# Patient Record
Sex: Male | Born: 1945 | Race: Black or African American | Hispanic: No | Marital: Married | Smoking: Former smoker
Health system: Southern US, Community
[De-identification: ages and names within clinical notes are randomized; demographics above are authoritative.]

## PROBLEM LIST (undated history)

## (undated) DIAGNOSIS — I10 Essential (primary) hypertension: Secondary | ICD-10-CM

## (undated) DIAGNOSIS — M1990 Unspecified osteoarthritis, unspecified site: Secondary | ICD-10-CM

## (undated) DIAGNOSIS — K5731 Diverticulosis of large intestine without perforation or abscess with bleeding: Secondary | ICD-10-CM

## (undated) DIAGNOSIS — E119 Type 2 diabetes mellitus without complications: Secondary | ICD-10-CM

## (undated) DIAGNOSIS — H269 Unspecified cataract: Secondary | ICD-10-CM

## (undated) DIAGNOSIS — K922 Gastrointestinal hemorrhage, unspecified: Secondary | ICD-10-CM

## (undated) DIAGNOSIS — Z972 Presence of dental prosthetic device (complete) (partial): Secondary | ICD-10-CM

## (undated) DIAGNOSIS — I639 Cerebral infarction, unspecified: Secondary | ICD-10-CM

## (undated) DIAGNOSIS — D649 Anemia, unspecified: Secondary | ICD-10-CM

## (undated) DIAGNOSIS — N400 Enlarged prostate without lower urinary tract symptoms: Secondary | ICD-10-CM

## (undated) DIAGNOSIS — R0600 Dyspnea, unspecified: Secondary | ICD-10-CM

## (undated) DIAGNOSIS — E785 Hyperlipidemia, unspecified: Secondary | ICD-10-CM

## (undated) DIAGNOSIS — Z789 Other specified health status: Secondary | ICD-10-CM

## (undated) DIAGNOSIS — Z973 Presence of spectacles and contact lenses: Secondary | ICD-10-CM

## (undated) DIAGNOSIS — K635 Polyp of colon: Secondary | ICD-10-CM

## (undated) HISTORY — PX: JOINT REPLACEMENT: SHX530

## (undated) HISTORY — DX: Hyperlipidemia, unspecified: E78.5

## (undated) HISTORY — DX: Polyp of colon: K63.5

## (undated) HISTORY — DX: Type 2 diabetes mellitus without complications: E11.9

## (undated) HISTORY — DX: Unspecified cataract: H26.9

## (undated) HISTORY — DX: Unspecified osteoarthritis, unspecified site: M19.90

## (undated) HISTORY — PX: CATARACT EXTRACTION: SUR2

## (undated) HISTORY — DX: Cerebral infarction, unspecified: I63.9

## (undated) HISTORY — DX: Essential (primary) hypertension: I10

## (undated) HISTORY — PX: KNEE CARTILAGE SURGERY: SHX688

## (undated) HISTORY — DX: Diverticulosis of large intestine without perforation or abscess with bleeding: K57.31

---

## 2005-04-16 HISTORY — PX: HERNIA REPAIR: SHX51

## 2005-04-16 HISTORY — PX: TOTAL HIP ARTHROPLASTY: SHX124

## 2005-08-08 ENCOUNTER — Inpatient Hospital Stay (HOSPITAL_COMMUNITY): Admission: RE | Admit: 2005-08-08 | Discharge: 2005-08-11 | Payer: Self-pay | Admitting: Orthopedic Surgery

## 2005-10-22 ENCOUNTER — Ambulatory Visit (HOSPITAL_COMMUNITY): Admission: RE | Admit: 2005-10-22 | Discharge: 2005-10-22 | Payer: Self-pay | Admitting: General Surgery

## 2007-11-27 ENCOUNTER — Encounter: Payer: Self-pay | Admitting: Family Medicine

## 2008-10-27 ENCOUNTER — Ambulatory Visit: Payer: Self-pay | Admitting: Family Medicine

## 2008-10-27 DIAGNOSIS — E119 Type 2 diabetes mellitus without complications: Secondary | ICD-10-CM

## 2008-10-27 DIAGNOSIS — I10 Essential (primary) hypertension: Secondary | ICD-10-CM

## 2008-10-27 DIAGNOSIS — M1990 Unspecified osteoarthritis, unspecified site: Secondary | ICD-10-CM | POA: Insufficient documentation

## 2008-10-27 DIAGNOSIS — E1165 Type 2 diabetes mellitus with hyperglycemia: Secondary | ICD-10-CM | POA: Insufficient documentation

## 2008-10-27 HISTORY — DX: Type 2 diabetes mellitus without complications: E11.9

## 2008-10-27 HISTORY — DX: Essential (primary) hypertension: I10

## 2009-01-26 ENCOUNTER — Ambulatory Visit: Payer: Self-pay | Admitting: Family Medicine

## 2009-01-26 LAB — CONVERTED CEMR LAB: Hgb A1c MFr Bld: 5.6 % (ref 4.6–6.5)

## 2009-04-27 ENCOUNTER — Ambulatory Visit: Payer: Self-pay | Admitting: Family Medicine

## 2009-04-27 DIAGNOSIS — E785 Hyperlipidemia, unspecified: Secondary | ICD-10-CM | POA: Insufficient documentation

## 2009-04-27 HISTORY — DX: Hyperlipidemia, unspecified: E78.5

## 2009-07-20 ENCOUNTER — Ambulatory Visit: Payer: Self-pay | Admitting: Family Medicine

## 2009-07-21 LAB — CONVERTED CEMR LAB
Albumin: 4.2 g/dL (ref 3.5–5.2)
Bilirubin, Direct: 0.1 mg/dL (ref 0.0–0.3)
Chloride: 105 meq/L (ref 96–112)
Cholesterol: 201 mg/dL — ABNORMAL HIGH (ref 0–200)
Hgb A1c MFr Bld: 5.4 % (ref 4.6–6.5)
VLDL: 8.8 mg/dL (ref 0.0–40.0)

## 2009-07-27 ENCOUNTER — Ambulatory Visit: Payer: Self-pay | Admitting: Family Medicine

## 2010-03-01 ENCOUNTER — Telehealth: Payer: Self-pay | Admitting: Family Medicine

## 2010-05-16 NOTE — Assessment & Plan Note (Signed)
Summary: 3 MNTH ROV//SLM   Vital Signs:  Patient profile:   65 year old male Weight:      249 pounds Temp:     98.4 degrees F BP sitting:   120 / 82  History of Present Illness: Followup medical problems. Patient has type 2 diabetes and fasting blood sugars been excellent mostly below 90. No hypoglycemia. On metformin 500 mg b.i.d. Last A1c 5.6%. Exercising 6 days per week. Weight stable and still losing inches around the waist. Has lost over 4 inches in waist circumference this year. Blood sugars consistently stable. He would like to consider stopping medication this point if possible.  Hypertension treated with lisinopril 10 mg. No history of significant proteinuria. We reduced medication last visit. Blood pressures have been stable. Denies any dizziness or orthostatic symptoms. No side effects from medication.  Mild  hyperlipidemia.  Last lipids in 2009.  No lipids since his weight loss and lifestyle changes.  Allergies: No Known Drug Allergies  Past History:  Past Medical History: Last updated: 10/27/2008 Hypertension Diabetes  Past Surgical History: Last updated: 10/27/2008 Hip replacement 4/07 Hernia 8/07  Social History: Last updated: 10/27/2008 Retired Married Alcohol use-no Previous smoker Regular exercise-yes  Review of Systems  The patient denies anorexia, fever, chest pain, syncope, dyspnea on exertion, peripheral edema, prolonged cough, headaches, hemoptysis, abdominal pain, melena, hematochezia, severe indigestion/heartburn, and incontinence.    Physical Exam  General:  Well-developed,well-nourished,in no acute distress; alert,appropriate and cooperative throughout examination Head:  Normocephalic and atraumatic without obvious abnormalities. No apparent alopecia or balding. Eyes:  No corneal or conjunctival inflammation noted. EOMI. Perrla. Funduscopic exam benign, without hemorrhages, exudates or papilledema. Vision grossly normal. Ears:  External ear  exam shows no significant lesions or deformities.  Otoscopic examination reveals clear canals, tympanic membranes are intact bilaterally without bulging, retraction, inflammation or discharge. Hearing is grossly normal bilaterally. Mouth:  Oral mucosa and oropharynx without lesions or exudates.  Teeth in good repair. Neck:  No deformities, masses, or tenderness noted. Lungs:  Normal respiratory effort, chest expands symmetrically. Lungs are clear to auscultation, no crackles or wheezes. Heart:  Normal rate and regular rhythm. S1 and S2 normal without gallop, murmur, click, rub or other extra sounds. Extremities:  No clubbing, cyanosis, edema, or deformity noted with normal full range of motion of all joints.     Impression & Recommendations:  Problem # 1:  HYPERTENSION (ICD-401.9) discontinue lisinopril and recheck blood pressure 3 months The following medications were removed from the medication list:    Lisinopril 10 Mg Tabs (Lisinopril) ..... One by mouth once daily  Problem # 2:  DIAB W/O COMP TYPE II/UNS NOT STATED UNCNTRL (ICD-250.00) we'll discontinue metformin and recheck A1c in 3 months The following medications were removed from the medication list:    Lisinopril 10 Mg Tabs (Lisinopril) ..... One by mouth once daily    Metformin Hcl 500 Mg Tabs (Metformin hcl) .Marland Kitchen... 2 by mouth q am and 1 by mouth q pm  Problem # 3:  HYPERLIPIDEMIA (ICD-272.4) recheck lipids at fasting labs in 3 months.  Patient Instructions: 1)  Please schedule a follow-up appointment in 3 months .  2)  BMP prior to visit, ICD-9: 401.9 3)  Hepatic Panel prior to visit ICD-9: 272.4 4)  Lipid panel prior to visit ICD-9 : 272.4 5)  HgBA1c prior to visit  ICD-9: 250.00 6)  It is important that you exercise reguarly at least 20 minutes 5 times a week. If you develop chest  pain, have severe difficulty breathing, or feel very tired, stop exercising immediately and seek medical attention.  7)  Check your blood sugars  regularly. If your readings are usually above:  or below 70 you should contact our office.  8)  See your eye doctor yearly to check for diabetic eye damage.

## 2010-05-16 NOTE — Progress Notes (Signed)
Summary: Call place to pt, explain form faxed?  Phone Note Outgoing Call   Call placed by: Sid Falcon LPN,  March 01, 2010 5:07 PM Call placed to: Patient Summary of Call: Received a faxed form, unsure what to do with it.  Wanting to ask pt if he knows about it.  Looks like a request for medical redords?  We do not have permission from him, release? Initial call taken by: Sid Falcon LPN,  March 01, 2010 5:09 PM  Follow-up for Phone Call        [facility] Sid Falcon LPN  March 02, 2010 10:50 AM   Pt did call back, EMSI does not mean anything to him either.  He did apply for additional life insurance and he will call them to see if they faxed something to Dr Caryl Never.  Discarded the form, we need written authorization to release medical records. Follow-up by: Sid Falcon LPN,  March 02, 2010 4:29 PM     Appended Document: Call place to pt, explain form faxed? Pt did call his insurance Co and we did received the form he needs for medical records.  Pt will come by office and sign a release of medical records form.  Medical records will  fax records.

## 2010-05-16 NOTE — Assessment & Plan Note (Signed)
Summary: 3 month rov/njr   Vital Signs:  Patient profile:   65 year old male Height:      72.25 inches Weight:      226 pounds Temp:     98.6 degrees F oral BP sitting:   120 / 82  (left arm) Cuff size:   large  Vitals Entered By: Sid Falcon LPN (July 27, 2009 8:30 AM) CC: 3 month follow-up   History of Present Illness: Patient for followup. Prior history of hyperlipidemia, hypertension, and type 2 diabetes. Tremendous job with lifestyle modification. Has lost over 60 pounds over the past 2 years and is exercising 2 and one half hours per day and has come off all medications. No recent urine frequency or thirst. Blood pressure stable. Feels very good overall. Positive dietary changes as well.  Recent labs reviewed hemoglobin A1c 5.4%. Good lipid profile with HDL 66. Total cholesterol 201. Normal triglycerides. Glucose 83  Allergies (verified): No Known Drug Allergies  Past History:  Past Medical History: Last updated: 10/27/2008 Hypertension Diabetes  Past Surgical History: Last updated: 10/27/2008 Hip replacement 4/07 Hernia 8/07  Review of Systems  The patient denies anorexia, chest pain, syncope, dyspnea on exertion, peripheral edema, prolonged cough, headaches, hemoptysis, abdominal pain, and melena.    Physical Exam  General:  Well-developed,well-nourished,in no acute distress; alert,appropriate and cooperative throughout examination Neck:  No deformities, masses, or tenderness noted. Lungs:  Normal respiratory effort, chest expands symmetrically. Lungs are clear to auscultation, no crackles or wheezes. Heart:  Normal rate and regular rhythm. S1 and S2 normal without gallop, murmur, click, rub or other extra sounds.   Impression & Recommendations:  Problem # 1:  HYPERTENSION (ICD-401.9) Assessment Improved basically his chronic medical problems have resolved with tremendous lifestyle change.  Problem # 2:  DIAB W/O COMP TYPE II/UNS NOT STATED UNCNTRL  (ICD-250.00) Assessment: Improved no indication for any further meds.

## 2010-09-01 NOTE — Op Note (Signed)
NAMESHED, NIXON             ACCOUNT NO.:  [id number]   MEDICAL RECORD NO.:  [id number]          PATIENT TYPE:  AMB   LOCATION:  DAY                          FACILITY:  [facility]   PHYSICIAN:  Gita Kudo, M.D. DATE OF BIRTH:  [birth year]   DATE OF PROCEDURE:  10/22/2005  DATE OF DISCHARGE:                                 OPERATIVE REPORT   OPERATIVE PROCEDURE:  Repair umbilical hernia with mesh - Ventralex, 8 cm.   SURGEON:  Gita Kudo, M.D.   ANESTHESIA:  General.   PREOPERATIVE DIAGNOSIS:  Umbilical hernia.   POSTOPERATIVE DIAGNOSIS:  Umbilical hernia.   CLINICAL SUMMARY:  65 year old male with symptomatic enlarging umbilical  hernia who comes in for elective repair.  He was seen in the office several  months ago but was on Coumadin for phlebitis and we decided to make sure he  had his full 6 months before we treated him.  His Coumadin and aspirin were  stopped 5 days before surgery.   OPERATIVE FINDINGS:  The patient is a large umbilical hernia with about a 4  cm defect.  The 8 cm diameter Ventralex mesh was used.   OPERATIVE PROCEDURE:  Under satisfactory general endotracheal anesthesia,  having received 1.0 grams Ancef preop, the patient was prepped, positioned  and draped in standard fashion.  A total of 30 mL of 0.5% Marcaine with  epinephrine was infiltrated for postop analgesia.  A curved infraumbilical  incision was made and carried down to the fascia.  The umbilical skin was  elevated off the abdominal wall and the hernia sac freed in all directions.  The hernia sac - fascial junction was then circumscribed with a cautery into  the preperitoneal space.  Finger dissection used to reduce the preperitoneal  contents, some of which were actually up and stuck in the hernia sac.  Then  with good exposure, the 8 cm piece of Ventralex mesh was placed in the  preperitoneal space.  It was anchored with four quadrant sutures of zero  Prolene through the  undersurface of the abdominal wall and the upper surface  of the mesh.  After tied, the mesh was in good position.  Then four  additional sutures were used in between the others to make a total of eight  sutures around the periphery.  This held the mesh in excellent position.  Following this the wound was lavaged with saline and the remainder of the  Marcaine infiltrated.  The fascial defect was then closed in a transverse  direction with interrupted figure-of-eight 0 Prolene suture taking  intermittent bites of the mesh again holding it in  good position.  The wound was then checked for hemostasis, made dry by  cautery, lavaged with saline and closed in layers with 2-0 Vicryl, 3-0  Vicryl, Steri-Strips.  Sterile absorbent dressings were then applied.  The  patient went to the recovery room from the operating room in good condition.           ______________________________  Gita Kudo, M.D.     MRL/MEDQ  D:  10/22/2005  T:  10/22/2005  Job:  [id number]   cc:   Teena Irani. Arlyce Dice, M.D.  Fax: [phone]

## 2010-09-01 NOTE — Discharge Summary (Signed)
Carl Lam, Carl Lam             ACCOUNT NO.:  [id number]   MEDICAL RECORD NO.:  [id number]          PATIENT TYPE:  INP   LOCATION:  1619                         FACILITY:  [facility]   PHYSICIAN:  Georges Lynch. Gioffre, M.D.DATE OF BIRTH:  1945-05-05   DATE OF ADMISSION:  08/08/2005  DATE OF DISCHARGE:  08/11/2005                                 DISCHARGE SUMMARY   ADMITTING DIAGNOSES:  1.  End-stage osteoarthritis right hip.  2.  History of umbilical hernia.   DISCHARGE DIAGNOSES:  1.  Right total hip arthroplasty.  2.  Asymptomatic postoperative blood loss anemia.  3.  History of umbilical hernia.   HISTORY OF PRESENT ILLNESS:  Patient is a 65 year old male with severe pain  and loss of range of motion of his right hip.  He has difficulty with  ambulating, getting up and down stairs.  Presents to our office for  evaluation which shows end-stage osteoarthritis with deformed acetabular and  femoral head components.   ALLERGIES:  No known drug allergies.   CURRENT MEDICATIONS ON ADMISSION:  Vicodin on a p.r.n. basis.   SURGICAL PROCEDURE:  On August 08, 2005 patient was taken to the OR by Dr.  Worthy Rancher assisted by Dr. Lajoyce Corners.  Under general anesthesia underwent a  right total hip arthroplasty without any complications.  Patient tolerated  the procedure well.  Had the following components implanted.  A sized 7 high  offset tapered hip stem with Duofix HA.  A size +5 tapered sleeve adaptor.  A size 49 femoral implant.  A size 56 outside diameter acetabular cup.  Patient tolerated the procedure, was transferred to the recovery room and  then the orthopedic floor for routine total hip protocol.   CONSULTS:  The following routine consult were requested:  Physical therapy,  occupational therapy, case management, pharmacy for Coumadin management.   HOSPITAL COURSE:  On August 08, 2005 patient was admitted to [facility] under the care of Dr. Worthy Rancher.  Patient was taken  to the OR  where a right total hip arthroplasty was performed without any  complications.  Patient was transferred to the recovery room and to the  orthopedic floor in good condition on IV medications, antibiotics, pain  medications, and Coumadin and heparin for DVT prophylaxis and started on a  routine total hip protocol.   Patient then incurred a total of three days postoperative care which the  patient incurred no significant medical issues.  Patient's vital signs  remained stable.  He remained afebrile.  His hemoglobin did drop to 9.8 but  patient tolerated that well with his vital signs and his activity with  physical therapy.  His wound remained benign for any signs of infection.  His leg remained neuro/motor vascularly intact.  Patient worked well with  physical therapy and it was felt that on postoperative day #3 he was  orthopedically and medically stable and was ready for discharge home for  outpatient home health physical therapy for total hip protocol.  Patient was  discharged in good condition.   LABORATORIES:  H&H on April 28 hemoglobin 9.8,  hematocrit 29.7 with an INR  of 1.4.  Routine chemistries on April 28 found sodium 138, potassium 4.3,  glucose 256 with no indications and changes in his diabetic regime which was  Glipizide which was started just prior to his admission.  BUN 11, creatinine  1.1.  Routine urinalysis on prior to admission found glucose greater than  1000 and his preoperative glucose was 285 which was noted prior to surgery  and the patient was evaluated by his primary care physician prior to surgery  and started him on Glipizide within two days of his surgery which would  explain for his elevated glucose.  Otherwise, he was a previously non-  diagnosed diabetic.   DISCHARGE MEDICATIONS:  1.  Ferrous sulfate 325 mg p.o. t.i.d.  2.  Percocet one or two tablets every four to six hours p.r.n.  3.  Reglan 10 mg p.o. q.8h. p.r.n.  4.  Colace 100 mg p.o.  b.i.d.  5.  Glipizide 5 mg p.o. daily.  6.  Coumadin 10 mg a day.  7.  Heparin 5000 units subcutaneous q.12h. until discharged.  8.  Routine insulin sliding scale daily.   DISCHARGE INSTRUCTIONS:   DIET:  Restricted 1800 calorie diabetic diet.   ACTIVITY:  Patient is to ambulate with the use of a walker as directed by  physical therapy.   WOUND CARE:  Patient is to change dressing daily.   MEDICATIONS:  Patient is to continue medications as dispensed prior to his  admission into the hospital including Glipizide and Coumadin at 10 mg dose  on the date of discharge, 7.5 mg the day after discharge, and then 5 mg the  day thereafter unless changed by pharmacy.   FOLLOW-UP:  With Dr. Darrelyn Hillock [phone] extension [phone] for two weeks from date  of surgery.  Home health with Genevieve Norlander for pro time and physical therapy.   Patient had a previous follow-up appointment arranged with his primary care  physician to monitor the recent diagnosis of diabetes.   Patient's condition upon discharge to home is listed as improved and good.      [name], P.A.    ______________________________  Georges Lynch Darrelyn Hillock, M.D.    RWK/MEDQ  D:  08/20/2005  T:  08/21/2005  Job:  [id number]

## 2010-09-01 NOTE — Op Note (Signed)
Carl Lam, Carl Lam             ACCOUNT NO.:  [id number]   MEDICAL RECORD NO.:  [id number]          PATIENT TYPE:  INP   LOCATION:  0007                         FACILITY:  [facility]   PHYSICIAN:  Georges Lynch. Gioffre, M.D.DATE OF BIRTH:  11/11/1945   DATE OF PROCEDURE:  08/08/2005  DATE OF DISCHARGE:                                 OPERATIVE REPORT   PREOPERATIVE DIAGNOSIS:  Degenerative arthritis right hip, severe.   POSTOPERATIVE DIAGNOSIS:  Degenerative arthritis right hip, severe.   OPERATION:  Right total hip arthroplasty utilizing the DePuy system.  The  acetabular cup was an ASR size 56.  The unifemoral head was a size 49.  The  stem was a size 7 high offset and the taper sleeve was a +5.   SURGEON:  Georges Lynch. Darrelyn Hillock, M.D.   ASSISTANT:  Madlyn Frankel. Charlann Boxer, M.D.   DESCRIPTION OF PROCEDURE:  Under general anesthesia, routine orthopedic prep  and draping of the right hip was carried out.  Patient had 1 g IV Ancef  preoperatively.  A posterolateral approach of the hip was carried out with  the patient on his left side, right side up.  Bleeders were identified and  cauterized.  Self-retaining retractors were inserted.  The incision was  carried down to the iliotibial band.  We then incised the iliotibial band  and then incised the gluteal muscle by blunt dissection.  Great care was  taken not to injure the underlying nerve.  We then advanced the retractors.  I partially detached the external rotators.  I detached the piriformis.  I  then did a capsulectomy.  The bony overgrowth of the acetabulum was so  severe that we had to take an osteotome first and do a partial ostectomy of  the acetabulum.  We then were able to dislocate the femoral head.  We  amputated the femoral head at the appropriate neck length utilizing the  guide.  Following this, we then reamed and rasped out the femoral canal to a  size for a Summit prosthesis.  Once this was carried out, we then directed  attention  to the acetabulum, reamed the acetabulum up to the appropriate  size for a size 56 mm ASR metal cup.  We irrigated out the acetabulum after  we completed the removal of the spurs.  We then inserted our permanent cup  in the usual fashion.  We then went through trials for leg length and  position.  We then inserted our permanent size 7 Summit stem and went  through trials once again with a +2 and a +5. We felt the +5 C tapered head  component was the most stable.  We then inserted our permanent 549  unifemoral component and reduced the hip after we made sure there were no  loose fragments in the acetabulum.  We had excellent function of the hip,  excellent leg length.  The hip was stable.  We thoroughly irrigated out the  area and closed the wound in layers in usual fashion.  Skin was closed with  metal staples.  Sterile neosporin dressing was applied.  ______________________________  Georges Lynch Darrelyn Hillock, M.D.     RAG/MEDQ  D:  08/08/2005  T:  08/09/2005  Job:  [id number]

## 2010-09-01 NOTE — H&P (Signed)
Carl Lam, DOYON             ACCOUNT NO.:  [id number]   MEDICAL RECORD NO.:  [id number]          Carl Lam TYPE:  INP   LOCATION:  NA                           FACILITY:  [facility]   PHYSICIAN:  Georges Lynch. Gioffre, M.D.DATE OF BIRTH:  [birth year]   DATE OF ADMISSION:  08/08/2005  DATE OF DISCHARGE:                                HISTORY & PHYSICAL   CHIEF COMPLAINT:  Painful loss of range of motion right hip.   HISTORY OF PRESENT ILLNESS:  Carl Lam is a 65 year old black male with severe  pain and loss of range of motion of his right hip.  Carl Lam has difficulty  ambulating, difficulty getting up and down stairs.  Presented to our office.  Evaluation shows complete end-stage osteoarthritis with deformed acetabular  and femoral head on the right hip.   ALLERGIES:  NO KNOWN DRUG ALLERGIES.   CURRENT MEDICATION:  Vicodin on p.r.n. basis.   PAST MEDICAL HISTORY:  1.  Currently has a small umbilical hernia.  2.  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  3.  Otherwise healthy.   PAST SURGICAL HISTORY:  Left knee open ligament repair without any  complications.   SOCIAL HISTORY:  Carl Lam is married, healthy.  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  Carl Lam  denies any smoking.  Carl Lam stopped 20 years ago.  Carl Lam denies any alcohol use.  Carl Lam stopped 22 years ago.  Carl Lam has four children.  Lives with his wife in a  house, two stories.   FAMILY HISTORY:  Mother is alive at 62 years of age, good health.  Father is  deceased from cardiac and ETOH issues.  One brother alive in good health.  One sister alive in good health.   PRIMARY CARE PHYSICIAN:  Evelena Peat, M.D., [facility].   REVIEW OF SYSTEMS:  Positive for occasional weak urinary stream, positive  for right hip pain with range of motion, loss of motion.  Carl Lam denies any  neurologic, pulmonary, cardiovascular, GI, GU, hematologic or endocrine  issues at this time.   PHYSICAL EXAMINATION:  GENERAL APPEARANCE:  This is a healthy  appearing,  well-developed 65 year old black male, conscious, alert and appropriate.  Ambulates with moderate right-sided limp.  Easily gets himself on and off  the exam table.  VITAL SIGNS:  Carl Lam is 6 foot 3 inches, 245 pounds, blood pressure 138/94,  pulse 76, respirations 12, Carl Lam is afebrile.  HEENT:  Head is normocephalic.  Pupils are equal, round and reactive to  light.  Extraocular movements intact.  Gross hearing is intact.  Oral buccal  mucosa is pink and moist.  NECK:  Supple.  No palpable lymphadenopathy.  Carl Lam had good range of motion of  his cervical spine without any difficulty.  CHEST:  Lung sounds were clear and equal bilaterally.  No wheezing, rhonchi  or rales.  CARDIOVASCULAR:  Regular rate and rhythm, no murmurs, rubs, or gallops.  ABDOMEN:  Soft and nontender.  Carl Lam did have a small protuberance at the  umbilicus.  Carl Lam had no CVA region tenderness.  EXTREMITIES:  Upper extremities were symmetrically sized and shaped.  Carl Lam had  good range of motion of his shoulders, elbows and wrists.   Lower extremities:  Left hip had full extension, flexion easily up to 130  degrees, 20 degrees internal and external rotation without any discomfort.  Right hip had full extension, flexion up to 110 degrees very slowly.  Carl Lam had  no internal or external rotation.  Both knees were symmetrical.  Left knee  had a well healed incision on it.  Carl Lam had full extension, flexion back to  110 degrees, no instability.  The calves are soft and nontender.  The ankles  were symmetrical with good dorsiflexion and plantar flexion.  PERIPHERAL VASCULAR:  Carotid pulses were 2+, no bruits.  Radial pulses were  2+, posterior tibial pulses were 1+.  Carl Lam had no lower extremity edema .  NEUROLOGIC:  Carl Lam was conscious, alert and appropriate.  Good historian.  No gross neurologic defects noted.  BREASTS/RECTAL/GU:  Examinations were deferred at this time.   IMPRESSION:  1.  End-stage osteoarthritis with  large osteophyte deformities of right hip.  2.  History of umbilical hernia.   DISPOSITION:  At this time, Carl Lam has been preevaluated by Dr. Caryl Never  for this surgical procedure and has been cleared for this upcoming  procedure.  Carl Lam will undergo all routine labs and tests prior to having  a right total hip arthroplasty on August 08, 2005, by Dr. Darrelyn Hillock.  Metal on  metal.      [name], P.A.    ______________________________  Georges Lynch Darrelyn Hillock, M.D.    RWK/MEDQ  D:  07/30/2005  T:  07/30/2005  Job:  [id number]

## 2011-09-17 DIAGNOSIS — H2510 Age-related nuclear cataract, unspecified eye: Secondary | ICD-10-CM | POA: Diagnosis not present

## 2011-10-11 ENCOUNTER — Encounter: Payer: Self-pay | Admitting: Family Medicine

## 2011-10-11 ENCOUNTER — Ambulatory Visit (INDEPENDENT_AMBULATORY_CARE_PROVIDER_SITE_OTHER): Payer: Medicare Other | Admitting: Family Medicine

## 2011-10-11 ENCOUNTER — Inpatient Hospital Stay (HOSPITAL_COMMUNITY)
Admission: AD | Admit: 2011-10-11 | Discharge: 2011-10-14 | DRG: 378 | Disposition: A | Discharge status: Home / Self Care | Payer: Medicare Other | Source: Ambulatory Visit | Attending: Internal Medicine | Admitting: Internal Medicine

## 2011-10-11 ENCOUNTER — Encounter (HOSPITAL_COMMUNITY): Payer: Self-pay | Admitting: General Practice

## 2011-10-11 VITALS — BP 100/60 | HR 104 | Temp 97.8°F | Resp 12 | Wt 242.0 lb

## 2011-10-11 DIAGNOSIS — N289 Disorder of kidney and ureter, unspecified: Secondary | ICD-10-CM | POA: Diagnosis present

## 2011-10-11 DIAGNOSIS — K5731 Diverticulosis of large intestine without perforation or abscess with bleeding: Secondary | ICD-10-CM | POA: Diagnosis not present

## 2011-10-11 DIAGNOSIS — R55 Syncope and collapse: Secondary | ICD-10-CM | POA: Diagnosis not present

## 2011-10-11 DIAGNOSIS — E1165 Type 2 diabetes mellitus with hyperglycemia: Secondary | ICD-10-CM | POA: Diagnosis present

## 2011-10-11 DIAGNOSIS — E119 Type 2 diabetes mellitus without complications: Secondary | ICD-10-CM | POA: Diagnosis present

## 2011-10-11 DIAGNOSIS — K449 Diaphragmatic hernia without obstruction or gangrene: Secondary | ICD-10-CM | POA: Diagnosis present

## 2011-10-11 DIAGNOSIS — Z87891 Personal history of nicotine dependence: Secondary | ICD-10-CM | POA: Diagnosis not present

## 2011-10-11 DIAGNOSIS — I10 Essential (primary) hypertension: Secondary | ICD-10-CM | POA: Diagnosis present

## 2011-10-11 DIAGNOSIS — M1990 Unspecified osteoarthritis, unspecified site: Secondary | ICD-10-CM | POA: Diagnosis present

## 2011-10-11 DIAGNOSIS — D62 Acute posthemorrhagic anemia: Secondary | ICD-10-CM | POA: Diagnosis not present

## 2011-10-11 DIAGNOSIS — I959 Hypotension, unspecified: Secondary | ICD-10-CM | POA: Diagnosis present

## 2011-10-11 DIAGNOSIS — E785 Hyperlipidemia, unspecified: Secondary | ICD-10-CM | POA: Diagnosis present

## 2011-10-11 DIAGNOSIS — K922 Gastrointestinal hemorrhage, unspecified: Secondary | ICD-10-CM | POA: Diagnosis not present

## 2011-10-11 DIAGNOSIS — R000 Tachycardia, unspecified: Secondary | ICD-10-CM | POA: Diagnosis present

## 2011-10-11 DIAGNOSIS — Z7982 Long term (current) use of aspirin: Secondary | ICD-10-CM | POA: Diagnosis not present

## 2011-10-11 DIAGNOSIS — K921 Melena: Secondary | ICD-10-CM

## 2011-10-11 DIAGNOSIS — Z96649 Presence of unspecified artificial hip joint: Secondary | ICD-10-CM

## 2011-10-11 DIAGNOSIS — E669 Obesity, unspecified: Secondary | ICD-10-CM | POA: Diagnosis present

## 2011-10-11 DIAGNOSIS — I951 Orthostatic hypotension: Secondary | ICD-10-CM | POA: Diagnosis not present

## 2011-10-11 HISTORY — DX: Gastrointestinal hemorrhage, unspecified: K92.2

## 2011-10-11 LAB — CBC
HCT: 26.7 % — ABNORMAL LOW (ref 39.0–52.0)
Hemoglobin: 7.7 g/dL — ABNORMAL LOW (ref 13.0–17.0)
MCH: 30.1 pg (ref 26.0–34.0)
MCHC: 34.8 g/dL (ref 30.0–36.0)
MCV: 87.1 fL (ref 78.0–100.0)
Platelets: 223 10*3/uL (ref 150–400)
Platelets: 180 10*3/uL (ref 150–400)
RBC: 3.11 MIL/uL — ABNORMAL LOW (ref 4.22–5.81)
RBC: 2.56 MIL/uL — ABNORMAL LOW (ref 4.22–5.81)
WBC: 10.5 10*3/uL (ref 4.0–10.5)
WBC: 9.1 10*3/uL (ref 4.0–10.5)

## 2011-10-11 LAB — POCT HEMOGLOBIN: Hemoglobin: 9.1 g/dL — AB (ref 14.1–18.1)

## 2011-10-11 LAB — COMPREHENSIVE METABOLIC PANEL
Alkaline Phosphatase: 45 U/L (ref 39–117)
BUN: 27 mg/dL — ABNORMAL HIGH (ref 6–23)
Chloride: 104 mEq/L (ref 96–112)
GFR calc Af Amer: 61 mL/min — ABNORMAL LOW (ref >90)
Glucose, Bld: 156 mg/dL — ABNORMAL HIGH (ref 70–99)
Sodium: 136 mEq/L (ref 135–145)
Total Bilirubin: 0.3 mg/dL (ref 0.3–1.2)
Total Protein: 6.4 g/dL (ref 6.0–8.3)

## 2011-10-11 LAB — DIFFERENTIAL: Eosinophils Absolute: 0 10*3/uL (ref 0.0–0.7)

## 2011-10-11 LAB — PROTIME-INR
INR: 1.15 (ref 0.00–1.49)
Prothrombin Time: 14.9 seconds (ref 11.6–15.2)

## 2011-10-11 MED ORDER — PEG-KCL-NACL-NASULF-NA ASC-C 100 G PO SOLR
1.0000 | Freq: Once | ORAL | Status: DC
  Filled 2011-10-11: qty 1

## 2011-10-11 MED ORDER — PANTOPRAZOLE SODIUM 40 MG PO TBEC
40.0000 mg | DELAYED_RELEASE_TABLET | Freq: Every day | ORAL | Status: DC
  Administered 2011-10-12 – 2011-10-14 (×3): 40 mg via ORAL
  Filled 2011-10-11 (×3): qty 1

## 2011-10-11 MED ORDER — PEG-KCL-NACL-NASULF-NA ASC-C 100 G PO SOLR
0.5000 | ORAL | Status: AC
  Administered 2011-10-11 – 2011-10-12 (×2): 50 g via ORAL
  Filled 2011-10-11 (×2): qty 1

## 2011-10-11 MED ORDER — SODIUM CHLORIDE 0.9 % IJ SOLN: 3.0000 mL | Freq: Two times a day (BID) | INTRAMUSCULAR | Status: DC

## 2011-10-11 MED ORDER — SODIUM CHLORIDE 0.9 % IV SOLN
INTRAVENOUS | Status: DC
  Administered 2011-10-11 – 2011-10-12 (×2): via INTRAVENOUS

## 2011-10-11 NOTE — Care Management Note (Unsigned)
    Page 1 of 1   10/11/2011     11:52:48 AM   CARE MANAGEMENT NOTE 10/11/2011  Patient:  Carl Lam, Carl Lam   Account Number:  [id number]  Date Initiated:  10/11/2011  Documentation initiated by:  GRAVES-BIGELOW,Pansy Ostrovsky  Subjective/Objective Assessment:   Pt admitted with syncope and lower GI bleed. Pt has family support. GI to consult.     Action/Plan:   CM will continue to monitor for diposition needs.   Anticipated DC Date:  10/13/2011   Anticipated DC Plan:  HOME/SELF CARE      DC Planning Services  CM consult      Choice offered to / List presented to:             Status of service:  In process, will continue to follow Medicare Important Message given?   (If response is "NO", the following Medicare IM given date fields will be blank) Date Medicare IM given:   Date Additional Medicare IM given:    Discharge Disposition:    Per UR Regulation:  Reviewed for med. necessity/level of care/duration of stay  If discussed at Long Length of Stay Meetings, dates discussed:    Comments:

## 2011-10-11 NOTE — H&P (Signed)
[facility] History and Physical  THELMA VIANA ZOX:[id number] DOB: 12/01/1945 DOA: 10/11/2011   PCP: Kristian Covey, MD   Chief Complaint: Passing blood instead of stool   HPI:  66 year old man without any significant past medical history presented to his primary care physician complaining of having numerous bloody bowel movements. He was found to be tachycardic and hypotensive and was referred for admission. Patient denies any nausea vomiting or hematemesis. The daughter describes the patient's bleeding as-looking white male menstruation. Patient's last bowel movement was at 5 AM.   Review of Systems:  Denies any chest pain or shortness of breath denies any abdominal pain and never had a colonoscopy All other systems reviewed and per history of present illness were negative  Past Medical History  Diagnosis Date  . 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 HYPERLIPIDEMIA 04/27/2009    Qualifier: Diagnosis of  By: Caryl Never MD, Bruce    . HYPERTENSION 10/27/2008    Qualifier: Diagnosis of  By: Gabriel Rung LPN, Harriett Sine    . DEGENERATIVE JOINT DISEASE 10/27/2008    Qualifier: Diagnosis of  By: Caryl Never MD, Bruce     Past Surgical History  Procedure Date  . Hip surgery 2007    replacement  . Hernia repair    Social History:  reports that he quit smoking about 33 years ago. His smoking use included Cigarettes. He has a 4.5 pack-year smoking history. He does not have any smokeless tobacco history on file. His alcohol and drug histories not on file.  No Known Allergies  Family History  Problem Relation Age of Onset  . Alcohol abuse Other     grandparent  . Arthritis Other     arthritis  . Diabetes Other     grandparent    Prior to Admission medications   Medication Sig Start Date End Date Taking? Authorizing Provider  aspirin 325 MG tablet Take 325 mg by mouth daily.    Historical Provider, MD    Physical Exam: Filed Vitals:   10/11/11 1045 10/11/11 1046 10/11/11 1047  BP: 109/69 105/67 85/55  Pulse: 80 76 122  Temp: 97.8 F (36.6 C)    TempSrc: Oral    Resp: 15 16 18   SpO2: 100% 96% 100%     General:  Alert oriented x3  Eyes: Pupil equal round react to light accommodation extraocular movements intact  ENT: No pharyngeal erythema  Neck: No JVD  Cardiovascular: Regular rate and rhythm without murmur   Respiratory: Clear to auscultation bilaterally  Abdomen: Soft nontender  Skin: No rash  Musculoskeletal: Intact  Psychiatric: Euthymic  Neurologic: Cranial nerves 2-12 intact strength 5 out 5, sensation intact  Labs on Admission:  Basic Metabolic Panel: No results found for this basename: NA:5,K:5,CL:5,CO2:5,GLUCOSE:5,BUN:5,CREATININE:5,CALCIUM:5,MG:5,PHOS:5 in the last 168 hours Liver Function Tests: No results found for this basename: AST:5,ALT:5,ALKPHOS:5,BILITOT:5,PROT:5,ALBUMIN:5 in the last 168 hours No results found for this basename: LIPASE:5,AMYLASE:5 in the last 168 hours No results found for this basename: AMMONIA:5 in the last 168 hours CBC:  Lab 10/11/11 0854  WBC --  NEUTROABS --  HGB 9.1*  HCT --  MCV --  PLT --   Cardiac Enzymes: No results found for this basename: CKTOTAL:5,CKMB:5,CKMBINDEX:5,TROPONINI:5 in the last 168 hours BNP: No components found with this basename: POCBNP:5 CBG: No results found for this basename: GLUCAP:5 in the last 168 hours  Radiological Exams on Admission: No results found.  Assessment/Plan Principal Problem:  *GI bleed Active Problems:  DIAB W/O COMP TYPE II/UNS NOT STATED UNCNTRL  HYPERLIPIDEMIA  HYPERTENSION  DEGENERATIVE JOINT DISEASE   1. GI bleed-most likely lower GI bleed with component of hypotension tachycardia. Suspect diverticular bleed in the right colon. Bowel patient is having upper gastrointestinal bleeding but will keep that in mind as a possibility. If patient becomes more  likely unstable we'll move to the ICU. We'll start proton pump inhibitor, type and screen, obtain basic labs with CMET and CBC. Gastroenterology consultation with [facility] gastroenterology has been obtained.  Code Status: Full Family Communication: Daughter Disposition Plan: Home  Kinsey Karch, MD  [facility] Pager [phone]  If 7PM-7AM, please contact night-coverage [email or web address] Password [facility] 10/11/2011, 10:57 AM

## 2011-10-11 NOTE — Progress Notes (Signed)
UR Completed Devanee Pomplun Graves-Bigelow, RN,BSN [phone]  

## 2011-10-11 NOTE — Progress Notes (Signed)
  Subjective:    Patient ID: Carl Lam, male    DOB: 1945-12-03, 66 y.o.   MRN: [id number]  HPI  Acute visit. Syncopal episode this morning while ambulating to bathroom. Unsure of duration of loss of consciousness. Starting yesterday he has had about 3-4 episodes of maroon-colored semi-formed stool with strong odor. He noticed some dizziness starting yesterday an episode of syncope this morning. Denied any chest pain, abdominal pain, nausea, vomiting, dyspnea. No epigastric pain. Takes one aspirin daily but no other medication. No history of colonoscopy. No known history of diverticular disease.  Previously patient had type 2 diabetes and hypertension but with substantial weight loss and exercise his has been able to control these without any medications.  Past Medical History  Diagnosis Date  . 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 HYPERLIPIDEMIA 04/27/2009    Qualifier: Diagnosis of  By: Caryl Never MD, Makinlee Awwad    . HYPERTENSION 10/27/2008    Qualifier: Diagnosis of  By: Gabriel Rung LPN, Harriett Sine    . DEGENERATIVE JOINT DISEASE 10/27/2008    Qualifier: Diagnosis of  By: Caryl Never MD, Tyheim Vanalstyne     Past Surgical History  Procedure Date  . Hip surgery 2007    replacement  . Hernia repair     reports that he quit smoking about 33 years ago. His smoking use included Cigarettes. He has a 4.5 pack-year smoking history. He does not have any smokeless tobacco history on file. His alcohol and drug histories not on file. family history includes Alcohol abuse in his other; Arthritis in his other; and Diabetes in his other. No Known Allergies    Review of Systems  Constitutional: Negative for fever, chills, appetite change and unexpected weight change.  Respiratory: Negative for cough and shortness of breath.   Cardiovascular: Negative for chest pain and palpitations.  Gastrointestinal: Negative for nausea, vomiting, abdominal  pain and constipation.  Neurological: Positive for dizziness, syncope, weakness and light-headedness. Negative for headaches.  Psychiatric/Behavioral: Negative for confusion.       Objective:   Physical Exam  Constitutional: He is oriented to person, place, and time. He appears well-developed and well-nourished.  HENT:  Right Ear: External ear normal.  Left Ear: External ear normal.  Mouth/Throat: Oropharynx is clear and moist.  Eyes:       Conjunctiva are pale  Neck: Neck supple.  Cardiovascular: Regular rhythm.        Minimally tachycardic with heart rate around 104  Pulmonary/Chest: Effort normal and breath sounds normal. No respiratory distress. He has no wheezes. He has no rales.  Abdominal: Soft. Bowel sounds are normal. He exhibits distension. He exhibits no mass. There is no tenderness. There is no rebound and no guarding.  Musculoskeletal: He exhibits no edema.  Neurological: He is alert and oriented to person, place, and time. No cranial nerve deficit.          Assessment & Plan:  Patient presents with syncopal episode and one-day history of reported hematochezia. He has orthostatic symptoms and blood pressure drop from 100/60 sitting to 86/50 standing.  ?diverticulosis bleed vs other.  Mild tachycardia. Check hemoglobin. Will likely need admission for further evaluation and stabilization. He was able to stand unassisted in office  Fingerstick hemoglobin 9.1. Given his acute onset of rectal bleeding, anemia, mild tachycardia, orthostasis, and syncope we've recommended hospitalization for further observation and evaluation.

## 2011-10-11 NOTE — Consult Note (Signed)
[facility] Consult: 12:47 PM 10/11/2011   Referring Provider: Lonia Blood, MD  Primary Care Physician:  Kristian Covey, MD Primary Gastroenterologist:  None  Reason for Consultation:  GI bleed  HPI: Carl Lam is a 66 y.o. male.  Diet controlled DM.  Active:  Goes to [facility] at [employer] every 5 AM.  At gym yesterday felt "a thunderstorm" in his belly:  Increased intestinal activity and rolling, no nausea or pain.  Around 6:15, back at home, passed first of several deep red bloody stools which filled commode.  Still no pain or n/v. Several stools throughout the day and into hours of sleep. Noticed tachycardia and positional dizzyness.  Early this AM, walking from 3rd floor to first floor bathroom had brief syncope but awakened and reached commode.  At hospital has had no further passing of blood.  Hgb 9.1, mcv normal.  BUN and Creat normal. He feels and looks well. Ate spinach, eggs, [place] sausage this AM.  Takes a 325 Bayer ASA about 5 of 7 days per week.  No NSAIDs,  No reflux sxs.  Never sees BPR.  No constipation or change in stool appearance until yesterday.   No ETOH for 28 years, was not a heavy drinker before that.  No hx abnormal LFTs    Past Medical History  Diagnosis Date  . 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 HYPERLIPIDEMIA 04/27/2009    Qualifier: Diagnosis of  By: Caryl Never MD, Bruce    . HYPERTENSION 10/27/2008    Qualifier: Diagnosis of  By: Gabriel Rung LPN, Harriett Sine    . DEGENERATIVE JOINT DISEASE 10/27/2008    Qualifier: Diagnosis of  By: Caryl Never MD, Bruce      Past Surgical History  Procedure Date  . Hip surgery 2007    replacement  . Hernia repair     Prior to Admission medications   Medication Sig Start Date End Date Taking? Authorizing Provider  aspirin 325 MG tablet Take 325 mg by mouth daily.   Yes Historical Provider, MD    Scheduled  Meds:    . sodium chloride  3 mL Intravenous Q12H   Infusions:    . sodium chloride 50 mL/hr at 10/11/11 1146   PRN Meds:    Allergies as of 10/11/2011  . (No Known Allergies)    Family History  Problem Relation Age of Onset  . Alcohol abuse Other     grandparent  . Arthritis Other     arthritis  . Diabetes Other     grandparent  No colon cancer, PUD, anemia. Son died at 68 from liver cancer, unclear if this was primary or metastatic dz.  Son was not and alcoholic.  History   Social History  . Marital Status: Married    Spouse Name: N/A    Number of Children: N/A  . Years of Education: N/A   Occupational History  . Retired from [employer]   Social History Main Topics  . Smoking status: Former Smoker -- 0.3 packs/day for 15 years    Types: Cigarettes    Quit date: 10/11/1978  . Smokeless tobacco: Not on file  . Alcohol Use: Not on file  . Drug Use: Not on file  . Sexually Active: Not on file   Other Topics Concern  . Not on file   Social History Narrative  . No narrative on file    REVIEW OF SYSTEMS:  Constitutional:  Weighed 288 # 5 or 6 years ago, steady weight loss since then ENT:  No nose bleeds or seasonal allergies.  Dilated eye exam 2 weeks ago, no problems reported to pt.  Wears reading glasses.  Pulm:  No cough, SOB, asthma CV:  No chest pain. No pedal edema GU:  No hematuria.  No recent prostate exam. GI:  As above.  No dysphagia Heme:  No hx low blood counts .    Transfusions:  None ever Neuro:  No headache.  Just brief syncope as per HPI. No tingling, numbness in feet or  hands Derm:  No rash, sores, itching Endocrine:  Sugars usually belw 130, does not check these often Immunization:  Not querried.  Travel:  none   PHYSICAL EXAM: Vital signs in last 24 hours: Temp:   97.8 F (36.6 C)  Pulse Rate:   122  Resp:  18   BP: (85-109)/(55-69) 85/55 mmHg  SpO2:  [96 %-100 %] 100 %  Weight:  242 lb (109.77 kg)   General: Obese AAM, does  not look ill.  Head:  Normocephallic, atraumatic.   Eyes:  No conj pallor, no icterus.  EOMI Ears:  Not HOH  Nose:  No congestion or discharge. Mouth:  Moist, pink, clear MM.  Upper dental partial.  Teeth missing in lower jaw Neck:  No mass, bruit, JVD Lungs:  Clear.  No resp distress.  Heart: RRR.  S1 and S2 audible.  No MRG Abdomen:  Soft, NT, ND, no mass, no HSM, no bruit.  No hernia.  Active BS.   Rectal: dark stool, scant volume, is FOB positive.    Musc/Skeltl: no joint deformity or swelling.  Extremities:  No pedal edema.  3+ pedal pulses B.  Neurologic:  Oriented x 3.  Excellent historian.  No tremor.  Full strength in all 4 limbs.  Skin:  No rash, sores Tattoos:  none Nodes:  No cervical or inguinal adenopathy   Psych:  Pleasant.  Relaxed, not depressed.   LAB RESULTS:  Basename 10/11/11 1100 10/11/11 0854  WBC 10.5 --  HGB 9.3* 9.1*  HCT 26.7* --  PLT 223 --   BMET Lab Results  Component Value Date   NA 136 10/11/2011   NA 141 07/20/2009   K 4.2 10/11/2011   K 3.9 07/20/2009   CL 104 10/11/2011   CL 105 07/20/2009   CO2 23 10/11/2011   CO2 29 07/20/2009   GLUCOSE 156* 10/11/2011   GLUCOSE 83 07/20/2009   BUN 27* 10/11/2011   BUN 18 07/20/2009   CREATININE 1.36* 10/11/2011   CREATININE 1.3 07/20/2009   CALCIUM 8.6 10/11/2011   CALCIUM 9.1 07/20/2009   LFT No results found for this basename:  PT/INR Lab Results  Component Value Date   INR 1.15 10/11/2011   RADIOLOGY STUDIES: No results found.  ENDOSCOPIC STUDIES:  None ever.    IMPRESSION: *  GI bleed, given normal BUN and no risk factors or sxs for PUD, suspect lower GI bleed, diverticular vs neoplasia.  Ulcer bleed from UGI tract also possible.   *  Syncope due to blood loss.  *  Normocytic anemia. Have no prior CBC for comparision, but strongly suspect this is due to Acute blood loss.  *  Type 2 DM, off oral agents and insulin for last three years. *  Obesity.  Has managed to steadily lose weight over several  years.  PLAN: *  Add once daily po Protonix in case this  is an ulcer.  *  CBC this evening and in AM.  *  Increase IVF to 150 ml per hour as BP still soft.  *  Prep tonight and colon/egd tomorrow. D/w pt who understands procedure and agrees to proceed.   LOS: 0 days   [name]  10/11/2011, 12:47 PM Pager: [phone] I have reviewed the above note, examined the patient and agree with plan of treatment .Rapid painless LGIB likely diverticular, first episode, he has stabilized in past 12 hours since  The last BM at home. BUN slightly elevated at 27 may reflect hemoconcentration rather than an indication for an UGI source. He is very cooperative and willing to prep tonight for colonoscopy in am. He is likely to pass lot of old blood as he drinks the Movi prep , also his H/H is likely to equilibrate with hydration  Which would not be an indicator of re- bleeding episode. But If he becomes diaphoretic and tachycardic, then Tagged RBC  Pool bleeding scan in Nuclear Medicine will be appropriate. He has been set up for colonoscopy for tomorrow.

## 2011-10-12 ENCOUNTER — Encounter (HOSPITAL_COMMUNITY)
Admission: AD | Disposition: A | Discharge status: Home / Self Care | Payer: Self-pay | Source: Ambulatory Visit | Attending: Internal Medicine

## 2011-10-12 ENCOUNTER — Inpatient Hospital Stay (HOSPITAL_COMMUNITY): Payer: Medicare Other

## 2011-10-12 ENCOUNTER — Encounter (HOSPITAL_COMMUNITY): Payer: Self-pay | Admitting: Internal Medicine

## 2011-10-12 DIAGNOSIS — D62 Acute posthemorrhagic anemia: Secondary | ICD-10-CM

## 2011-10-12 DIAGNOSIS — K922 Gastrointestinal hemorrhage, unspecified: Secondary | ICD-10-CM | POA: Diagnosis not present

## 2011-10-12 DIAGNOSIS — K5731 Diverticulosis of large intestine without perforation or abscess with bleeding: Secondary | ICD-10-CM

## 2011-10-12 HISTORY — PX: ESOPHAGOGASTRODUODENOSCOPY: SHX5428

## 2011-10-12 HISTORY — PX: COLONOSCOPY: SHX5424

## 2011-10-12 LAB — CBC
HCT: 22.7 % — ABNORMAL LOW (ref 39.0–52.0)
Hemoglobin: 8.1 g/dL — ABNORMAL LOW (ref 13.0–17.0)
Hemoglobin: 7.6 g/dL — ABNORMAL LOW (ref 13.0–17.0)
Hemoglobin: 7.4 g/dL — ABNORMAL LOW (ref 13.0–17.0)
Hemoglobin: 7.9 g/dL — ABNORMAL LOW (ref 13.0–17.0)
MCH: 30.6 pg (ref 26.0–34.0)
MCHC: 35.7 g/dL (ref 30.0–36.0)
MCHC: 35 g/dL (ref 30.0–36.0)
MCHC: 34.9 g/dL (ref 30.0–36.0)
MCV: 86.3 fL (ref 78.0–100.0)
MCV: 87.5 fL (ref 78.0–100.0)
RBC: 2.48 MIL/uL — ABNORMAL LOW (ref 4.22–5.81)
RBC: 2.6 MIL/uL — ABNORMAL LOW (ref 4.22–5.81)
RDW: 14.5 % (ref 11.5–15.5)
WBC: 7.4 10*3/uL (ref 4.0–10.5)

## 2011-10-12 LAB — PREPARE RBC (CROSSMATCH)

## 2011-10-12 LAB — BASIC METABOLIC PANEL
BUN: 14 mg/dL (ref 6–23)
Chloride: 107 mEq/L (ref 96–112)
Creatinine, Ser: 1.12 mg/dL (ref 0.50–1.35)
GFR calc non Af Amer: 67 mL/min — ABNORMAL LOW (ref >90)
Glucose, Bld: 126 mg/dL — ABNORMAL HIGH (ref 70–99)
Potassium: 3.9 mEq/L (ref 3.5–5.1)

## 2011-10-12 SURGERY — EGD (ESOPHAGOGASTRODUODENOSCOPY)
Anesthesia: Moderate Sedation

## 2011-10-12 MED ORDER — DIPHENHYDRAMINE HCL 50 MG/ML IJ SOLN
INTRAMUSCULAR | Status: AC
  Filled 2011-10-12: qty 1

## 2011-10-12 MED ORDER — SODIUM CHLORIDE 0.9 % IV SOLN: INTRAVENOUS | Status: DC

## 2011-10-12 MED ORDER — BUTAMBEN-TETRACAINE-BENZOCAINE 2-2-14 % EX AERO
INHALATION_SPRAY | CUTANEOUS | Status: DC | PRN
  Administered 2011-10-12: 2 via TOPICAL

## 2011-10-12 MED ORDER — FENTANYL CITRATE 0.05 MG/ML IJ SOLN
INTRAMUSCULAR | Status: AC
Start: 2011-10-12 — End: 2011-10-12
  Filled 2011-10-12: qty 4

## 2011-10-12 MED ORDER — MIDAZOLAM HCL 10 MG/2ML IJ SOLN
INTRAMUSCULAR | Status: DC | PRN
  Administered 2011-10-12 (×5): 2 mg via INTRAVENOUS

## 2011-10-12 MED ORDER — TECHNETIUM TC 99M-LABELED RED BLOOD CELLS IV KIT
25.0000 | PACK | Freq: Once | INTRAVENOUS | Status: AC | PRN
  Administered 2011-10-12: 25 via INTRAVENOUS

## 2011-10-12 MED ORDER — FENTANYL CITRATE 0.05 MG/ML IJ SOLN
INTRAMUSCULAR | Status: DC | PRN
  Administered 2011-10-12 (×5): 25 ug via INTRAVENOUS

## 2011-10-12 MED ORDER — MIDAZOLAM HCL 10 MG/2ML IJ SOLN
INTRAMUSCULAR | Status: AC
  Filled 2011-10-12: qty 4

## 2011-10-12 NOTE — Progress Notes (Signed)
Patient prepping for colonoscopy overnight.  Hemoccult was ordered but missed by RN.  Communicated to oncoming shift for collection if possible.

## 2011-10-12 NOTE — Interval H&P Note (Signed)
History and Physical Interval Note:  10/12/2011 12:14 PM  Carl Lam  has presented today for surgery, with the diagnosis of gi bleed  The various methods of treatment have been discussed with the patient and family. After consideration of risks, benefits and other options for treatment, the patient has consented to  Procedure(s) (LRB): ESOPHAGOGASTRODUODENOSCOPY (EGD) (N/A) COLONOSCOPY (N/A) as a surgical intervention .  The patient's history has been reviewed, patient examined, no change in status, stable for surgery.  I have reviewed the patients' chart and labs.  Questions were answered to the patient's satisfaction.     Lina Sar

## 2011-10-12 NOTE — Progress Notes (Signed)
     [facility] Daily Rounding Note 10/12/2011, 8:24 AM  SUBJECTIVE:       Continues to pass hematochezia, no belly pain.  No recurrent syncope, dizzyness.  Nearly completed Moviprep.  Got one unit of RBC overnight.   OBJECTIVE:        General: Looks well, NAD.    Vital signs in last 24 hours:    Temp:  [97.8 F (36.6 C)-98.9 F (37.2 C)] 98.4 F (36.9 C) (06/28 0500) Pulse Rate:  [71-122] 76  (06/28 0500) Resp:  [15-22] 20  (06/28 0500) BP: (85-132)/(52-78) 117/69 mmHg (06/28 0500) SpO2:  [96 %-100 %] 97 % (06/28 0500) Weight:  [240 lb 1.6 oz (108.909 kg)-251 lb 12.3 oz (114.2 kg)] 251 lb 12.3 oz (114.2 kg) (06/28 0500) Last BM Date: 10/11/11 Heart: RRR.  No MRG Chest: clear B.  No resp distress. Abdomen: soft, NT, active BS  Extremities: no pedal edema Neuro/Psych:  Pleasant, alert, no confusion.  Relaxed  Intake/Output from previous day: 06/27 0701 - 06/28 0700 In: 250 [I.V.:250] Out: -   Intake/Output this shift:    Lab Results:  Basename 10/12/11 0329 10/11/11 1708 10/11/11 1100  WBC 7.3 9.1 10.5  HGB 8.1* 7.7* 9.3*  HCT 22.7* 22.3* 26.7*  PLT 154 180 223   BMET  Basename 10/12/11 0329 10/11/11 1100  NA 141 136  K 3.9 4.2  CL 107 104  CO2 25 23  GLUCOSE 126* 156*  BUN 14 27*  CREATININE 1.12 1.36*  CALCIUM 7.9* 8.6   ASSESMENT: * GI bleed, given normal BUN and no risk factors or sxs for PUD, suspect lower GI bleed, diverticular vs neoplasia. Ulcer bleed from UGI tract also possible.  * Syncope due to blood loss.  * ABL anemia.  S/P one unit PRBC.  Have no prior CBC for comparision, * Type 2 DM, off oral agents and insulin for last three years.  *  Renal insufficiency, azotemia, resolved with IVF.  PLAN: *  Colonoscopy/egd set for noon today.  Give tap water enema later this AM if stools continue bloody.  *  IVF to 125 cc/hour. Currently at 50 and he will be NPO for several more hours.  *  Follow H & H, transfuse again as needed.    LOS: 1 day    [name]  10/12/2011, 8:24 AM Pager: [phone]  Patient seen and I agree with the above documentation including the assessment and plan

## 2011-10-12 NOTE — Op Note (Addendum)
[facility] [address] [place], [place]  [address]  COLONOSCOPY PROCEDURE REPORT  PATIENT:  Carl Lam, Carl Lam  MR#:  [id number] BIRTHDATE:  15-Apr-1946, 66 yrs. old  GENDER:  male ENDOSCOPIST:  Hedwig Morton. Juanda Chance, MD REF. BY:  Dr Lonia Blood PROCEDURE DATE:  10/12/2011 PROCEDURE:  Colonoscopy 21308 ASA CLASS:  Class II INDICATIONS:  Gastrointestinal hemorrhage MEDICATIONS:   These medications were titrated to patient response per physician's verbal order, Versed 5 mg, Versed 4 mg, Fentanyl 75 mcg  DESCRIPTION OF PROCEDURE:   After the risks and benefits and of the procedure were explained, informed consent was obtained. Digital rectal exam was performed and revealed no rectal masses. The Pentax Ped Colon [id number] endoscope was introduced through the anus and advanced to the cecum, which was identified by both the appendix and ileocecal valve.  The quality of the prep was poor, using MoviPrep.  The instrument was then slowly withdrawn as the colon was fully examined. <<PROCEDUREIMAGES>>  FINDINGS:  A bleeding site was found throughout the colon (see image002).  Moderate diverticulosis was found throughout the colon (see image002, image003, image004, image008, image009, image010, and image011).  normal cecum (see image007 and image006). Retroflexed views in the rectum revealed no abnormalities.    The scope was then withdrawn from the patient and the procedure completed.  COMPLICATIONS:  None ENDOSCOPIC IMPRESSION: 1) Bleeding throughout the colon, the exact site not determined but likely right colon 2) Moderate diverticulosis throughout the colon 3) Normal cecum moderate amount of fresh blood throughout the colon, suspect right sided diverticular bleed, limited visualization due to blood  RECOMMENDATIONS: tagged RBC pool scan, if positive then mesenteric arteriogram transfuse to keep Hgb >8.5 bowl rest  REPEAT EXAM:  In 10 year(s)  for.  ______________________________ Hedwig Morton. Juanda Chance, MD  CC:  n. eSIGNED:   Hedwig Morton. Ivonna Kinnick at 10/12/2011 01:14 PM  Jamesetta Orleans, [phone]

## 2011-10-12 NOTE — Progress Notes (Signed)
[facility] PROGRESS NOTE  Carl Lam OZH:[id number] DOB: June 22, 1945 DOA: 10/11/2011 PCP: Kristian Covey, MD  Assessment/Plan: 1. GI bleed - probable diverticular bleed. Admitted on 6/28 bleeding continued overnight. EGd negative for bleed, colonoscopy without mass but with active bleeding - probable diverticular. Patient received 2 units of prbcs (one unit 6/27 the second unit 6/28). NM tagged RBC scan negative for bleed. 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 UNCNTRL  HYPERLIPIDEMIA  HYPERTENSION  DEGENERATIVE JOINT DISEASE  Anemia due to blood loss, acute  Code Status: full Sigmond Patalano, MD  [facility] Pager [phone]  If 7PM-7AM, please contact night-coverage [email or web address] Password [facility] 10/12/2011, 6:22 PM   LOS: 1 day   [facility]  HPI/Subjective: No pain, feels great  Objective: Filed Vitals:   10/12/11 1357 10/12/11 1400 10/12/11 1412 10/12/11 1748  BP: 101/52 98/48 123/76 118/72  Pulse:    72  Temp:    97.7 F (36.5 C)  TempSrc:    Oral  Resp: 24 15 15 18   Height:      Weight:      SpO2: 99% 98% 98%     Intake/Output Summary (Last 24 hours) at 10/12/11 1822 Last data filed at 10/12/11 1810  Gross per 24 hour  Intake  762.5 ml  Output      0 ml  Net  762.5 ml    Exam:   General:  axox3  Cardiovascular: RRR  Respiratory: CTAB  Abdomen: soft, NT   Data Reviewed: Basic Metabolic Panel:  Lab 10/12/11 1324 10/11/11 1100  NA 141 136  K 3.9 4.2  CL 107 104  CO2 25 23  GLUCOSE 126* 156*  BUN 14 27*  CREATININE 1.12 1.36*  CALCIUM 7.9* 8.6  MG -- --  PHOS -- --   Liver Function Tests:  Lab 10/11/11 1100  AST 13  ALT 12  ALKPHOS 45  BILITOT 0.3  PROT 6.4  ALBUMIN 3.7   No results found for this basename: LIPASE:5,AMYLASE:5 in the last 168 hours No results found for this basename: AMMONIA:5 in the last 168  hours CBC:  Lab 10/12/11 1055 10/12/11 0329 10/11/11 1708 10/11/11 1100 10/11/11 0854  WBC 7.8 7.3 9.1 10.5 --  NEUTROABS -- -- -- 7.9* --  HGB 7.6* 8.1* 7.7* 9.3* 9.1*  HCT 21.7* 22.7* 22.3* 26.7* --  MCV 87.5 86.3 87.1 85.9 --  PLT 166 154 180 223 --   Cardiac Enzymes: No results found for this basename: CKTOTAL:5,CKMB:5,CKMBINDEX:5,TROPONINI:5 in the last 168 hours BNP (last 3 results) No results found for this basename: PROBNP:3 in the last 8760 hours CBG: No results found for this basename: GLUCAP:5 in the last 168 hours  No results found for this or any previous visit (from the past 240 hour(s)).   Studies: Nm Gi Blood Loss  10/12/2011  *RADIOLOGY REPORT*  Clinical Data: Lower GI bleed  NUCLEAR MEDICINE GASTROINTESTINAL BLEEDING STUDY  Technique:  Sequential abdominal images were obtained following intravenous administration of Tc-73m labeled red blood cells.  Radiopharmaceutical: CURIE ULTRATAG TECHNETIUM TC 21M- LABELED RED BLOOD CELLS IV KIT  Comparison: None  Findings: Sequential images were obtained for 2 hours. Normal blood pool distribution of tracer is identified. Small amounts of excreted tracer seen within the kidneys and bladder. A faint area of tracer accumulation is seen at the right flank and during the first hour but becomes less evident during the second  hour. This does not demonstrate normal peristalsis through bowel. This could represent a site of abnormal blood pool distribution though a subtle focus of GI bleeding is not definitely excluded. No other sites of abnormal tracer accumulation are identified.  IMPRESSION: Negative GI bleeding study.  Original Report Authenticated By: Lollie Marrow, M.D.    Scheduled Meds:    . pantoprazole  40 mg Oral Q0600  . peg 3350 powder  0.5 kit Oral Custom  . sodium chloride  3 mL Intravenous Q12H   Continuous Infusions:    . sodium chloride 125 mL/hr at 10/12/11 0917  . DISCONTD: sodium chloride

## 2011-10-12 NOTE — Op Note (Signed)
[facility] [address] [place], [place]  [address]  ENDOSCOPY PROCEDURE REPORT  PATIENT:  Carl Lam, Carl Lam  MR#:  [id number] BIRTHDATE:  [birth year], 66 yrs. old  GENDER:  male  ENDOSCOPIST:  Hedwig Morton. Juanda Chance, MD Referred by:  Dr Lavera Guise  PROCEDURE DATE:  10/12/2011 PROCEDURE:  EGD, diagnostic 43235 ASA CLASS:  Class II INDICATIONS:  hematochezia, anemia large volume painless hematochezia  MEDICATIONS:   These medications were titrated to patient response per physician's verbal order, Versed 6 mg, Fentanyl 50 mcg TOPICAL ANESTHETIC:  Cetacaine Spray  DESCRIPTION OF PROCEDURE:   After the risks benefits and alternatives of the procedure were thoroughly explained, informed consent was obtained.  The Pentax Gastroscope [id number] endoscope was introduced through the mouth and advanced to the second portion of the duodenum, without limitations.  The instrument was slowly withdrawn as the mucosa was fully examined. <<PROCEDUREIMAGES>>  A hiatal hernia was found (see image001, image002, image003, image004, image005, and image006). 1-2 cm hiatal hermia eccentric pylorus  Otherwise the examination was normal. Retroflexed views revealed no abnormalities.    The scope was then withdrawn from the patient and the procedure completed.  COMPLICATIONS:  None  ENDOSCOPIC IMPRESSION: 1) Hiatal hernia 2) Otherwise normal examination nothing to account for the large volume hematochezia RECOMMENDATIONS: colonoscopy  REPEAT EXAM:  In 0 year(s) for.  ______________________________ Hedwig Morton. Juanda Chance, MD  CC:  n. eSIGNED:   Hedwig Morton. Lenae Wherley at 10/12/2011 12:42 PM  Jamesetta Orleans, [id number]

## 2011-10-12 NOTE — Progress Notes (Signed)
Pt still having dark/red watery stool; tap water enema given; pt tolerated well; will continue to monitor

## 2011-10-12 NOTE — Progress Notes (Signed)
Colonoscopy completed, there is a moderate amount of fresh blood in the colon , including cecum, extensive diverticulosis all the way to the cecal pouch, exact site of the bleeding could not be determined but suspect right colon bleed. He dropped his blood pressure once during the exam. Plan: I have talked to Nuclear Medicine- will proceed with RBC pool scan now, if positive , consider mesenteric arteriogram Will transfuse 2 more units of pRBC's

## 2011-10-12 NOTE — Progress Notes (Signed)
1400 transferred patient to Nuclear med for mesenteric arteriogram via stretcher

## 2011-10-13 DIAGNOSIS — D62 Acute posthemorrhagic anemia: Secondary | ICD-10-CM

## 2011-10-13 DIAGNOSIS — K5731 Diverticulosis of large intestine without perforation or abscess with bleeding: Secondary | ICD-10-CM

## 2011-10-13 LAB — CBC
HCT: 22.5 % — ABNORMAL LOW (ref 39.0–52.0)
Hemoglobin: 7.4 g/dL — ABNORMAL LOW (ref 13.0–17.0)
MCHC: 33.8 g/dL (ref 30.0–36.0)
MCV: 88.6 fL (ref 78.0–100.0)
Platelets: 149 10*3/uL — ABNORMAL LOW (ref 150–400)
RBC: 2.54 MIL/uL — ABNORMAL LOW (ref 4.22–5.81)
RDW: 14.7 % (ref 11.5–15.5)
WBC: 6.2 10*3/uL (ref 4.0–10.5)

## 2011-10-13 LAB — BASIC METABOLIC PANEL
GFR calc Af Amer: 79 mL/min — ABNORMAL LOW (ref >90)
GFR calc non Af Amer: 68 mL/min — ABNORMAL LOW (ref >90)
Potassium: 3.8 mEq/L (ref 3.5–5.1)
Sodium: 143 mEq/L (ref 135–145)

## 2011-10-13 LAB — OCCULT BLOOD X 1 CARD TO LAB, STOOL: Fecal Occult Bld: NEGATIVE

## 2011-10-13 LAB — HEMOGLOBIN AND HEMATOCRIT, BLOOD: Hemoglobin: 9.7 g/dL — ABNORMAL LOW (ref 13.0–17.0)

## 2011-10-13 MED ORDER — FUROSEMIDE 10 MG/ML IJ SOLN
20.0000 mg | Freq: Once | INTRAMUSCULAR | Status: AC
  Administered 2011-10-13: 20 mg via INTRAVENOUS
  Filled 2011-10-13: qty 2

## 2011-10-13 NOTE — Progress Notes (Signed)
Progress Note for [facility].  Subjective: No further hematochezia.  Objective: Vital signs in last 24 hours: Temp:  [97.7 F (36.5 C)-99 F (37.2 C)] 98.7 F (37.1 C) (06/28 2105) Pulse Rate:  [71-85] 72  (06/29 0345) Resp:  [12-24] 20  (06/29 0345) BP: (90-134)/(45-80) 112/64 mmHg (06/29 0345) SpO2:  [96 %-100 %] 97 % (06/29 0345) Last BM Date: 10/12/11  Intake/Output from previous day: 06/28 0701 - 06/29 0700 In: 1387.5 [I.V.:1000; Blood:387.5] Out: -  Intake/Output this shift: Total I/O In: 360 [P.O.:360] Out: -   General appearance: alert and no distress GI: soft, non-tender; bowel sounds normal; no masses,  no organomegaly  Lab Results:  Basename 10/13/11 0950 10/13/11 0130 10/12/11 2237  WBC 6.2 6.2 7.4  HGB 7.8* 7.4* 7.9*  HCT 22.5* 21.9* 22.7*  PLT 160 149* 157   BMET  Basename 10/13/11 0130 10/12/11 0329 10/11/11 1100  NA 143 141 136  K 3.8 3.9 4.2  CL 109 107 104  CO2 26 25 23   GLUCOSE 94 126* 156*  BUN 8 14 27*  CREATININE 1.11 1.12 1.36*  CALCIUM 8.0* 7.9* 8.6   LFT  Basename 10/11/11 1100  PROT 6.4  ALBUMIN 3.7  AST 13  ALT 12  ALKPHOS 45  BILITOT 0.3  BILIDIR --  IBILI --   PT/INR  Basename 10/11/11 1100  LABPROT 14.9  INR 1.15   Hepatitis Panel No results found for this basename: HEPBSAG,HCVAB,HEPAIGM,HEPBIGM in the last 72 hours C-Diff No results found for this basename: CDIFFTOX:3 in the last 72 hours Fecal Lactopherrin No results found for this basename: FECLLACTOFRN in the last 72 hours  Studies/Results: Nm Gi Blood Loss  10/12/2011  *RADIOLOGY REPORT*  Clinical Data: Lower GI bleed  NUCLEAR MEDICINE GASTROINTESTINAL BLEEDING STUDY  Technique:  Sequential abdominal images were obtained following intravenous administration of Tc-55m labeled red blood cells.  Radiopharmaceutical: CURIE ULTRATAG TECHNETIUM TC 39M- LABELED RED BLOOD CELLS IV KIT  Comparison: None  Findings: Sequential images were obtained for 2 hours.  Normal blood pool distribution of tracer is identified. Small amounts of excreted tracer seen within the kidneys and bladder. A faint area of tracer accumulation is seen at the right flank and during the first hour but becomes less evident during the second hour. This does not demonstrate normal peristalsis through bowel. This could represent a site of abnormal blood pool distribution though a subtle focus of GI bleeding is not definitely excluded. No other sites of abnormal tracer accumulation are identified.  IMPRESSION: Negative GI bleeding study.  Original Report Authenticated By: Lollie Marrow, M.D.    Medications:  Scheduled:   . furosemide  20 mg Intravenous Once  . pantoprazole  40 mg Oral Q0600  . sodium chloride  3 mL Intravenous Q12H   Continuous:   . sodium chloride 125 mL/hr at 10/12/11 0917  . DISCONTD: sodium chloride      Assessment/Plan: 1) Diverticular bleed.   The bleeding scan was negative for any overt bleeding.  No overt bleeding at this time.  Plan: 1) Follow HGB and transfuse as necessary.  LOS: 2 days   Murrell Dome D 10/13/2011, 11:58 AM

## 2011-10-13 NOTE — Progress Notes (Signed)
10/13/11 1556 Patient transferred from 3713. Skin intact. A & O x 4. From home with wife. Ambulatory.

## 2011-10-13 NOTE — Progress Notes (Signed)
Patient blood transfusion completed at 1950, no reaction, IV site wnl, will continue to monitor.  Macarthur Critchley, RN

## 2011-10-13 NOTE — Progress Notes (Signed)
[facility] PROGRESS NOTE  Carl Lam ZOX:[id number] DOB: 21-Jun-1945 DOA: 10/11/2011 PCP: Kristian Covey, MD  Assessment/Plan: 1. GI bleed - probable diverticular bleed. Admitted on 6/28 bleeding continued overnight. EGd negative for bleed, colonoscopy without mass but with active bleeding - probable diverticular. Patient received 2 units of prbcs (one unit 6/27 the second unit 6/28). NM tagged RBC scan negative for bleed. Continue to monitor closely.Advance diet .  2 more units being transfused today. Transfuse for hemoglobin less than 8.  Principal Problem:  *GI bleed Active Problems:  DIAB W/O COMP TYPE II/UNS NOT STATED UNCNTRL  HYPERLIPIDEMIA  HYPERTENSION  DEGENERATIVE JOINT DISEASE  Anemia due to blood loss, acute  Code Status: full Jameel Quant, Ladell Pier, MD  [facility] Pager [phone]  If 7PM-7AM, please contact night-coverage [email or web address] Password [email or web address] 10/13/2011, 7:10 PM   LOS: 2 days   [facility]  HPI/Subjective: No pain, feels great.  No further bleeding noted.  Objective: Filed Vitals:   10/13/11 1605 10/13/11 1650 10/13/11 1750 10/13/11 1850  BP: 139/77 122/80 153/88 126/76  Pulse: 73 66 78 81  Temp:  97.9 F (36.6 C) 98.7 F (37.1 C) 97.6 F (36.4 C)  TempSrc: Oral Oral Oral Oral  Resp: 20 16 20 18   Height: 6\' 2"  (1.88 m)     Weight: 108.9 kg (240 lb 1.3 oz)     SpO2: 96% 98% 100% 97%    Intake/Output Summary (Last 24 hours) at 10/13/11 1910 Last data filed at 10/13/11 1650  Gross per 24 hour  Intake   1460 ml  Output      0 ml  Net   1460 ml    Exam:   General:  axox3  Cardiovascular: RRR  Respiratory: CTAB  Abdomen: soft, NT   Data Reviewed: Basic Metabolic Panel:  Lab 10/13/11 8295 10/12/11 0329 10/11/11 1100  NA 143 141 136  K 3.8 3.9 4.2  CL 109 107 104  CO2 26 25 23   GLUCOSE 94 126* 156*  BUN 8 14 27*  CREATININE 1.11 1.12 1.36*  CALCIUM 8.0* 7.9* 8.6  MG -- -- --  PHOS -- -- --    Liver Function Tests:  Lab 10/11/11 1100  AST 13  ALT 12  ALKPHOS 45  BILITOT 0.3  PROT 6.4  ALBUMIN 3.7  CBC:  Lab 10/13/11 0950 10/13/11 0130 10/12/11 2237 10/12/11 1800 10/12/11 1055 10/11/11 1100  WBC 6.2 6.2 7.4 7.2 7.8 --  NEUTROABS -- -- -- -- -- 7.9*  HGB 7.8* 7.4* 7.9* 7.4* 7.6* --  HCT 22.5* 21.9* 22.7* 21.2* 21.7* --  MCV 88.6 88.0 87.3 87.2 87.5 --  PLT 160 149* 157 150 166 --   Studies: Nm Gi Blood Loss  10/12/2011  *RADIOLOGY REPORT*  Clinical Data: Lower GI bleed  NUCLEAR MEDICINE GASTROINTESTINAL BLEEDING STUDY  Technique:  Sequential abdominal images were obtained following intravenous administration of Tc-40m labeled red blood cells.  Radiopharmaceutical: CURIE ULTRATAG TECHNETIUM TC 72M- LABELED RED BLOOD CELLS IV KIT  Comparison: None  Findings: Sequential images were obtained for 2 hours. Normal blood pool distribution of tracer is identified. Small amounts of excreted tracer seen within the kidneys and bladder. A faint area of tracer accumulation is seen at the right flank and during the first hour but becomes less evident during the second hour. This does not demonstrate normal peristalsis through bowel. This could represent a site of abnormal blood pool distribution though a subtle focus of GI bleeding is not definitely  excluded. No other sites of abnormal tracer accumulation are identified.  IMPRESSION: Negative GI bleeding study.  Original Report Authenticated By: Lollie Marrow, M.D.    Scheduled Meds:    . furosemide  20 mg Intravenous Once  . pantoprazole  40 mg Oral Q0600  . sodium chloride  3 mL Intravenous Q12H   Continuous Infusions:    . sodium chloride 125 mL/hr at 10/12/11 [phone]

## 2011-10-14 DIAGNOSIS — K5731 Diverticulosis of large intestine without perforation or abscess with bleeding: Secondary | ICD-10-CM

## 2011-10-14 DIAGNOSIS — D62 Acute posthemorrhagic anemia: Secondary | ICD-10-CM

## 2011-10-14 LAB — TYPE AND SCREEN
ABO/RH(D): O POS
Unit division: 0
Unit division: 0

## 2011-10-14 LAB — CBC
Hemoglobin: 9.2 g/dL — ABNORMAL LOW (ref 13.0–17.0)
MCH: 30.4 pg (ref 26.0–34.0)
RBC: 3.03 MIL/uL — ABNORMAL LOW (ref 4.22–5.81)
WBC: 5.9 10*3/uL (ref 4.0–10.5)

## 2011-10-14 LAB — BASIC METABOLIC PANEL
CO2: 28 mEq/L (ref 19–32)
GFR calc non Af Amer: 59 mL/min — ABNORMAL LOW (ref >90)
Glucose, Bld: 123 mg/dL — ABNORMAL HIGH (ref 70–99)
Potassium: 3.8 mEq/L (ref 3.5–5.1)
Sodium: 142 mEq/L (ref 135–145)

## 2011-10-14 NOTE — Progress Notes (Signed)
Nsg Discharge Note  Admit Date:  10/11/2011 Discharge date: 10/14/2011   Carl Lam to be D/C'd Home per MD order.  AVS completed.  Copy for chart, and copy for patient signed, and dated. Patient/caregiver able to verbalize understanding.  Discharge Medication: There are no discharge medications for this patient.    Discharge Assessment: Filed Vitals:   10/14/11 0533  BP: 113/71  Pulse: 62  Temp: 98.3 F (36.8 C)  Resp: 18   Skin clean, dry and intact without evidence of skin break down, no evidence of skin tears noted. IV catheter discontinued intact. Site without signs and symptoms of complications - no redness or edema noted at insertion site, patient denies c/o pain - only slight tenderness at site.  Dressing with slight pressure applied.  D/c Instructions-Education: Discharge instructions given to patient/family with verbalized understanding. D/c education completed with patient/family including follow up instructions, medication list, d/c activities limitations if indicated, with other d/c instructions as indicated by MD - patient able to verbalize understanding, all questions fully answered. Patient instructed to return to ED, call 911, or call MD for any changes in condition.  Patient escorted via WC, and D/C home via private auto.  Miriam Liles Consuella Lose, RN 10/14/2011 1:24 PM

## 2011-10-14 NOTE — Progress Notes (Signed)
Progress Note for [facility]  Subjective: No further hematochezia.  Objective: Vital signs in last 24 hours: Temp:  [97.6 F (36.4 C)-98.7 F (37.1 C)] 98.3 F (36.8 C) (06/30 0533) Pulse Rate:  [62-87] 62  (06/30 0533) Resp:  [16-20] 18  (06/30 0533) BP: (113-153)/(70-88) 113/71 mmHg (06/30 0533) SpO2:  [96 %-100 %] 98 % (06/30 0533) Weight:  [108.9 kg (240 lb 1.3 oz)] 108.9 kg (240 lb 1.3 oz) (06/29 1605) Last BM Date: 10/13/11  Intake/Output from previous day: 06/29 0701 - 06/30 0700 In: 1210 [P.O.:360; I.V.:500; Blood:350] Out: -  Intake/Output this shift:    General appearance: alert and no distress GI: soft, non-tender; bowel sounds normal; no masses,  no organomegaly  Lab Results:  Basename 10/14/11 0530 10/13/11 2100 10/13/11 0950 10/13/11 0130  WBC 5.9 -- 6.2 6.2  HGB 9.2* 9.7* 7.8* --  HCT 26.9* 27.6* 22.5* --  PLT 181 -- 160 149*   BMET  Basename 10/14/11 0530 10/13/11 0130 10/12/11 0329  NA 142 143 141  K 3.8 3.8 3.9  CL 107 109 107  CO2 28 26 25   GLUCOSE 123* 94 126*  BUN 8 8 14   CREATININE 1.25 1.11 1.12  CALCIUM 8.7 8.0* 7.9*   LFT  Basename 10/11/11 1100  PROT 6.4  ALBUMIN 3.7  AST 13  ALT 12  ALKPHOS 45  BILITOT 0.3  BILIDIR --  IBILI --   PT/INR  Basename 10/11/11 1100  LABPROT 14.9  INR 1.15   Hepatitis Panel No results found for this basename: HEPBSAG,HCVAB,HEPAIGM,HEPBIGM in the last 72 hours C-Diff No results found for this basename: CDIFFTOX:3 in the last 72 hours Fecal Lactopherrin No results found for this basename: FECLLACTOFRN in the last 72 hours  Studies/Results: Nm Gi Blood Loss  10/12/2011  *RADIOLOGY REPORT*  Clinical Data: Lower GI bleed  NUCLEAR MEDICINE GASTROINTESTINAL BLEEDING STUDY  Technique:  Sequential abdominal images were obtained following intravenous administration of Tc-88m labeled red blood cells.  Radiopharmaceutical: CURIE ULTRATAG TECHNETIUM TC 68M- LABELED RED BLOOD CELLS IV KIT   Comparison: None  Findings: Sequential images were obtained for 2 hours. Normal blood pool distribution of tracer is identified. Small amounts of excreted tracer seen within the kidneys and bladder. A faint area of tracer accumulation is seen at the right flank and during the first hour but becomes less evident during the second hour. This does not demonstrate normal peristalsis through bowel. This could represent a site of abnormal blood pool distribution though a subtle focus of GI bleeding is not definitely excluded. No other sites of abnormal tracer accumulation are identified.  IMPRESSION: Negative GI bleeding study.  Original Report Authenticated By: Lollie Marrow, M.D.    Medications:  Scheduled:   . furosemide  20 mg Intravenous Once  . pantoprazole  40 mg Oral Q0600  . sodium chloride  3 mL Intravenous Q12H   Continuous:   . sodium chloride 125 mL/hr at 10/12/11 1610    Assessment/Plan: 1) Diverticular bleed.   He is clinically stable.  HGB increased appropriately with transfusions.  Plan: 1) Okay to D/C Home.   2) Signing off.  LOS: 3 days   Chereese Cilento D 10/14/2011, 8:54 AM

## 2011-10-14 NOTE — Discharge Summary (Signed)
Physician Discharge Summary  Carl Lam AVW:[id number] DOB: 06/26/1945 DOA: 10/11/2011  PCP: Kristian Covey, MD  Admit date: 10/11/2011 Discharge date: 10/14/2011  Recommendations for Outpatient Follow-up:   Follow-up Information    Follow up with Kristian Covey, MD in 1 week.   Contact information:   [address] [facility] [place] [address] [phone]          Discharge Diagnoses:  1. GI bleed 2. Acute blood loss anemia 3. Diet-controlled hypertension 4. Diet-controlled hyperlipidemia 5. Diet-controlled diabetes  Discharge Condition: Stable Disposition: Home  Diet recommendation: Diabetic/heart healthy diet  History of present illness:  66 year old man without any significant past medical history presented to his primary care physician complaining of having numerous bloody bowel movements. He was found to be tachycardic and hypotensive and was referred for admission. Patient denies any nausea vomiting or hematemesis. The daughter describes the patient's bleeding as-looking white male menstruation. Patient's last bowel movement was at 5 AM.   Hospital Course:  Principal Problem:  *GI bleed GI bleed - Probably diverticular bleed.  Patient was dmitted on 6/28 with GI bleed . Bleeding continued overnight the first night of admission. EGd next day was negative for bleed, colonoscopy without mass but with active bleeding .  NM tagged RBC scan negative for bleed. Patient received 2 units of prbcs (one unit 6/27 the second unit 6/28). 2 more units being transfused on 7/29.  Hemoglobin remained stable no further bleeding noted.  The patient most likely had a diverticular bleed.  Patient was told to follow up with his primary care physician as regards to his aspirin and for followup CBC.  Patient was stable at the time of discharge  Active Problems:  DIAB W/O COMP TYPE II/UNS NOT STATED UNCNTRL Stable during this admission.  Patient is diet  controlled.   HYPERLIPIDEMIA Patient to follow up patient with primary doctor.  He is on no medications for this is diet controlled.   HYPERTENSION Patient's blood pressure is also diet-controlled.  Patient's blood pressure was stable during this admission.   DEGENERATIVE JOINT DISEASE No active pain while inpatient.   Anemia due to blood loss, acute Patient was transfused 4 units of blood.  Hemoglobin was stable at the time of discharge.   Discharge Instructions   Medication List  As of 10/14/2011  9:54 AM   STOP taking these medications         aspirin 325 MG tablet           Follow-up Information    Follow up with Kristian Covey, MD in 1 week.   Contact information:   [address] [place] [address] [phone]           The results of significant diagnostics from this hospitalization (including imaging, microbiology, ancillary and laboratory) are listed below for reference.    Significant Diagnostic Studies: Nm Gi Blood Loss  10/12/2011  *RADIOLOGY REPORT*  Clinical Data: Lower GI bleed  NUCLEAR MEDICINE GASTROINTESTINAL BLEEDING STUDY  Technique:  Sequential abdominal images were obtained following intravenous administration of Tc-66m labeled red blood cells.  Radiopharmaceutical: CURIE ULTRATAG TECHNETIUM TC 55M- LABELED RED BLOOD CELLS IV KIT  Comparison: None  Findings: Sequential images were obtained for 2 hours. Normal blood pool distribution of tracer is identified. Small amounts of excreted tracer seen within the kidneys and bladder. A faint area of tracer accumulation is seen at the right flank and during the first hour but becomes less evident during the second  hour. This does not demonstrate normal peristalsis through bowel. This could represent a site of abnormal blood pool distribution though a subtle focus of GI bleeding is not definitely excluded. No other sites of abnormal tracer accumulation are identified.   IMPRESSION: Negative GI bleeding study.  Original Report Authenticated By: Lollie Marrow, M.D.   EGD showed a hiatal hernia no active bleeding Colonoscopy showed fresh blood in the colon no active site of bleeding.  Labs: Basic Metabolic Panel:  Lab 10/14/11 1610 10/13/11 0130 10/12/11 0329 10/11/11 1100  NA 142 143 141 136  K 3.8 3.8 3.9 4.2  CL 107 109 107 104  CO2 28 26 25 23   GLUCOSE 123* 94 126* 156*  BUN 8 8 14  27*  CREATININE 1.25 1.11 1.12 1.36*  CALCIUM 8.7 8.0* 7.9* 8.6  MG -- -- -- --  PHOS -- -- -- --   Liver Function Tests:  Lab 10/11/11 1100  AST 13  ALT 12  ALKPHOS 45  BILITOT 0.3  PROT 6.4  ALBUMIN 3.7   No results found for this basename: LIPASE:5,AMYLASE:5 in the last 168 hours No results found for this basename: AMMONIA:5 in the last 168 hours CBC:  Lab 10/14/11 0530 10/13/11 2100 10/13/11 0950 10/13/11 0130 10/12/11 2237 10/12/11 1800 10/11/11 1100  WBC 5.9 -- 6.2 6.2 7.4 7.2 --  NEUTROABS -- -- -- -- -- -- 7.9*  HGB 9.2* 9.7* 7.8* 7.4* 7.9* -- --  HCT 26.9* 27.6* 22.5* 21.9* 22.7* -- --  MCV 88.8 -- 88.6 88.0 87.3 87.2 --  PLT 181 -- 160 149* 157 150 --      Time coordinating discharge is approximately 45 minutes Signed:  Molli Posey, MD [facility] 10/14/2011, 9:54 AM

## 2011-10-19 ENCOUNTER — Ambulatory Visit (INDEPENDENT_AMBULATORY_CARE_PROVIDER_SITE_OTHER): Payer: Medicare Other | Admitting: Family Medicine

## 2011-10-19 ENCOUNTER — Encounter: Payer: Self-pay | Admitting: Family Medicine

## 2011-10-19 VITALS — BP 122/72 | Temp 98.2°F | Wt 248.0 lb

## 2011-10-19 DIAGNOSIS — D649 Anemia, unspecified: Secondary | ICD-10-CM

## 2011-10-19 DIAGNOSIS — K5790 Diverticulosis of intestine, part unspecified, without perforation or abscess without bleeding: Secondary | ICD-10-CM

## 2011-10-19 DIAGNOSIS — K5730 Diverticulosis of large intestine without perforation or abscess without bleeding: Secondary | ICD-10-CM

## 2011-10-19 NOTE — Progress Notes (Signed)
  Subjective:    Patient ID: Carl Lam, male    DOB: [birth year], 66 y.o.   MRN: [id number]  HPI  Hospital followup. Patient was seen here with bloody stools and dizziness. Was admitted with probable lower GI bleed. Colonoscopy confirmed diverticulosis throughout the right colon. He eventually received a couple units of packed red blood cells. Discharge hemoglobin low 9 range. Has seen no further bloody stools. Patient also had EGD which was unremarkable. Nuclear medicine scan for GI blood loss unremarkable. Patient had been taking aspirin this was discontinued. He feels well this time. No dyspnea. No dizziness. No orthostasis. No abdominal pain. Stools are normal.  Past Medical History  Diagnosis Date  . HYPERLIPIDEMIA 04/27/2009    Qualifier: Diagnosis of  By: Caryl Never MD, Deedee Lybarger    . HYPERTENSION 10/27/2008    Qualifier: Diagnosis of  By: Gabriel Rung LPN, Harriett Sine    . DEGENERATIVE JOINT DISEASE 10/27/2008    Qualifier: Diagnosis of  By: Caryl Never MD, Beena Catano    . 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 Acute lower GI bleeding 10/11/11   Past Surgical History  Procedure Date  . Total hip arthroplasty 2007  . Hernia repair 2007    "navel"  . Joint replacement   . Knee cartilage surgery 1960's and 1970's    left; "2 total"  . Esophagogastroduodenoscopy 10/12/2011    Procedure: ESOPHAGOGASTRODUODENOSCOPY (EGD);  Surgeon: Hart Carwin, MD;  Location: [facility] ENDOSCOPY;  Service: Endoscopy;  Laterality: N/A;  . Colonoscopy 10/12/2011    Procedure: COLONOSCOPY;  Surgeon: Hart Carwin, MD;  Location: [facility] ENDOSCOPY;  Service: Endoscopy;  Laterality: N/A;    reports that he quit smoking about 33 years ago. His smoking use included Cigarettes. He has a 4.5 pack-year smoking history. He has never used smokeless tobacco. He reports that he drinks alcohol. He reports that he uses illicit drugs (Marijuana). family history includes  Alcohol abuse in his other; Arthritis in his other; and Diabetes in his other. No Known Allergies    Review of Systems  Constitutional: Negative for fever, chills, appetite change and unexpected weight change.  Respiratory: Negative for shortness of breath.   Cardiovascular: Negative for chest pain.  Gastrointestinal: Negative for nausea, vomiting, abdominal pain, diarrhea, constipation and blood in stool.  Neurological: Negative for dizziness.       Objective:   Physical Exam  Constitutional: He is oriented to person, place, and time. He appears well-developed and well-nourished.  Neck: Neck supple.  Cardiovascular: Normal rate and regular rhythm.   Pulmonary/Chest: Effort normal and breath sounds normal. No respiratory distress. He has no wheezes. He has no rales.  Abdominal: Soft. He exhibits no distension. There is no tenderness.  Musculoskeletal: He exhibits no edema.  Neurological: He is alert and oriented to person, place, and time.          Assessment & Plan:  Recent gastrointestinal bleed with diverticulosis mostly right colon. Continue to leave off aspirin. Measures to reduce constipation. Iron rich diet. Recheck CBC in 2 weeks. Followup promptly for any recurrent dizziness.

## 2011-10-19 NOTE — Patient Instructions (Addendum)
Diverticulosis Diverticulosis is a common condition that develops when small pouches (diverticula) form in the wall of the colon. The risk of diverticulosis increases with age. It happens more often in people who eat a low-fiber diet. Most individuals with diverticulosis have no symptoms. Those individuals with symptoms usually experience abdominal pain, constipation, or loose stools (diarrhea). HOME CARE INSTRUCTIONS   Increase the amount of fiber in your diet as directed by your caregiver or dietician. This may reduce symptoms of diverticulosis.   Your caregiver may recommend taking a dietary fiber supplement.   Drink at least 6 to 8 glasses of water each day to prevent constipation.   Try not to strain when you have a bowel movement.   Your caregiver may recommend avoiding nuts and seeds to prevent complications, although this is still an uncertain benefit.   Only take over-the-counter or prescription medicines for pain, discomfort, or fever as directed by your caregiver.  FOODS WITH HIGH FIBER CONTENT INCLUDE:  Fruits. Apple, peach, pear, tangerine, raisins, prunes.   Vegetables. Brussels sprouts, asparagus, broccoli, cabbage, carrot, cauliflower, romaine lettuce, spinach, summer squash, tomato, winter squash, zucchini.   Starchy Vegetables. Baked beans, kidney beans, lima beans, split peas, lentils, potatoes (with skin).   Grains. Whole wheat bread, brown rice, bran flake cereal, plain oatmeal, white rice, shredded wheat, bran muffins.  SEEK IMMEDIATE MEDICAL CARE IF:   You develop increasing pain or severe bloating.   You have an oral temperature above 102 F (38.9 C), not controlled by medicine.   You develop vomiting or bowel movements that are bloody or black.  Document Released: 12/29/2003 Document Revised: 03/22/2011 Document Reviewed: 08/31/2009 [facility] Patient Information 2012 [place], [place].  Iron-Rich Diet An iron-rich diet contains foods that are good sources of  iron. Iron is an important mineral that helps your body produce hemoglobin. Hemoglobin is a protein in red blood cells that carries oxygen to the body's tissues. Sometimes, the iron level in your blood can be low. This may be caused by:  A lack of iron in your diet.   Blood loss.   Times of growth, such as during pregnancy or during a child's growth and development.  Low levels of iron can cause a decrease in the number of red blood cells. This can result in iron deficiency anemia. Iron deficiency anemia symptoms include:  Tiredness.   Weakness.   Irritability.   Increased chance of infection.  Here are some recommendations for daily iron intake:  Males older than 65 years of age need 8 mg of iron per day.   Women ages 38 to 28 need 18 mg of iron per day.   Pregnant women need 27 mg of iron per day, and women who are over 51 years of age and breastfeeding need 9 mg of iron per day.   Women over the age of 75 need 8 mg of iron per day.  SOURCES OF IRON There are 2 types of iron that are found in food: heme iron and nonheme iron. Heme iron is absorbed by the body better than nonheme iron. Heme iron is found in meat, poultry, and fish. Nonheme iron is found in grains, beans, and vegetables. Heme Iron Sources Food / Iron (mg)  Chicken liver, 3 oz (85 g)/ 10 mg   Beef liver, 3 oz (85 g)/ 5.5 mg   Oysters, 3 oz (85 g)/ 8 mg   Beef, 3 oz (85 g)/ 2 to 3 mg   Shrimp, 3 oz (85 g)/ 2.8  mg   [place], 3 oz (85 g)/ 2 mg   Chicken, 3 oz (85 g) / 1 mg   Fish (tuna, halibut), 3 oz (85 g)/ 1 mg   Pork, 3 oz (85 g)/ 0.9 mg  Nonheme Iron Sources Food / Iron (mg)  Ready-to-eat breakfast cereal, iron-fortified / 3.9 to 7 mg   Tofu,  cup / 3.4 mg   Kidney beans,  cup / 2.6 mg   Baked potato with skin / 2.7 mg   Asparagus,  cup / 2.2 mg   Avocado / 2 mg   Dried peaches,  cup / 1.6 mg   Raisins,  cup / 1.5 mg   Soy milk, 1 cup / 1.5 mg   Whole-wheat bread, 1 slice / 1.2  mg   Spinach, 1 cup / 0.8 mg   Broccoli,  cup / 0.6 mg  IRON ABSORPTION Certain foods can decrease the body's absorption of iron. Try to avoid these foods and beverages while eating meals with iron-containing foods:  Coffee.   Tea.   Fiber.   Soy.  Foods containing vitamin C can help increase the amount of iron your body absorbs from iron sources, especially from nonheme sources. Eat foods with vitamin C along with iron-containing foods to increase your iron absorption. Foods that are high in vitamin C include many fruits and vegetables. Some good sources are:  Fresh orange juice.   Oranges.   Strawberries.   Mangoes.   Grapefruit.   Red bell peppers.   Green bell peppers.   Broccoli.   Potatoes with skin.   Tomato juice.  Document Released: 11/14/2004 Document Revised: 03/22/2011 Document Reviewed: 09/21/2010 [facility] Patient Information 2012 [place], [place].

## 2011-11-02 ENCOUNTER — Other Ambulatory Visit (INDEPENDENT_AMBULATORY_CARE_PROVIDER_SITE_OTHER): Payer: Medicare Other

## 2011-11-02 DIAGNOSIS — D649 Anemia, unspecified: Secondary | ICD-10-CM

## 2011-11-02 LAB — CBC WITH DIFFERENTIAL/PLATELET
Basophils Relative: 0.8 % (ref 0.0–3.0)
Eosinophils Absolute: 0.1 10*3/uL (ref 0.0–0.7)
Eosinophils Relative: 2.4 % (ref 0.0–5.0)
Hemoglobin: 10.4 g/dL — ABNORMAL LOW (ref 13.0–17.0)
Lymphocytes Relative: 23 % (ref 12.0–46.0)
MCHC: 32.7 g/dL (ref 30.0–36.0)
Monocytes Relative: 6 % (ref 3.0–12.0)
Neutro Abs: 3.5 10*3/uL (ref 1.4–7.7)
RBC: 3.61 Mil/uL — ABNORMAL LOW (ref 4.22–5.81)
WBC: 5.2 10*3/uL (ref 4.5–10.5)

## 2012-01-08 ENCOUNTER — Ambulatory Visit: Payer: Federal, State, Local not specified - PPO | Admitting: Family Medicine

## 2012-01-11 ENCOUNTER — Ambulatory Visit (INDEPENDENT_AMBULATORY_CARE_PROVIDER_SITE_OTHER): Payer: Medicare Other | Admitting: Family Medicine

## 2012-01-11 ENCOUNTER — Encounter: Payer: Self-pay | Admitting: Family Medicine

## 2012-01-11 VITALS — BP 122/80 | Temp 98.0°F | Wt 236.0 lb

## 2012-01-11 DIAGNOSIS — K5730 Diverticulosis of large intestine without perforation or abscess without bleeding: Secondary | ICD-10-CM

## 2012-01-11 DIAGNOSIS — D62 Acute posthemorrhagic anemia: Secondary | ICD-10-CM | POA: Diagnosis not present

## 2012-01-11 DIAGNOSIS — K5790 Diverticulosis of intestine, part unspecified, without perforation or abscess without bleeding: Secondary | ICD-10-CM

## 2012-01-11 LAB — CBC WITH DIFFERENTIAL/PLATELET
Basophils Relative: 0.9 % (ref 0.0–3.0)
Eosinophils Absolute: 0.1 10*3/uL (ref 0.0–0.7)
HCT: 41.9 % (ref 39.0–52.0)
Hemoglobin: 13.2 g/dL (ref 13.0–17.0)
Lymphs Abs: 1.8 10*3/uL (ref 0.7–4.0)
MCHC: 31.5 g/dL (ref 30.0–36.0)
MCV: 81.8 fl (ref 78.0–100.0)
Monocytes Absolute: 0.5 10*3/uL (ref 0.1–1.0)
Neutro Abs: 3.3 10*3/uL (ref 1.4–7.7)
RBC: 5.12 Mil/uL (ref 4.22–5.81)
RDW: 19.9 % — ABNORMAL HIGH (ref 11.5–14.6)

## 2012-01-11 NOTE — Progress Notes (Signed)
  Subjective:    Patient ID: Carl Lam, male    DOB: 03-11-1946, 66 y.o.   MRN: [id number]  HPI  Patient here for followup of anemia. Diverticulosis bleed back early summer. He stopped aspirin after that.  Back in July hemoglobin still 10 range. Back to exercising regularly. Good exercise tolerance. No dizziness. No headaches. No chest pains. No dyspnea. He denies any abdominal pain or any stool changes. No recent constipation issues  He has history of hypertension and type 2 diabetes which has been controlled off medication with lifestyle changes   Review of Systems  Constitutional: Negative for appetite change.  Respiratory: Negative for cough and shortness of breath.   Cardiovascular: Negative for chest pain.  Gastrointestinal: Negative for abdominal pain and blood in stool.  Neurological: Negative for dizziness and syncope.       Objective:   Physical Exam  Constitutional: He appears well-developed and well-nourished.  Cardiovascular: Normal rate and regular rhythm.   Pulmonary/Chest: Effort normal and breath sounds normal. No respiratory distress. He has no wheezes. He has no rales.  Abdominal: Soft. There is no tenderness.          Assessment & Plan:  Anemia secondary to acute gastrointestinal loss from diverticulosis bleed. Clinically stable. Recheck CBC. Continue to avoid aspirin. Consider complete physical within the next year

## 2012-01-14 NOTE — Progress Notes (Signed)
Quick Note:  Pt informed on personally identified VM ______ 

## 2012-01-19 ENCOUNTER — Encounter: Payer: Self-pay | Admitting: Family Medicine

## 2012-01-19 ENCOUNTER — Ambulatory Visit (INDEPENDENT_AMBULATORY_CARE_PROVIDER_SITE_OTHER): Payer: Medicare Other | Admitting: Family Medicine

## 2012-01-19 VITALS — BP 120/88 | HR 68 | Temp 98.0°F | Wt 230.0 lb

## 2012-01-19 DIAGNOSIS — IMO0002 Reserved for concepts with insufficient information to code with codable children: Secondary | ICD-10-CM | POA: Diagnosis not present

## 2012-01-19 MED ORDER — CEPHALEXIN 500 MG PO CAPS: 500.0000 mg | ORAL_CAPSULE | Freq: Three times a day (TID) | ORAL | Status: DC

## 2012-01-19 NOTE — Progress Notes (Signed)
No injury but R thumb paronychia with pain but no fever for 5 days.  No other complaints.    Meds, vitals, and allergies reviewed.   ROS: See HPI.  Otherwise, noncontributory.  nad r thumb with uncomplicated paronychia w/o nail undermining.    Digital block 1% lidocaine w/o epi, with betadine prep, lanced with 18g needle and pus expressed.   Tolerated w/o complication.   Normal ROM at the IP joint, no joint erythema

## 2012-01-19 NOTE — Assessment & Plan Note (Signed)
Drained, good result, start keflex and f/u prn.  Tolerated well.  No complications.

## 2012-01-19 NOTE — Patient Instructions (Signed)
Keep the area clean and covered.  Elevate you hand.  Wash with soap and water.  Take the antibiotics 3 times a day.  If you have a fever or more swelling, then see your regular doctor. Take care.

## 2012-07-11 ENCOUNTER — Encounter: Payer: Self-pay | Admitting: Family Medicine

## 2012-07-11 ENCOUNTER — Ambulatory Visit (INDEPENDENT_AMBULATORY_CARE_PROVIDER_SITE_OTHER): Payer: Medicare Other | Admitting: Family Medicine

## 2012-07-11 VITALS — BP 128/78 | Temp 97.6°F | Wt 247.0 lb

## 2012-07-11 DIAGNOSIS — E119 Type 2 diabetes mellitus without complications: Secondary | ICD-10-CM | POA: Diagnosis not present

## 2012-07-11 DIAGNOSIS — D649 Anemia, unspecified: Secondary | ICD-10-CM

## 2012-07-11 DIAGNOSIS — I10 Essential (primary) hypertension: Secondary | ICD-10-CM

## 2012-07-11 DIAGNOSIS — E785 Hyperlipidemia, unspecified: Secondary | ICD-10-CM

## 2012-07-11 LAB — BASIC METABOLIC PANEL
CO2: 28 mEq/L (ref 19–32)
Chloride: 101 mEq/L (ref 96–112)
GFR: 63.62 mL/min (ref >60.00)
Glucose, Bld: 153 mg/dL — ABNORMAL HIGH (ref 70–99)
Potassium: 4.4 mEq/L (ref 3.5–5.1)
Sodium: 135 mEq/L (ref 135–145)

## 2012-07-11 LAB — CBC WITH DIFFERENTIAL/PLATELET
Basophils Relative: 0.8 % (ref 0.0–3.0)
Eosinophils Relative: 2.3 % (ref 0.0–5.0)
HCT: 43.7 % (ref 39.0–52.0)
Hemoglobin: 14.7 g/dL (ref 13.0–17.0)
Lymphs Abs: 1.6 10*3/uL (ref 0.7–4.0)
Monocytes Relative: 8.5 % (ref 3.0–12.0)
Neutro Abs: 2.2 10*3/uL (ref 1.4–7.7)
Platelets: 237 10*3/uL (ref 150.0–400.0)
RBC: 5.01 Mil/uL (ref 4.22–5.81)
WBC: 4.3 10*3/uL — ABNORMAL LOW (ref 4.5–10.5)

## 2012-07-11 LAB — LDL CHOLESTEROL, DIRECT: Direct LDL: 168.2 mg/dL

## 2012-07-11 LAB — HEMOGLOBIN A1C: Hgb A1c MFr Bld: 7.1 % — ABNORMAL HIGH (ref 4.6–6.5)

## 2012-07-11 NOTE — Patient Instructions (Addendum)
Consider physical at follow up in 6 months

## 2012-07-11 NOTE — Progress Notes (Signed)
  Subjective:    Patient ID: Carl Lam, male    DOB: [birth year], 67 y.o.   MRN: [id number]  HPI Medical followup.  Patient has history of hypertension, type 2 diabetes, hyperlipidemia. Diverticulosis bleed last summer. He stopped his aspirin he has done well since then. No dizziness. No recurrent bleeds.  Patient has gained some weight over the winter time. Not monitoring blood sugars. No symptoms of hyperglycemia. Patient basically was able take off antihypertensives, diabetic medications, and statin because of his lifestyle management weight loss. Still exercising but much less than a year ago  Past Medical History  Diagnosis Date  . HYPERLIPIDEMIA 04/27/2009    Qualifier: Diagnosis of  By: Caryl Never MD, Franziska Podgurski    . HYPERTENSION 10/27/2008    Qualifier: Diagnosis of  By: Gabriel Rung LPN, Harriett Sine    . DEGENERATIVE JOINT DISEASE 10/27/2008    Qualifier: Diagnosis of  By: Caryl Never MD, Laneshia Pina    . 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 Acute lower GI bleeding 10/11/11   Past Surgical History  Procedure Laterality Date  . Total hip arthroplasty  2007  . Hernia repair  2007    "navel"  . Joint replacement    . Knee cartilage surgery  1960's and 1970's    left; "2 total"  . Esophagogastroduodenoscopy  10/12/2011    Procedure: ESOPHAGOGASTRODUODENOSCOPY (EGD);  Surgeon: Hart Carwin, MD;  Location: [facility] ENDOSCOPY;  Service: Endoscopy;  Laterality: N/A;  . Colonoscopy  10/12/2011    Procedure: COLONOSCOPY;  Surgeon: Hart Carwin, MD;  Location: [facility] ENDOSCOPY;  Service: Endoscopy;  Laterality: N/A;    reports that he quit smoking about 33 years ago. His smoking use included Cigarettes. He has a 4.5 pack-year smoking history. He has never used smokeless tobacco. He reports that  drinks alcohol. He reports that he uses illicit drugs (Marijuana). family history includes Alcohol abuse in his other; Arthritis in his other;  and Diabetes in his other. No Known Allergies    Review of Systems  Constitutional: Negative for fatigue and unexpected weight change.  Eyes: Negative for visual disturbance.  Respiratory: Negative for cough, chest tightness and shortness of breath.   Cardiovascular: Negative for chest pain, palpitations and leg swelling.  Gastrointestinal: Negative for abdominal pain and blood in stool.  Endocrine: Negative for polydipsia and polyuria.  Neurological: Negative for dizziness, syncope, weakness, light-headedness and headaches.       Objective:   Physical Exam  Constitutional: He appears well-developed and well-nourished.  Neck: Neck supple. No thyromegaly present.  Cardiovascular: Normal rate and regular rhythm.   Pulmonary/Chest: Effort normal and breath sounds normal. No respiratory distress. He has no wheezes. He has no rales.  Musculoskeletal: He exhibits no edema.  Lymphadenopathy:    He has no cervical adenopathy.          Assessment & Plan:  #1 history of diverticulosis. Recheck CBC. No evidence for recurrent bleed. Continue to avoid aspirin #2 type 2 diabetes history. Recheck hemoglobin A1c #3 hypertension which is currently managed off medication. Continue exercise and weight loss efforts #4 history of hyperlipidemia. Recheck lipid panel

## 2012-07-15 ENCOUNTER — Other Ambulatory Visit: Payer: Self-pay | Admitting: *Deleted

## 2012-07-15 DIAGNOSIS — E119 Type 2 diabetes mellitus without complications: Secondary | ICD-10-CM

## 2012-07-15 DIAGNOSIS — E785 Hyperlipidemia, unspecified: Secondary | ICD-10-CM

## 2012-07-15 NOTE — Progress Notes (Signed)
Quick Note:  Pt informed and he declined taking the Lipitor. He IS going to loose the weight and have his labs repeated in 3 months as requested. FYI ______

## 2012-09-22 DIAGNOSIS — H2510 Age-related nuclear cataract, unspecified eye: Secondary | ICD-10-CM | POA: Diagnosis not present

## 2013-01-19 ENCOUNTER — Ambulatory Visit (INDEPENDENT_AMBULATORY_CARE_PROVIDER_SITE_OTHER): Payer: Medicare Other | Admitting: Family Medicine

## 2013-01-19 ENCOUNTER — Encounter: Payer: Self-pay | Admitting: Family Medicine

## 2013-01-19 VITALS — BP 120/70 | HR 68 | Temp 97.5°F | Wt 236.0 lb

## 2013-01-19 DIAGNOSIS — Z0000 Encounter for general adult medical examination without abnormal findings: Secondary | ICD-10-CM | POA: Diagnosis not present

## 2013-01-19 DIAGNOSIS — N320 Bladder-neck obstruction: Secondary | ICD-10-CM | POA: Diagnosis not present

## 2013-01-19 DIAGNOSIS — M1990 Unspecified osteoarthritis, unspecified site: Secondary | ICD-10-CM

## 2013-01-19 DIAGNOSIS — E119 Type 2 diabetes mellitus without complications: Secondary | ICD-10-CM | POA: Diagnosis not present

## 2013-01-19 DIAGNOSIS — E785 Hyperlipidemia, unspecified: Secondary | ICD-10-CM

## 2013-01-19 DIAGNOSIS — I10 Essential (primary) hypertension: Secondary | ICD-10-CM | POA: Diagnosis not present

## 2013-01-19 LAB — BASIC METABOLIC PANEL
CO2: 26 mEq/L (ref 19–32)
Chloride: 106 mEq/L (ref 96–112)
Creatinine, Ser: 1.3 mg/dL (ref 0.4–1.5)

## 2013-01-19 LAB — HEMOGLOBIN A1C: Hgb A1c MFr Bld: 6.2 % (ref 4.6–6.5)

## 2013-01-19 LAB — PSA: PSA: 1.54 ng/mL (ref 0.10–4.00)

## 2013-01-19 LAB — LIPID PANEL
LDL Cholesterol: 116 mg/dL — ABNORMAL HIGH (ref 0–99)
Total CHOL/HDL Ratio: 3
Triglycerides: 38 mg/dL (ref 0.0–149.0)

## 2013-01-19 LAB — HEPATIC FUNCTION PANEL
Albumin: 4.1 g/dL (ref 3.5–5.2)
Alkaline Phosphatase: 47 U/L (ref 39–117)

## 2013-01-19 NOTE — Progress Notes (Signed)
Subjective:    Patient ID: Carl Lam, male    DOB: 07/18/1945, 67 y.o.   MRN: [id number]  HPI Patient here for Medicare wellness exam and medical followup Has chronic problems include history of type 2 diabetes, hypertension, hyperlipidemia, diverticulosis, osteoarthritis. He has done excellent job over the years with lifestyle management. He exercises 1 hour daily. Has lost 11 pounds since last visit. No symptoms of hyperglycemia When first diagnosed with diabetes his blood sugars were over 600 and for a short while he was on insulin. He is had several vaccines including tetanus, shingles flu vaccine, or Pneumovax but he declines all these.  He had diverticulosis bleed last year and has been stable since then.  Past Medical History  Diagnosis Date  . HYPERLIPIDEMIA 04/27/2009    Qualifier: Diagnosis of  By: Caryl Never MD, Bruce    . HYPERTENSION 10/27/2008    Qualifier: Diagnosis of  By: Gabriel Rung LPN, Harriett Sine    . DEGENERATIVE JOINT DISEASE 10/27/2008    Qualifier: Diagnosis of  By: Caryl Never MD, Bruce    . 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 Acute lower GI bleeding 10/11/11   Past Surgical History  Procedure Laterality Date  . Total hip arthroplasty  2007  . Hernia repair  2007    "navel"  . Joint replacement    . Knee cartilage surgery  1960's and 1970's    left; "2 total"  . Esophagogastroduodenoscopy  10/12/2011    Procedure: ESOPHAGOGASTRODUODENOSCOPY (EGD);  Surgeon: Hart Carwin, MD;  Location: [facility] ENDOSCOPY;  Service: Endoscopy;  Laterality: N/A;  . Colonoscopy  10/12/2011    Procedure: COLONOSCOPY;  Surgeon: Hart Carwin, MD;  Location: [facility] ENDOSCOPY;  Service: Endoscopy;  Laterality: N/A;    reports that he quit smoking about 34 years ago. His smoking use included Cigarettes. He has a 4.5 pack-year smoking history. He has never used smokeless tobacco. He reports that  drinks alcohol. He reports  that he uses illicit drugs (Marijuana). family history includes Alcohol abuse in his other; Arthritis in his other; Diabetes in his other. No Known Allergies  1.  Risk factors based on Past Medical , Social, and Family history reviewed and as above 2.  Limitations in physical activities very active. 3.  Depression/mood no depression anxiety issues 4.  Hearing no deficits 5.  ADLs independent in all 6.  Cognitive function (orientation to time and place, language, writing, speech,memory) no language deficits. Memory is intact. Speech intact.  7.  Home Safety no issues identified 8.  Height, weight, and visual acuity. Recent weight loss due to his efforts. Vision is intact 9.  Counseling discussed the importance of continued exercise and weight loss 10. Recommendation of preventive services. We have recommended multiple vaccines including tetanus, shingles, Pneumovax, and fluid he declines all of these 11. Labs based on risk factors lipid, hepatic, basic metabolic panel, PSA, hemoglobin A1c 12. Care Plan as above     Review of Systems  Constitutional: Negative for fever, activity change, appetite change and fatigue.  HENT: Negative for ear pain, congestion and trouble swallowing.   Eyes: Negative for pain and visual disturbance.  Respiratory: Negative for cough, shortness of breath and wheezing.   Cardiovascular: Negative for chest pain and palpitations.  Gastrointestinal: Negative for nausea, vomiting, abdominal pain, diarrhea, constipation, blood in stool, abdominal distention and rectal pain.  Genitourinary: Negative for dysuria, hematuria and testicular  pain.  Musculoskeletal: Negative for joint swelling and arthralgias.  Skin: Negative for rash.  Neurological: Negative for dizziness, syncope and headaches.  Hematological: Negative for adenopathy.  Psychiatric/Behavioral: Negative for confusion and dysphoric mood.       Objective:   Physical Exam  Constitutional: He is oriented  to person, place, and time. He appears well-developed and well-nourished. No distress.  HENT:  Head: Normocephalic and atraumatic.  Right Ear: External ear normal.  Left Ear: External ear normal.  Mouth/Throat: Oropharynx is clear and moist.  Eyes: Conjunctivae and EOM are normal. Pupils are equal, round, and reactive to light.  Neck: Normal range of motion. Neck supple. No thyromegaly present.  Cardiovascular: Normal rate, regular rhythm and normal heart sounds.   No murmur heard. Pulmonary/Chest: No respiratory distress. He has no wheezes. He has no rales.  Abdominal: Soft. Bowel sounds are normal. He exhibits no distension and no mass. There is no tenderness. There is no rebound and no guarding.  Musculoskeletal: He exhibits no edema.  Lymphadenopathy:    He has no cervical adenopathy.  Neurological: He is alert and oriented to person, place, and time. He displays normal reflexes. No cranial nerve deficit.  Skin: No rash noted.  Psychiatric: He has a normal mood and affect. His behavior is normal. Judgment and thought content normal.          Assessment & Plan:  Health maintenance. Recent colonoscopy as above. Multiple immunizations recommended as above and patient declines. Obtain screening lab work. Continue healthy lifestyle habits  Type 2 diabetes which is been managed without medications. Recheck A1c  Hyperlipidemia currently off medication. Repeat lipid and hepatic panel  Hypertension which is managed off medications

## 2013-11-01 IMAGING — NM NM GI BLOOD LOSS
2 series · 12 of 12 positions shown · non-contrast
Comparison: None

CLINICAL DATA: Lower GI bleed

NUCLEAR MEDICINE GASTROINTESTINAL BLEEDING STUDY
TECHNIQUE: Sequential abdominal images were obtained following
intravenous administration of Tc-NNm labeled red blood cells.
Radiopharmaceutical: URIOPPO ARGUETA ULTRATAG TECHNETIUM TC 99M-
LABELED RED BLOOD CELLS IV KIT

[gi gi bleed · 5.01mm/px · 6 of 60 frames shown (1 of 2)]
[frame 6/60]
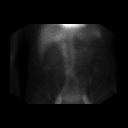
[frame 16/60]
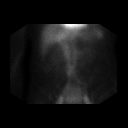
[frame 26/60]
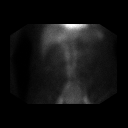
[frame 36/60]
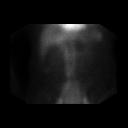
[frame 46/60]
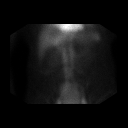
[frame 56/60]
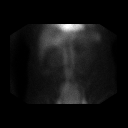

[gi gi bleed · 5.01mm/px · 6 of 60 frames shown (2 of 2)]
[frame 6/60]
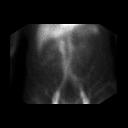
[frame 16/60]
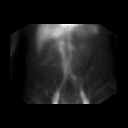
[frame 26/60]
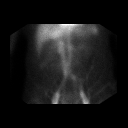
[frame 36/60]
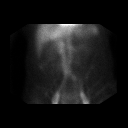
[frame 46/60]
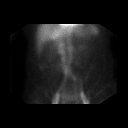
[frame 56/60]
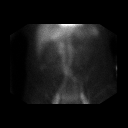

[12 of 12 positions shown; findings below may reference images not displayed]

FINDINGS: Sequential images were obtained for 2 hours.
Normal blood pool distribution of tracer is identified.
Small amounts of excreted tracer seen within the kidneys and
bladder.
A faint area of tracer accumulation is seen at the right flank and
during the first hour but becomes less evident during the second
hour.
This does not demonstrate normal peristalsis through bowel.
This could represent a site of abnormal blood pool distribution
though a subtle focus of GI bleeding is not definitely excluded.
No other sites of abnormal tracer accumulation are identified.
IMPRESSION: Negative GI bleeding study.

## 2014-08-06 ENCOUNTER — Ambulatory Visit (INDEPENDENT_AMBULATORY_CARE_PROVIDER_SITE_OTHER): Payer: Medicare Other | Admitting: Family Medicine

## 2014-08-06 ENCOUNTER — Encounter: Payer: Self-pay | Admitting: Family Medicine

## 2014-08-06 VITALS — BP 138/88 | HR 64 | Temp 97.4°F

## 2014-08-06 DIAGNOSIS — A084 Viral intestinal infection, unspecified: Secondary | ICD-10-CM | POA: Diagnosis not present

## 2014-08-06 MED ORDER — ONDANSETRON HCL 8 MG PO TABS: 8.0000 mg | ORAL_TABLET | Freq: Three times a day (TID) | ORAL | Status: DC | PRN

## 2014-08-06 NOTE — Progress Notes (Signed)
   Subjective:    Patient ID: Carl Lam, male    DOB: Sep 10, 1945, 69 y.o.   MRN: [id number]  HPI Here for the onset yesterday afternoon of generalized abdominal cramps, with nausea and vomiting. No diarrhea or fever. He has had diverticulitis in the past but he says this feels very different to him.   Review of Systems  Constitutional: Negative.   Respiratory: Negative.   Cardiovascular: Negative.   Gastrointestinal: Positive for nausea, vomiting and abdominal pain. Negative for diarrhea, constipation, blood in stool, abdominal distention and anal bleeding.  Genitourinary: Negative.        Objective:   Physical Exam  Constitutional: He appears well-developed and well-nourished. No distress.  Cardiovascular: Normal rate, regular rhythm, normal heart sounds and intact distal pulses.   Pulmonary/Chest: Effort normal and breath sounds normal.  Abdominal: Soft. Bowel sounds are normal. He exhibits no distension and no mass. There is no tenderness. There is no rebound and no guarding.          Assessment & Plan:  Viral enteritis. Given Zofran for the nausea. He will sip fluids and recheck prn

## 2015-06-08 ENCOUNTER — Encounter: Payer: Federal, State, Local not specified - PPO | Admitting: Family Medicine

## 2015-06-24 ENCOUNTER — Encounter: Payer: Self-pay | Admitting: Family Medicine

## 2015-06-24 ENCOUNTER — Ambulatory Visit (INDEPENDENT_AMBULATORY_CARE_PROVIDER_SITE_OTHER): Payer: Medicare Other | Admitting: Family Medicine

## 2015-06-24 ENCOUNTER — Other Ambulatory Visit: Payer: Medicare Other

## 2015-06-24 VITALS — BP 122/90 | HR 92 | Temp 97.8°F | Ht 74.0 in | Wt 208.0 lb

## 2015-06-24 DIAGNOSIS — Z125 Encounter for screening for malignant neoplasm of prostate: Secondary | ICD-10-CM | POA: Diagnosis not present

## 2015-06-24 DIAGNOSIS — R7309 Other abnormal glucose: Secondary | ICD-10-CM | POA: Diagnosis not present

## 2015-06-24 DIAGNOSIS — R739 Hyperglycemia, unspecified: Secondary | ICD-10-CM

## 2015-06-24 DIAGNOSIS — Z0000 Encounter for general adult medical examination without abnormal findings: Secondary | ICD-10-CM

## 2015-06-24 LAB — CBC WITH DIFFERENTIAL/PLATELET
BASOS PCT: 0.7 % (ref 0.0–3.0)
Basophils Absolute: 0 10*3/uL (ref 0.0–0.1)
EOS PCT: 1.4 % (ref 0.0–5.0)
Eosinophils Absolute: 0 10*3/uL (ref 0.0–0.7)
HEMATOCRIT: 43.5 % (ref 39.0–52.0)
Hemoglobin: 14.7 g/dL (ref 13.0–17.0)
LYMPHS ABS: 1.5 10*3/uL (ref 0.7–4.0)
LYMPHS PCT: 40.3 % (ref 12.0–46.0)
MCHC: 33.8 g/dL (ref 30.0–36.0)
MCV: 90.8 fl (ref 78.0–100.0)
MONOS PCT: 7.8 % (ref 3.0–12.0)
Monocytes Absolute: 0.3 10*3/uL (ref 0.1–1.0)
NEUTROS PCT: 49.8 % (ref 43.0–77.0)
Neutro Abs: 1.8 10*3/uL (ref 1.4–7.7)
PLATELETS: 227 10*3/uL (ref 150.0–400.0)
RBC: 4.79 Mil/uL (ref 4.22–5.81)
RDW: 14.2 % (ref 11.5–15.5)
WBC: 3.6 10*3/uL — ABNORMAL LOW (ref 4.0–10.5)

## 2015-06-24 LAB — PSA: PSA: 1.99 ng/mL (ref 0.10–4.00)

## 2015-06-24 LAB — HEPATIC FUNCTION PANEL
ALBUMIN: 4.3 g/dL (ref 3.5–5.2)
ALT: 18 U/L (ref 0–53)
AST: 14 U/L (ref 0–37)
Alkaline Phosphatase: 47 U/L (ref 39–117)
Bilirubin, Direct: 0.1 mg/dL (ref 0.0–0.3)
Total Bilirubin: 0.7 mg/dL (ref 0.2–1.2)
Total Protein: 6.9 g/dL (ref 6.0–8.3)

## 2015-06-24 LAB — BASIC METABOLIC PANEL
BUN: 17 mg/dL (ref 6–23)
CO2: 29 mEq/L (ref 19–32)
Calcium: 9.4 mg/dL (ref 8.4–10.5)
Chloride: 102 mEq/L (ref 96–112)
Creatinine, Ser: 1.18 mg/dL (ref 0.40–1.50)
GFR: 78.71 mL/min (ref >60.00)
Glucose, Bld: 247 mg/dL — ABNORMAL HIGH (ref 70–99)
Potassium: 4 mEq/L (ref 3.5–5.1)
SODIUM: 139 meq/L (ref 135–145)

## 2015-06-24 LAB — HM DIABETES EYE EXAM

## 2015-06-24 LAB — LIPID PANEL
CHOLESTEROL: 252 mg/dL — AB (ref 0–200)
HDL: 60.3 mg/dL (ref >39.00)
LDL Cholesterol: 179 mg/dL — ABNORMAL HIGH (ref 0–99)
NonHDL: 191.29
TRIGLYCERIDES: 61 mg/dL (ref 0.0–149.0)
Total CHOL/HDL Ratio: 4
VLDL: 12.2 mg/dL (ref 0.0–40.0)

## 2015-06-24 LAB — TSH: TSH: 1.2 u[IU]/mL (ref 0.35–4.50)

## 2015-06-24 LAB — HM DIABETES FOOT EXAM: HM Diabetic Foot Exam: NORMAL

## 2015-06-24 LAB — HEMOGLOBIN A1C: HEMOGLOBIN A1C: 12.1 % — AB (ref 4.6–6.5)

## 2015-06-24 NOTE — Progress Notes (Signed)
Pre visit review using our clinic review tool, if applicable. No additional management support is needed unless otherwise documented below in the visit note. 

## 2015-06-24 NOTE — Progress Notes (Signed)
Subjective:    Patient ID: Carl Lam, male    DOB: 13-Oct-1945, 70 y.o.   MRN: [id number]  HPI  Patient seen for complete physical.  He has past history of hypertension, dyslipidemia, and type 2 diabetes and basically has come off all medications because of excellent compliance with exercise and diet. His weight is down about 30 pounds from last visit.  He exercises 2-1/2 hours most days of the week.  Does not monitor blood sugars regularly.   He is due for several immunizations including tetanus, pneumonia vaccine, shingles vaccine and he declines all of these. Colonoscopy up-to-date.  Past Medical History  Diagnosis Date  . HYPERLIPIDEMIA 04/27/2009    Qualifier: Diagnosis of  By: Elease Hashimoto MD, Geraldina Parrott    . HYPERTENSION 10/27/2008    Qualifier: Diagnosis of  By: Valma Cava LPN, Izora Gala    . DEGENERATIVE JOINT DISEASE 10/27/2008    Qualifier: Diagnosis of  By: Elease Hashimoto MD, Jennika Ringgold    . DIAB W/O COMP TYPE II/UNS NOT STATED UNCNTRL 10/27/2008    Qualifier: Diagnosis of  By: Valma Cava LPN, Izora Gala    . Acute lower GI bleeding 10/11/11   Past Surgical History  Procedure Laterality Date  . Total hip arthroplasty  2007  . Hernia repair  2007    "navel"  . Joint replacement    . Knee cartilage surgery  1960's and 1970's    left; "2 total"  . Esophagogastroduodenoscopy  10/12/2011    Procedure: ESOPHAGOGASTRODUODENOSCOPY (EGD);  Surgeon: Lafayette Dragon, MD;  Location: [facility] ENDOSCOPY;  Service: Endoscopy;  Laterality: N/A;  . Colonoscopy  10/12/2011    Procedure: COLONOSCOPY;  Surgeon: Lafayette Dragon, MD;  Location: [facility] ENDOSCOPY;  Service: Endoscopy;  Laterality: N/A;    reports that he quit smoking about 36 years ago. His smoking use included Cigarettes. He has a 4.5 pack-year smoking history. He has never used smokeless tobacco. He reports that he drinks alcohol. He reports that he uses illicit drugs (Marijuana). family history includes Alcohol abuse in his other; Arthritis in his other; Diabetes  in his other. No Known Allergies    Review of Systems  Constitutional: Negative for fever, activity change, appetite change, fatigue and unexpected weight change.  HENT: Negative for congestion, ear pain and trouble swallowing.   Eyes: Negative for pain and visual disturbance.  Respiratory: Negative for cough, shortness of breath and wheezing.   Cardiovascular: Negative for chest pain and palpitations.  Gastrointestinal: Negative for nausea, vomiting, abdominal pain, diarrhea, constipation, blood in stool, abdominal distention and rectal pain.  Endocrine: Negative for polydipsia and polyuria.  Genitourinary: Negative for dysuria, hematuria and testicular pain.  Musculoskeletal: Negative for joint swelling and arthralgias.  Skin: Negative for rash.  Neurological: Negative for dizziness, syncope and headaches.  Hematological: Negative for adenopathy.  Psychiatric/Behavioral: Negative for confusion and dysphoric mood.       Objective:   Physical Exam  Constitutional: He is oriented to person, place, and time. He appears well-developed and well-nourished. No distress.  HENT:  Head: Normocephalic and atraumatic.  Right Ear: External ear normal.  Left Ear: External ear normal.  Mouth/Throat: Oropharynx is clear and moist.  Eyes: Conjunctivae and EOM are normal. Pupils are equal, round, and reactive to light.  Neck: Normal range of motion. Neck supple. No thyromegaly present.  Cardiovascular: Normal rate, regular rhythm and normal heart sounds.   No murmur heard. Pulmonary/Chest: No respiratory distress. He has no wheezes. He has no rales.  Abdominal: Soft. Bowel sounds are  normal. He exhibits no distension and no mass. There is no tenderness. There is no rebound and no guarding.  Musculoskeletal: He exhibits no edema.  Lymphadenopathy:    He has no cervical adenopathy.  Neurological: He is alert and oriented to person, place, and time. He displays normal reflexes. No cranial nerve  deficit.  Skin: No rash noted.  Psychiatric: He has a normal mood and affect.          Assessment & Plan:  Physical exam. Obtain screening lab work. Include hepatitis C screening. Patient declines several immunizations including tetanus, shingles vaccine, and pneumonia vaccine. He is aware of risk of not getting these.  The natural history of prostate cancer and ongoing controversy regarding screening and potential treatment outcomes of prostate cancer has been discussed with the patient. The meaning of a false positive PSA and a false negative PSA has been discussed. He indicates understanding of the limitations of this screening test and wishes to proceed with screening PSA testing.

## 2015-06-25 LAB — HEMOGLOBIN A1C
Hgb A1c MFr Bld: 13.8 % — ABNORMAL HIGH (ref <5.7)
Mean Plasma Glucose: 349 mg/dL — ABNORMAL HIGH (ref <117)

## 2015-06-25 LAB — HEPATITIS C ANTIBODY: HCV Ab: NEGATIVE

## 2017-07-15 ENCOUNTER — Telehealth: Payer: Self-pay | Admitting: *Deleted

## 2017-07-15 ENCOUNTER — Encounter: Payer: Self-pay | Admitting: Family Medicine

## 2017-07-15 ENCOUNTER — Ambulatory Visit (INDEPENDENT_AMBULATORY_CARE_PROVIDER_SITE_OTHER): Payer: Federal, State, Local not specified - PPO | Admitting: Family Medicine

## 2017-07-15 VITALS — BP 128/88 | HR 100 | Temp 97.5°F | Ht 72.5 in | Wt 173.2 lb

## 2017-07-15 DIAGNOSIS — R634 Abnormal weight loss: Secondary | ICD-10-CM | POA: Diagnosis not present

## 2017-07-15 DIAGNOSIS — Z0000 Encounter for general adult medical examination without abnormal findings: Secondary | ICD-10-CM

## 2017-07-15 DIAGNOSIS — Z8639 Personal history of other endocrine, nutritional and metabolic disease: Secondary | ICD-10-CM | POA: Diagnosis not present

## 2017-07-15 LAB — CBC WITH DIFFERENTIAL/PLATELET
Basophils Absolute: 0.1 10*3/uL (ref 0.0–0.1)
Basophils Relative: 1.2 % (ref 0.0–3.0)
EOS PCT: 0.6 % (ref 0.0–5.0)
Eosinophils Absolute: 0 10*3/uL (ref 0.0–0.7)
HCT: 44.2 % (ref 39.0–52.0)
Hemoglobin: 15 g/dL (ref 13.0–17.0)
LYMPHS ABS: 1.3 10*3/uL (ref 0.7–4.0)
Lymphocytes Relative: 26 % (ref 12.0–46.0)
MCHC: 33.9 g/dL (ref 30.0–36.0)
MCV: 90.9 fl (ref 78.0–100.0)
MONOS PCT: 7.4 % (ref 3.0–12.0)
Monocytes Absolute: 0.4 10*3/uL (ref 0.1–1.0)
NEUTROS ABS: 3.2 10*3/uL (ref 1.4–7.7)
Neutrophils Relative %: 64.8 % (ref 43.0–77.0)
PLATELETS: 279 10*3/uL (ref 150.0–400.0)
RBC: 4.86 Mil/uL (ref 4.22–5.81)
RDW: 14.2 % (ref 11.5–15.5)
WBC: 4.9 10*3/uL (ref 4.0–10.5)

## 2017-07-15 LAB — BASIC METABOLIC PANEL
BUN: 23 mg/dL (ref 6–23)
CO2: 28 meq/L (ref 19–32)
Calcium: 9.7 mg/dL (ref 8.4–10.5)
Chloride: 92 mEq/L — ABNORMAL LOW (ref 96–112)
Creatinine, Ser: 1.34 mg/dL (ref 0.40–1.50)
GFR: 67.56 mL/min (ref >60.00)
Glucose, Bld: 527 mg/dL (ref 70–99)
POTASSIUM: 4.7 meq/L (ref 3.5–5.1)
SODIUM: 132 meq/L — AB (ref 135–145)

## 2017-07-15 LAB — LIPID PANEL
CHOL/HDL RATIO: 4
Cholesterol: 231 mg/dL — ABNORMAL HIGH (ref 0–200)
HDL: 61.1 mg/dL (ref >39.00)
LDL Cholesterol: 139 mg/dL — ABNORMAL HIGH (ref 0–99)
NonHDL: 170.13
TRIGLYCERIDES: 158 mg/dL — AB (ref 0.0–149.0)
VLDL: 31.6 mg/dL (ref 0.0–40.0)

## 2017-07-15 LAB — TSH: TSH: 2.66 u[IU]/mL (ref 0.35–4.50)

## 2017-07-15 LAB — MICROALBUMIN / CREATININE URINE RATIO
Creatinine,U: 29 mg/dL
MICROALB UR: 2.6 mg/dL — AB (ref 0.0–1.9)
Microalb Creat Ratio: 9.1 mg/g (ref 0.0–30.0)

## 2017-07-15 LAB — HEPATIC FUNCTION PANEL
ALBUMIN: 4 g/dL (ref 3.5–5.2)
ALT: 11 U/L (ref 0–53)
AST: 11 U/L (ref 0–37)
Alkaline Phosphatase: 67 U/L (ref 39–117)
Bilirubin, Direct: 0.1 mg/dL (ref 0.0–0.3)
TOTAL PROTEIN: 7 g/dL (ref 6.0–8.3)
Total Bilirubin: 0.8 mg/dL (ref 0.2–1.2)

## 2017-07-15 LAB — HEMOGLOBIN A1C: Hgb A1c MFr Bld: 13.9 % — ABNORMAL HIGH (ref 4.6–6.5)

## 2017-07-15 NOTE — Progress Notes (Signed)
Subjective:     Patient ID: Carl Lam, male   DOB: October 13, 1945, 72 y.o.   MRN: [id number]  HPI Patient is here for medical follow-up. He has past history of hypertension, type 2 diabetes, hyperlipidemia, osteoarthritis. Several years ago he had obesity and had poorly controlled diabetes and basically made major lifestyle changes and was able come off all diabetic medications. He came back here couple years ago and had elevated A1c was started back on metformin but for some reason was lost to follow-up. He is back today and has lost about 25 pounds since last year. He states is exercising hour day with walking on treadmill 45 minutes daily and also doing about 10 minutes of resistance training daily. He states he feels "good" overall. He has occasional urine frequency. No thirst.  Currently takes no medications. He smoked from around age [age] about 6 pack year history. No history of abdominal aortic Anderson screening. Colonoscopy up-to-date. He declines vaccines including Prevnar 13.  Past Medical History:  Diagnosis Date  . Acute lower GI bleeding 10/11/11  . DEGENERATIVE JOINT DISEASE 10/27/2008   Qualifier: Diagnosis of  By: Elease Hashimoto MD, [place] Lasseter    . DIAB W/O COMP TYPE II/UNS NOT STATED UNCNTRL 10/27/2008   Qualifier: Diagnosis of  By: Valma Cava LPN, Izora Gala    . HYPERLIPIDEMIA 04/27/2009   Qualifier: Diagnosis of  By: Elease Hashimoto MD, Edon Hoadley    . HYPERTENSION 10/27/2008   Qualifier: Diagnosis of  By: Valma Cava LPN, Izora Gala     Past Surgical History:  Procedure Laterality Date  . COLONOSCOPY  10/12/2011   Procedure: COLONOSCOPY;  Surgeon: Lafayette Dragon, MD;  Location: [facility] ENDOSCOPY;  Service: Endoscopy;  Laterality: N/A;  . ESOPHAGOGASTRODUODENOSCOPY  10/12/2011   Procedure: ESOPHAGOGASTRODUODENOSCOPY (EGD);  Surgeon: Lafayette Dragon, MD;  Location: [facility] ENDOSCOPY;  Service: Endoscopy;  Laterality: N/A;  . HERNIA REPAIR  2007   "navel"  . JOINT REPLACEMENT    . KNEE CARTILAGE SURGERY  1960's and 1970's    left; "2 total"  . TOTAL HIP ARTHROPLASTY  2007    reports that he quit smoking about 38 years ago. His smoking use included cigarettes. He has a 4.50 pack-year smoking history. He has never used smokeless tobacco. He reports that he drinks alcohol. He reports that he has current or past drug history. Drug: Marijuana. family history includes Alcohol abuse in his other; Arthritis in his other; Diabetes in his other. No Known Allergies   Review of Systems  Constitutional: Negative for activity change, appetite change, fatigue and fever.  HENT: Negative for congestion, ear pain and trouble swallowing.   Eyes: Negative for pain and visual disturbance.  Respiratory: Negative for cough, shortness of breath and wheezing.   Cardiovascular: Negative for chest pain and palpitations.  Gastrointestinal: Negative for abdominal distention, abdominal pain, blood in stool, constipation, diarrhea, nausea, rectal pain and vomiting.  Endocrine: Negative for cold intolerance, heat intolerance and polydipsia.  Genitourinary: Negative for dysuria, hematuria and testicular pain.  Musculoskeletal: Negative for arthralgias and joint swelling.  Skin: Negative for rash.  Neurological: Negative for dizziness, syncope, weakness and headaches.  Hematological: Negative for adenopathy. Does not bruise/bleed easily.  Psychiatric/Behavioral: Negative for confusion and dysphoric mood.       Objective:   Physical Exam  Constitutional: He is oriented to person, place, and time. He appears well-developed and well-nourished. No distress.  HENT:  Head: Normocephalic and atraumatic.  Right Ear: External ear normal.  Left Ear: External ear normal.  Mouth/Throat: Oropharynx  is clear and moist.  Eyes: Pupils are equal, round, and reactive to light. Conjunctivae and EOM are normal.  Neck: Normal range of motion. Neck supple. No thyromegaly present.  Cardiovascular: Normal rate, regular rhythm and normal heart sounds.  No  murmur heard. Pulmonary/Chest: No respiratory distress. He has no wheezes. He has no rales.  Abdominal: Soft. Bowel sounds are normal. He exhibits no distension. There is no tenderness. There is no rebound and no guarding.  Patient has some subcutaneous firm to palpation tissue which he states has been present ever since his umbilical hernia repair and unchanged  Musculoskeletal: He exhibits no edema.  Lymphadenopathy:    He has no cervical adenopathy.  Neurological: He is alert and oriented to person, place, and time. He displays normal reflexes. No cranial nerve deficit.  Skin: No rash noted.  Feet reveal no skin lesions. Good distal foot pulses. Good capillary refill. Does have a callused area around the mid arch right foot which he states then present for over 30 years and nonpainful. No ulcers. Normal sensation with monofilament testing   Psychiatric: He has a normal mood and affect.       Assessment:     Physical exam. Patient has had substantial weight loss recently been difficult to sort out whether this is intentional or unintentional. He states he feels better than he has in years and is exercising regularly. I am concerned however because of his past history of diabetes whether his could largely reflect poorly controlled diabetes    Plan:     -Obtain further labs including chemistries, lipid panel, urine microalbumin, hemoglobin A1c, hepatic panel -We discussed abdominal aortic aneurysm screening given past history of smoking and he would like to pursue that -Is not a candidate for low-dose lung cancer CT scanning because of less than 30 pack year history and also quit smoking about 40 years ago -We've recommended Prevnar 13 and he declines -Will schedule follow-up after get his labs back to determine urgency  Eulas Post MD [facility]

## 2017-07-15 NOTE — Patient Instructions (Signed)
I would like for you you to schedule a Medicare Annual Wellness Visit (AWV).   This is a yearly appointment with our Health Coach (Susan Hauck, RN) and is designed to develop a personalized prevention plan. This is not a head to toe physical, but rather an opportunity to prevent illness based on your current health and risk factors for disease.   Visits usually last 30-60 minutes and include various screenings for hearing, vision, depression, and dementia, falls, and safety concerns. The visit also includes diet and exercise counseling and information about advance directives.   This is also an opportunity to discuss appropriate health maintenance testing such as mammography, colonoscopy, lung cancer screening, and hepatitis C testing.   The AWV is fully covered by Medicare Part B if:  . You have had Part B for over 12 months, AND . You have not had an AWV in the past 12 months .  Please don't miss out on this opportunity! Set up your appointment today!  

## 2017-07-15 NOTE — Progress Notes (Signed)
[id number]

## 2017-07-15 NOTE — Telephone Encounter (Signed)
CRITICAL VALUE STICKER  CRITICAL VALUE: [phone]  EFUWTKTC (on-site recipient of call): Honor Loh  DATE & TIME NOTIFIED:  07/15/17   MESSENGER (representative from lab):Honor Loh  MD NOTIFIED: Dr Elease Hashimoto   TIME OF NOTIFICATION:2:50 pm   RESPONSE: patient to be called to schedule a follow up appointment tomorrow.    Left message on machine for patient to return our call.  CRM created

## 2017-07-15 NOTE — Telephone Encounter (Signed)
Appointment made

## 2017-07-16 ENCOUNTER — Encounter: Payer: Self-pay | Admitting: Family Medicine

## 2017-07-16 ENCOUNTER — Ambulatory Visit: Payer: Federal, State, Local not specified - PPO | Admitting: Family Medicine

## 2017-07-16 VITALS — BP 140/98 | HR 116 | Temp 97.6°F | Wt 176.7 lb

## 2017-07-16 DIAGNOSIS — Z8639 Personal history of other endocrine, nutritional and metabolic disease: Secondary | ICD-10-CM

## 2017-07-16 DIAGNOSIS — R739 Hyperglycemia, unspecified: Secondary | ICD-10-CM

## 2017-07-16 DIAGNOSIS — E782 Mixed hyperlipidemia: Secondary | ICD-10-CM | POA: Diagnosis not present

## 2017-07-16 LAB — POCT GLUCOSE (DEVICE FOR HOME USE): POC GLUCOSE: 500 mg/dL — AB (ref 70–99)

## 2017-07-16 MED ORDER — METFORMIN HCL 500 MG PO TABS: 500.0000 mg | ORAL_TABLET | Freq: Two times a day (BID) | ORAL | 11 refills | Status: DC

## 2017-07-16 MED ORDER — ATORVASTATIN CALCIUM 20 MG PO TABS: 20.0000 mg | ORAL_TABLET | Freq: Every day | ORAL | 11 refills | Status: DC

## 2017-07-16 NOTE — Progress Notes (Signed)
Subjective:     Patient ID: Carl Lam, male   DOB: [birth year], 72 y.o.   MRN: [id number]  HPI Patient was seen yesterday for physical after not being seen here about 2 years. History of type 2 diabetes and had basically stopped all medications. We noted that he had lost substantial amount of weight recently though he states he felt "great" and been exercising fairly regularly. He did have some polyuria but no polydipsia.  With obtained several labs and came back with nonfasting blood sugar 527, sodium 132, cholesterol 231, triglycerides 158, LDL 139. Creatinine 1.34. CBC was normal. TSH normal. A1c 13.9. He does not have any nausea or vomiting and no recent fever.. We had him come back in today to discuss initiating some insulin    Past Medical History:  Diagnosis Date  . Acute lower GI bleeding 10/11/11  . DEGENERATIVE JOINT DISEASE 10/27/2008   Qualifier: Diagnosis of  By: Elease Hashimoto MD, Taquila Leys    . DIAB W/O COMP TYPE II/UNS NOT STATED UNCNTRL 10/27/2008   Qualifier: Diagnosis of  By: Valma Cava LPN, Izora Gala    . HYPERLIPIDEMIA 04/27/2009   Qualifier: Diagnosis of  By: Elease Hashimoto MD, Namira Rosekrans    . HYPERTENSION 10/27/2008   Qualifier: Diagnosis of  By: Valma Cava LPN, Izora Gala     Past Surgical History:  Procedure Laterality Date  . COLONOSCOPY  10/12/2011   Procedure: COLONOSCOPY;  Surgeon: Lafayette Dragon, MD;  Location: [facility] ENDOSCOPY;  Service: Endoscopy;  Laterality: N/A;  . ESOPHAGOGASTRODUODENOSCOPY  10/12/2011   Procedure: ESOPHAGOGASTRODUODENOSCOPY (EGD);  Surgeon: Lafayette Dragon, MD;  Location: [facility] ENDOSCOPY;  Service: Endoscopy;  Laterality: N/A;  . HERNIA REPAIR  2007   "navel"  . JOINT REPLACEMENT    . KNEE CARTILAGE SURGERY  1960's and 1970's   left; "2 total"  . TOTAL HIP ARTHROPLASTY  2007    reports that he quit smoking about 38 years ago. His smoking use included cigarettes. He has a 4.50 pack-year smoking history. He has never used smokeless tobacco. He reports that he drinks alcohol.  He reports that he has current or past drug history. Drug: Marijuana. family history includes Alcohol abuse in his other; Arthritis in his other; Diabetes in his other. No Known Allergies   Review of Systems  Constitutional: Negative for chills, fatigue and fever.  Eyes: Negative for visual disturbance.  Respiratory: Negative for cough, chest tightness and shortness of breath.   Cardiovascular: Negative for chest pain, palpitations and leg swelling.  Gastrointestinal: Negative for abdominal pain, nausea and vomiting.  Genitourinary: Negative for dysuria.  Neurological: Negative for dizziness, syncope, weakness, light-headedness and headaches.  Psychiatric/Behavioral: Negative for confusion.       Objective:   Physical Exam  Constitutional: He is oriented to person, place, and time. He appears well-developed and well-nourished.  Cardiovascular: Normal rate and regular rhythm.  Pulmonary/Chest: Effort normal and breath sounds normal. No respiratory distress. He has no wheezes. He has no rales.  Neurological: He is alert and oriented to person, place, and time.  Skin: No rash noted.       Assessment:     #1 type 2 diabetes very poorly controlled with nonfasting blood sugar yesterday 529. A1c 13.9%. CBG today 4 hours postprandial 500  #2 hyperlipidemia. Patient currently not on statin  #3 mild pseudohyponatremia related to hyperglycemia  #4 elevated blood pressure     Plan:     -We addressed initiating insulin. Patient initially expressed reluctance but we explained that with this degree  of elevation of blood sugar, need to stabilize blood sugar at least initially with some insulin. -He agreed to starting Basaglar 10 units once daily and will titrate up 2 units every couple days until fasting 130 -Start back metformin 500 mg twice daily- -stay well-hydrated with water  -Start Lipitor 20 mg once daily  -Will likely need blood pressure medication but would like to get him back  for follow-up in 2 weeks and reassess then and get blood sugar stabilized. Consider ACE inhibitor or angiotensin receptor blocker that point if blood pressure not improved  -Patient will check with insurance to see which type of glucose monitor is preferred and we recommend regular monitoring of blood sugars   Eulas Post MD [facility] at [facility]

## 2017-07-16 NOTE — Patient Instructions (Signed)
Start the Basaglar 10 units once daily and may increase by 2 units every 3 days until fasting sugars down around 130. Set up 2 week follow up.

## 2017-07-19 ENCOUNTER — Ambulatory Visit (HOSPITAL_COMMUNITY)
Admission: RE | Admit: 2017-07-19 | Discharge: 2017-07-19 | Disposition: A | Discharge status: Home / Self Care | Payer: Federal, State, Local not specified - PPO | Source: Ambulatory Visit | Attending: Cardiovascular Disease | Admitting: Cardiovascular Disease

## 2017-07-19 DIAGNOSIS — I708 Atherosclerosis of other arteries: Secondary | ICD-10-CM | POA: Insufficient documentation

## 2017-07-19 DIAGNOSIS — I723 Aneurysm of iliac artery: Secondary | ICD-10-CM | POA: Insufficient documentation

## 2017-07-19 DIAGNOSIS — Z0000 Encounter for general adult medical examination without abnormal findings: Secondary | ICD-10-CM | POA: Diagnosis not present

## 2017-07-30 ENCOUNTER — Encounter: Payer: Self-pay | Admitting: Family Medicine

## 2017-07-30 ENCOUNTER — Ambulatory Visit: Payer: Federal, State, Local not specified - PPO | Admitting: Family Medicine

## 2017-07-30 VITALS — BP 140/90 | HR 71 | Temp 97.4°F | Wt 185.2 lb

## 2017-07-30 DIAGNOSIS — E1165 Type 2 diabetes mellitus with hyperglycemia: Secondary | ICD-10-CM

## 2017-07-30 DIAGNOSIS — IMO0002 Reserved for concepts with insufficient information to code with codable children: Secondary | ICD-10-CM

## 2017-07-30 DIAGNOSIS — I10 Essential (primary) hypertension: Secondary | ICD-10-CM | POA: Diagnosis not present

## 2017-07-30 DIAGNOSIS — E11621 Type 2 diabetes mellitus with foot ulcer: Secondary | ICD-10-CM | POA: Diagnosis not present

## 2017-07-30 DIAGNOSIS — S91301A Unspecified open wound, right foot, initial encounter: Secondary | ICD-10-CM

## 2017-07-30 DIAGNOSIS — E782 Mixed hyperlipidemia: Secondary | ICD-10-CM | POA: Diagnosis not present

## 2017-07-30 DIAGNOSIS — L97409 Non-pressure chronic ulcer of unspecified heel and midfoot with unspecified severity: Secondary | ICD-10-CM

## 2017-07-30 MED ORDER — LISINOPRIL 10 MG PO TABS: 10.0000 mg | ORAL_TABLET | Freq: Every day | ORAL | 3 refills | Status: DC

## 2017-07-30 NOTE — Progress Notes (Signed)
Subjective:     Patient ID: Carl Lam, male   DOB: [birth year], 72 y.o.   MRN: [id number]  HPI Patient here to follow-up type 2 diabetes. Recent blood sugar > 500. We reinitiated metformin 500 mg twice daily and start Basaglar insulin currently at 18 units. He is titrating up 2 units every 3 days until fasting blood sugars around 130. Recent fasting blood sugars around 200. He feels much better overall. He has gained about 9 pounds back which we explained would likely happen as his blood sugar stabilized.  He does relate issue of right foot sore which first noticed after his last visit. He states this became a bit more swollen fairly rapidly and then spontaneously drained some purulence. He's had no redness or warmth or fever since then. Has been applying some topical alcohol.  No pain.   Recent dyslipidemia. Initiated Lipitor no side effects. His blood pressure was also elevated and we had him back for follow-up today to reinitiate blood pressure medication if still elevated  Past Medical History:  Diagnosis Date  . Acute lower GI bleeding 10/11/11  . DEGENERATIVE JOINT DISEASE 10/27/2008   Qualifier: Diagnosis of  By: Elease Hashimoto MD, Selia Wareing    . DIAB W/O COMP TYPE II/UNS NOT STATED UNCNTRL 10/27/2008   Qualifier: Diagnosis of  By: Valma Cava LPN, Izora Gala    . HYPERLIPIDEMIA 04/27/2009   Qualifier: Diagnosis of  By: Elease Hashimoto MD, Ugochi Henzler    . HYPERTENSION 10/27/2008   Qualifier: Diagnosis of  By: Valma Cava LPN, Izora Gala     Past Surgical History:  Procedure Laterality Date  . COLONOSCOPY  10/12/2011   Procedure: COLONOSCOPY;  Surgeon: Lafayette Dragon, MD;  Location: [facility] ENDOSCOPY;  Service: Endoscopy;  Laterality: N/A;  . ESOPHAGOGASTRODUODENOSCOPY  10/12/2011   Procedure: ESOPHAGOGASTRODUODENOSCOPY (EGD);  Surgeon: Lafayette Dragon, MD;  Location: [facility] ENDOSCOPY;  Service: Endoscopy;  Laterality: N/A;  . HERNIA REPAIR  2007   "navel"  . JOINT REPLACEMENT    . KNEE CARTILAGE SURGERY  1960's and 1970's    left; "2 total"  . TOTAL HIP ARTHROPLASTY  2007    reports that he quit smoking about 38 years ago. His smoking use included cigarettes. He has a 4.50 pack-year smoking history. He has never used smokeless tobacco. He reports that he drinks alcohol. He reports that he has current or past drug history. Drug: Marijuana. family history includes Alcohol abuse in his other; Arthritis in his other; Diabetes in his other. No Known Allergies   Review of Systems  Constitutional: Negative for chills, fatigue and fever.  Eyes: Negative for visual disturbance.  Respiratory: Negative for cough, chest tightness and shortness of breath.   Cardiovascular: Negative for chest pain, palpitations and leg swelling.  Genitourinary: Negative for dysuria.  Neurological: Negative for dizziness, syncope, weakness, light-headedness and headaches.       Objective:   Physical Exam  Constitutional: He is oriented to person, place, and time. He appears well-developed and well-nourished.  HENT:  Right Ear: External ear normal.  Left Ear: External ear normal.  Mouth/Throat: Oropharynx is clear and moist.  Eyes: Pupils are equal, round, and reactive to light.  Neck: Neck supple. No thyromegaly present.  Cardiovascular: Normal rate and regular rhythm.  Pulmonary/Chest: Effort normal and breath sounds normal. No respiratory distress. He has no wheezes. He has no rales.  Musculoskeletal: He exhibits no edema.  Neurological: He is alert and oriented to person, place, and time.  Skin:  Patient has wound dorsum right foot  laterally which is 1 x 1 cm. No surrounding erythema or warmth. No purulent drainage. No fluctuance.  Feet are warm to touch with good capillary refill throughout       Assessment:     #1 type 2 diabetes with recent very poor control now stabilizing on insulin and metformin  #2 hypertension currently not treated and not to goal  #3 dyslipidemia-recent initiation of Lipitor and tolerating  well  #4 open wound right foot. By history sounds like he had abscess that spontaneously drained. He does not have any residual pus evident and no cellulitis changes currently    Plan:     -Leave off alcohol topically -Clean foot daily with good antibacterial soap such as Lever 2000 or dial -Follow-up immediately for signs of progressive infection such as fever, pus drainage, warmth, or swelling -Start lisinopril 10 mg daily -Continue slow titration of insulin 2 units every 3 days to fasting blood sugars around 130 -Reassess in 2 weeks and sooner free signs of infection  Eulas Post MD [facility]

## 2017-07-30 NOTE — Patient Instructions (Signed)
Clean foot daily with soap and water. Good anti-bacterial soap such as Lever 2000 and Dial.  Follow up immediately for any increased swelling, redness, or warmth or pus drainage  Continue to titrate up insulin 2 units every 3 days until sugars around 130 fasting.

## 2017-08-13 ENCOUNTER — Encounter: Payer: Self-pay | Admitting: Family Medicine

## 2017-08-13 ENCOUNTER — Ambulatory Visit: Payer: Federal, State, Local not specified - PPO | Admitting: Family Medicine

## 2017-08-13 VITALS — BP 118/78 | HR 96 | Temp 98.1°F | Wt 189.7 lb

## 2017-08-13 DIAGNOSIS — I10 Essential (primary) hypertension: Secondary | ICD-10-CM

## 2017-08-13 DIAGNOSIS — IMO0002 Reserved for concepts with insufficient information to code with codable children: Secondary | ICD-10-CM

## 2017-08-13 DIAGNOSIS — E782 Mixed hyperlipidemia: Secondary | ICD-10-CM | POA: Diagnosis not present

## 2017-08-13 DIAGNOSIS — E11621 Type 2 diabetes mellitus with foot ulcer: Secondary | ICD-10-CM | POA: Diagnosis not present

## 2017-08-13 DIAGNOSIS — L97409 Non-pressure chronic ulcer of unspecified heel and midfoot with unspecified severity: Secondary | ICD-10-CM | POA: Diagnosis not present

## 2017-08-13 DIAGNOSIS — E1165 Type 2 diabetes mellitus with hyperglycemia: Secondary | ICD-10-CM

## 2017-08-13 NOTE — Progress Notes (Signed)
Subjective:     Patient ID: Carl Lam, male   DOB: 1945-05-28, 72 y.o.   MRN: [id number]  HPI Patient for follow-up multiple medical problems. Recent blood sugar > 500. Currently on Basaglar insulin 26 units once daily. Recent fasting blood sugars consistently running 120 or less. No hypoglycemia. Feels much better overall. Weight is back up 4 pounds as expected  Blood pressure recently elevated and we started lisinopril 10 mg daily. Tolerating with no side effects.  Hyperlipidemia. We started atorvastatin 20 mg daily and he is tolerating without side effects.  Recent right foot ulcer. Sounds like he had abscess which drained spontaneously. He's had no further problems with drainage and wound is fully healed.  Past Medical History:  Diagnosis Date  . Acute lower GI bleeding 10/11/11  . DEGENERATIVE JOINT DISEASE 10/27/2008   Qualifier: Diagnosis of  By: Elease Hashimoto MD, Vani Gunner    . DIAB W/O COMP TYPE II/UNS NOT STATED UNCNTRL 10/27/2008   Qualifier: Diagnosis of  By: Valma Cava LPN, Izora Gala    . HYPERLIPIDEMIA 04/27/2009   Qualifier: Diagnosis of  By: Elease Hashimoto MD, Chanese Hartsough    . HYPERTENSION 10/27/2008   Qualifier: Diagnosis of  By: Valma Cava LPN, Izora Gala     Past Surgical History:  Procedure Laterality Date  . COLONOSCOPY  10/12/2011   Procedure: COLONOSCOPY;  Surgeon: Lafayette Dragon, MD;  Location: [facility] ENDOSCOPY;  Service: Endoscopy;  Laterality: N/A;  . ESOPHAGOGASTRODUODENOSCOPY  10/12/2011   Procedure: ESOPHAGOGASTRODUODENOSCOPY (EGD);  Surgeon: Lafayette Dragon, MD;  Location: [facility] ENDOSCOPY;  Service: Endoscopy;  Laterality: N/A;  . HERNIA REPAIR  2007   "navel"  . JOINT REPLACEMENT    . KNEE CARTILAGE SURGERY  1960's and 1970's   left; "2 total"  . TOTAL HIP ARTHROPLASTY  2007    reports that he quit smoking about 38 years ago. His smoking use included cigarettes. He has a 4.50 pack-year smoking history. He has never used smokeless tobacco. He reports that he drinks alcohol. He reports that  he has current or past drug history. Drug: Marijuana. family history includes Alcohol abuse in his other; Arthritis in his other; Diabetes in his other. No Known Allergies   Review of Systems  Constitutional: Negative for fatigue.  Eyes: Negative for visual disturbance.  Respiratory: Negative for cough, chest tightness and shortness of breath.   Cardiovascular: Negative for chest pain, palpitations and leg swelling.  Neurological: Negative for dizziness, syncope, weakness, light-headedness and headaches.       Objective:   Physical Exam  Constitutional: He is oriented to person, place, and time. He appears well-developed and well-nourished.  HENT:  Right Ear: External ear normal.  Left Ear: External ear normal.  Mouth/Throat: Oropharynx is clear and moist.  Eyes: Pupils are equal, round, and reactive to light.  Neck: Neck supple. No thyromegaly present.  Cardiovascular: Normal rate and regular rhythm.  Pulmonary/Chest: Effort normal and breath sounds normal. No respiratory distress. He has no wheezes. He has no rales.  Musculoskeletal: He exhibits no edema.  Neurological: He is alert and oriented to person, place, and time.  Skin:  Small eschar right distal lateral foot. No erythema. No fluctuance. No warmth. Nontender.       Assessment:     #1 type 2 diabetes with recent poor control now stabilized on long-acting insulin. Patient is very compliant with diet and exercise.  Suspect A1C will be greatly improved at follow up.  #2 hypertension improved  #3 dyslipidemia now on statin  #4 recent  right foot wound fully healed      Plan:     -Continue current medications -Continue to monitor blood sugars closely -Recheck in 2 months and will get lipid panel then along with basic chemistries and A1c  Eulas Post MD [facility] at [facility]

## 2017-08-13 NOTE — Patient Instructions (Signed)
Keep insulin at current dose as long as blood sugars consistently < 130.

## 2017-08-29 ENCOUNTER — Telehealth: Payer: Self-pay | Admitting: Family Medicine

## 2017-08-29 NOTE — Telephone Encounter (Signed)
Patient is requesting more samples of Basaglar Carl Lam

## 2017-08-29 NOTE — Telephone Encounter (Signed)
Copied from [place] [id number]. Topic: Quick Communication - Rx Refill/Question >> Aug 29, 2017 11:45 AM Percell Belt A wrote: Medication: Insulin Glargine (BASAGLAR KWIKPEN) 100 UNIT/ML SOPN [[id number]] Has the patient contacted their pharmacy? No  (Agent: If no, request that the patient contact the pharmacy for the refill.) Preferred Pharmacy (with phone number or street name): [facility] off [address]- Dr gave pt samples and told him to return in 3 months but does not have enough sample to make it the 3 months  Agent: Please be advised that RX refills may take up to 3 business days. We ask that you follow-up with your pharmacy.

## 2017-08-30 NOTE — Telephone Encounter (Signed)
Samples of this drug Basaglar were given to the patient, quantity 2 pens, Lot Number [id number] Exp 03/2018  Left message on machine

## 2017-09-23 ENCOUNTER — Telehealth: Payer: Self-pay | Admitting: Family Medicine

## 2017-09-23 NOTE — Telephone Encounter (Signed)
Samples of this drug were given to the patient, 2 pen quantity, Lot Number [id number] Exp 03/2018 Basaglar.   Patient is aware

## 2017-09-23 NOTE — Telephone Encounter (Signed)
Copied from [place] [id number]. Topic: General - Other >> Sep 23, 2017 11:09 AM Carl Lam wrote: Reason for CRM:   pt. Is running out of Insulin Glargine (BASAGLAR KWIKPEN) 100 UNIT/ML SOPN [[id number]] samples and said he was to  Ask for Apolonio Schneiders to get samples He only has about 80 units left

## 2017-10-16 ENCOUNTER — Telehealth: Payer: Self-pay | Admitting: Family Medicine

## 2017-10-16 NOTE — Telephone Encounter (Signed)
Patient can come get samples. Samples of this drug were given to the patient, quantity 2, [id number]

## 2017-10-16 NOTE — Telephone Encounter (Signed)
Apolonio Schneiders please advise if okay to give more samples of Basaglar Pt seems to get them monthly from our office.

## 2017-10-16 NOTE — Telephone Encounter (Signed)
Copied from [place] [id number]. Topic: Inquiry >> Oct 16, 2017 11:57 AM Cecelia Byars, NT wrote: Reason for CRM: Patient called and would like to come by and get samples of basaglar ,he says he is on his last 10  please advise [phone]

## 2017-11-12 ENCOUNTER — Ambulatory Visit: Payer: Federal, State, Local not specified - PPO | Admitting: Family Medicine

## 2017-11-12 ENCOUNTER — Encounter: Payer: Self-pay | Admitting: Family Medicine

## 2017-11-12 VITALS — BP 130/90 | HR 87 | Temp 98.0°F | Wt 185.6 lb

## 2017-11-12 DIAGNOSIS — E11621 Type 2 diabetes mellitus with foot ulcer: Secondary | ICD-10-CM | POA: Diagnosis not present

## 2017-11-12 DIAGNOSIS — L97409 Non-pressure chronic ulcer of unspecified heel and midfoot with unspecified severity: Secondary | ICD-10-CM | POA: Diagnosis not present

## 2017-11-12 DIAGNOSIS — K5900 Constipation, unspecified: Secondary | ICD-10-CM

## 2017-11-12 DIAGNOSIS — I10 Essential (primary) hypertension: Secondary | ICD-10-CM

## 2017-11-12 DIAGNOSIS — E782 Mixed hyperlipidemia: Secondary | ICD-10-CM

## 2017-11-12 DIAGNOSIS — E1165 Type 2 diabetes mellitus with hyperglycemia: Secondary | ICD-10-CM

## 2017-11-12 DIAGNOSIS — L84 Corns and callosities: Secondary | ICD-10-CM | POA: Diagnosis not present

## 2017-11-12 DIAGNOSIS — IMO0002 Reserved for concepts with insufficient information to code with codable children: Secondary | ICD-10-CM

## 2017-11-12 LAB — HEPATIC FUNCTION PANEL
ALK PHOS: 54 U/L (ref 39–117)
ALT: 7 U/L (ref 0–53)
AST: 10 U/L (ref 0–37)
Albumin: 4.3 g/dL (ref 3.5–5.2)
BILIRUBIN TOTAL: 0.9 mg/dL (ref 0.2–1.2)
Bilirubin, Direct: 0.2 mg/dL (ref 0.0–0.3)
Total Protein: 7.3 g/dL (ref 6.0–8.3)

## 2017-11-12 LAB — BASIC METABOLIC PANEL
BUN: 12 mg/dL (ref 6–23)
CALCIUM: 9.8 mg/dL (ref 8.4–10.5)
CO2: 29 mEq/L (ref 19–32)
Chloride: 101 mEq/L (ref 96–112)
Creatinine, Ser: 1.09 mg/dL (ref 0.40–1.50)
GFR: 85.66 mL/min (ref >60.00)
GLUCOSE: 133 mg/dL — AB (ref 70–99)
Potassium: 4.2 mEq/L (ref 3.5–5.1)
SODIUM: 138 meq/L (ref 135–145)

## 2017-11-12 LAB — LIPID PANEL
CHOLESTEROL: 156 mg/dL (ref 0–200)
HDL: 56.3 mg/dL (ref >39.00)
LDL Cholesterol: 87 mg/dL (ref 0–99)
NonHDL: 100.12
Total CHOL/HDL Ratio: 3
Triglycerides: 64 mg/dL (ref 0.0–149.0)
VLDL: 12.8 mg/dL (ref 0.0–40.0)

## 2017-11-12 LAB — POCT GLYCOSYLATED HEMOGLOBIN (HGB A1C): HEMOGLOBIN A1C: 7.9 % — AB (ref 4.0–5.6)

## 2017-11-12 MED ORDER — BASAGLAR KWIKPEN 100 UNIT/ML ~~LOC~~ SOPN: 26.0000 [IU] | PEN_INJECTOR | Freq: Every day | SUBCUTANEOUS | 3 refills | Status: DC

## 2017-11-12 NOTE — Patient Instructions (Signed)
Consider trying Miralax once daily as needed for constipation.

## 2017-11-12 NOTE — Progress Notes (Signed)
Subjective:     Patient ID: Carl Lam, male   DOB: 1945-11-22, 72 y.o.   MRN: [id number]  HPI Patient seen for follow-up. He came back in April with weight loss and blood sugar was extremely out of control with glucose over 500. We initiated insulin at that point and he is currently on Basaglar 26 units once daily. Blood sugars mostly below 130 fasting. He had recent A1c 13.9 and down 7.9 today. We also started back metformin 500 mg twice daily.  He is exercising regularly. Usually does about 45 minutes to one hour on the treadmill daily. No hypoglycemia. He has had some neuropathy symptoms both feet.  Hyperlipidemia and we recently initiated Lipitor 20 mrem daily. Tolerating with no side effects. He is on lisinopril for hypertension and tolerating well. We have reinitiated both of those medications recently  Recent constipation issues. No bloody stools. Had colonoscopy 2013. He's not tried anything for the constipation.  Past Medical History:  Diagnosis Date  . Acute lower GI bleeding 10/11/11  . DEGENERATIVE JOINT DISEASE 10/27/2008   Qualifier: Diagnosis of  By: Elease Hashimoto MD, Mayley Lish    . DIAB W/O COMP TYPE II/UNS NOT STATED UNCNTRL 10/27/2008   Qualifier: Diagnosis of  By: Valma Cava LPN, Izora Gala    . HYPERLIPIDEMIA 04/27/2009   Qualifier: Diagnosis of  By: Elease Hashimoto MD, Airanna Partin    . HYPERTENSION 10/27/2008   Qualifier: Diagnosis of  By: Valma Cava LPN, Izora Gala     Past Surgical History:  Procedure Laterality Date  . COLONOSCOPY  10/12/2011   Procedure: COLONOSCOPY;  Surgeon: Lafayette Dragon, MD;  Location: [facility] ENDOSCOPY;  Service: Endoscopy;  Laterality: N/A;  . ESOPHAGOGASTRODUODENOSCOPY  10/12/2011   Procedure: ESOPHAGOGASTRODUODENOSCOPY (EGD);  Surgeon: Lafayette Dragon, MD;  Location: [facility] ENDOSCOPY;  Service: Endoscopy;  Laterality: N/A;  . HERNIA REPAIR  2007   "navel"  . JOINT REPLACEMENT    . KNEE CARTILAGE SURGERY  1960's and 1970's   left; "2 total"  . TOTAL HIP ARTHROPLASTY  2007     reports that he quit smoking about 39 years ago. His smoking use included cigarettes. He has a 4.50 pack-year smoking history. He has never used smokeless tobacco. He reports that he drinks alcohol. He reports that he has current or past drug history. Drug: Marijuana. family history includes Alcohol abuse in his other; Arthritis in his other; Diabetes in his other. No Known Allergies   Review of Systems  Constitutional: Negative for fatigue.  Eyes: Negative for visual disturbance.  Respiratory: Negative for cough, chest tightness and shortness of breath.   Cardiovascular: Negative for chest pain, palpitations and leg swelling.  Gastrointestinal: Negative for abdominal pain.  Endocrine: Negative for polydipsia and polyuria.  Neurological: Negative for dizziness, syncope, weakness, light-headedness and headaches.       Objective:   Physical Exam  Constitutional: He is oriented to person, place, and time. He appears well-developed and well-nourished.  HENT:  Right Ear: External ear normal.  Left Ear: External ear normal.  Mouth/Throat: Oropharynx is clear and moist.  Eyes: Pupils are equal, round, and reactive to light.  Neck: Neck supple. No thyromegaly present.  Cardiovascular: Normal rate and regular rhythm.  Pulmonary/Chest: Effort normal and breath sounds normal. No respiratory distress. He has no wheezes. He has no rales.  Musculoskeletal: He exhibits no edema.  Neurological: He is alert and oriented to person, place, and time.  Skin:  Patient has no ulcers. He has chronic callus right foot near the heel region.  This is removed with #15 blade. No underlying ulcer. Patient states this callus has been present for many years       Assessment:     #1 type 2 diabetes with improving control with A1c reduction 13.9% to 7.9% with recent initiation of long-acting insulin in addition to metformin  #2 hypertension improved  #3 hyperlipidemia on Lipitor  #4 intermittent  constipation  #5 large callus right foot trimmed as above    Plan:     -Discussed good foot care -Recheck lipid panel, hepatic panel, basic metabolic panel -Consider MiraLAX as needed for constipation symptoms -We discussed possible titration of metformin versus three-month more trial of current medications and recheck A1c in he prefers the latter  Eulas Post MD [facility] at [facility]

## 2017-11-13 ENCOUNTER — Telehealth: Payer: Self-pay | Admitting: Family Medicine

## 2017-11-13 NOTE — Telephone Encounter (Signed)
Left message on machine returning patient's call 

## 2017-11-13 NOTE — Telephone Encounter (Signed)
Copied from [place] [id number] [id number]. Topic: Quick Communication - Lab Results >> Nov 13, 2017  1:29 PM Lamarr Lulas, CMA wrote: Called patient to inform them of  lab results. When patient returns call, triage nurse may disclose results. >> Nov 13, 2017  3:21 PM Wynetta Emery, [place] C wrote: Pt is returning Florien call, I did advise that a nurse ([place]) could go over results, pt is requesting call back from Noel directly.

## 2018-01-08 ENCOUNTER — Ambulatory Visit: Payer: Self-pay | Admitting: *Deleted

## 2018-01-08 NOTE — Telephone Encounter (Signed)
Pt. reports "Burning sensation and hypersensitivity" entire buttocks, lower back, both thighs, onset 2 months ago but worsening past few days. Denies any rash, redness,warmth; afebrile, no SOB.  States areas are sensitive to touch, clothing touching skin. States is constant but varies in intensity, 8/10 max.  Pt. questioning if could be from insulin. States "I think I'm on too much."   No new medications. States sensation does not travel in pathway, rather generalized at areas noted. Appt made with Dr. Elease Hashimoto for Friday per pt's requested date and time. Pt instructed to CB if symptoms worsen, rash presents, any fever or SOB.   Reason for Disposition . Nursing judgment or information in reference  Answer Assessment - Initial Assessment Questions 1. REASON FOR CALL: "What is your main concern right now?"     "Burning sensation" lower back, both thighs, entire buttocks. No rash 2. ONSET: "When did the *No Answer* start?"     2 months ago 3. SEVERITY: "How bad is the *No Answer*?"     8/10 4. FEVER: "Do you have a fever?"     no 5. OTHER SYMPTOMS: "Do you have any other new symptoms?"    Hypersensitivity to touch. 6. INTERVENTIONS AND RESPONSE: "What have you done so far to try to make this better? What medications have you used?"     Aleve, not sure if helped  Protocols used: NO GUIDELINE AVAILABLE-A-AH

## 2018-01-10 ENCOUNTER — Other Ambulatory Visit: Payer: Self-pay

## 2018-01-10 ENCOUNTER — Ambulatory Visit: Payer: Federal, State, Local not specified - PPO | Admitting: Family Medicine

## 2018-01-10 ENCOUNTER — Encounter: Payer: Self-pay | Admitting: Family Medicine

## 2018-01-10 VITALS — BP 140/90 | HR 99 | Temp 98.8°F | Wt 197.0 lb

## 2018-01-10 DIAGNOSIS — R208 Other disturbances of skin sensation: Secondary | ICD-10-CM | POA: Diagnosis not present

## 2018-01-10 MED ORDER — GABAPENTIN 100 MG PO CAPS: 100.0000 mg | ORAL_CAPSULE | Freq: Two times a day (BID) | ORAL | 3 refills | Status: DC

## 2018-01-10 NOTE — Patient Instructions (Signed)
Start the Gabapentin 100 mg one twice daily  After a few days if pain not better, may increase to two tablets twice daily.

## 2018-01-10 NOTE — Progress Notes (Signed)
Subjective:     Patient ID: Carl Lam, male   DOB: 11-Feb-1946, 72 y.o.   MRN: [id number]  HPI Patient seen with bilateral burning sensation which is mostly in his buttock region and seems to go into the thighs and somewhat intermittent but worse at night.  He has had symptoms about 2 months now.  Denies any actual low back pain.  No numbness.  No weakness.  No urine or stool incontinence.  Pain is 8 out of 10 at its worst.  Has taken Aleve a couple times which helps slightly.  Still walking couple miles per day without difficulty  He has type 2 diabetes.  Recent worsening control.  Is now back on metformin and long-acting insulin and fasting sugars recently usually between 100- 130.  Denies any lower extremity pain in the leg region.  His pain is more upper thighs and buttock.  No recent rash  Past Medical History:  Diagnosis Date  . Acute lower GI bleeding 10/11/11  . DEGENERATIVE JOINT DISEASE 10/27/2008   Qualifier: Diagnosis of  By: Elease Hashimoto MD, Airam Runions    . DIAB W/O COMP TYPE II/UNS NOT STATED UNCNTRL 10/27/2008   Qualifier: Diagnosis of  By: Valma Cava LPN, Izora Gala    . HYPERLIPIDEMIA 04/27/2009   Qualifier: Diagnosis of  By: Elease Hashimoto MD, Ameyah Bangura    . HYPERTENSION 10/27/2008   Qualifier: Diagnosis of  By: Valma Cava LPN, Izora Gala     Past Surgical History:  Procedure Laterality Date  . COLONOSCOPY  10/12/2011   Procedure: COLONOSCOPY;  Surgeon: Lafayette Dragon, MD;  Location: [facility] ENDOSCOPY;  Service: Endoscopy;  Laterality: N/A;  . ESOPHAGOGASTRODUODENOSCOPY  10/12/2011   Procedure: ESOPHAGOGASTRODUODENOSCOPY (EGD);  Surgeon: Lafayette Dragon, MD;  Location: [facility] ENDOSCOPY;  Service: Endoscopy;  Laterality: N/A;  . HERNIA REPAIR  2007   "navel"  . JOINT REPLACEMENT    . KNEE CARTILAGE SURGERY  1960's and 1970's   left; "2 total"  . TOTAL HIP ARTHROPLASTY  2007    reports that he quit smoking about 39 years ago. His smoking use included cigarettes. He has a 4.50 pack-year smoking history. He has  never used smokeless tobacco. He reports that he drinks alcohol. He reports that he has current or past drug history. Drug: Marijuana. family history includes Alcohol abuse in his other; Arthritis in his other; Diabetes in his other. No Known Allergies    Review of Systems  Constitutional: Negative for chills and fever.  Neurological: Negative for weakness and numbness.       Objective:   Physical Exam  Constitutional: He appears well-developed and well-nourished.  Cardiovascular: Normal rate.  Pulmonary/Chest: Effort normal and breath sounds normal.  Musculoskeletal: He exhibits no edema.  Right leg raise are negative bilaterally  Neurological:  Trace reflex ankle bilaterally.  Difficult to elicit reflex knee bilaterally.  Full strength lower extremities throughout       Assessment:     Patient presents with bilateral dysesthesia lower extremity mostly in the buttock and upper thigh region.  No focal weakness.  Question neuropathic.  Patient had questions regarding whether metformin could be causing and we explained we did not think so    Plan:     -Consider trial of gabapentin 100 mg 1 twice daily and after a few days if not improved increase to two twice daily.  He has follow-up in October and reassess then.  Follow-up sooner for any weakness or progressive symptoms  Eulas Post MD [facility] at [facility]

## 2018-02-12 ENCOUNTER — Encounter: Payer: Self-pay | Admitting: Family Medicine

## 2018-02-12 ENCOUNTER — Ambulatory Visit: Payer: Federal, State, Local not specified - PPO | Admitting: Family Medicine

## 2018-02-12 ENCOUNTER — Other Ambulatory Visit: Payer: Self-pay

## 2018-02-12 VITALS — BP 120/68 | HR 87 | Temp 98.2°F | Ht 73.0 in | Wt 200.2 lb

## 2018-02-12 DIAGNOSIS — E1165 Type 2 diabetes mellitus with hyperglycemia: Secondary | ICD-10-CM

## 2018-02-12 DIAGNOSIS — E11621 Type 2 diabetes mellitus with foot ulcer: Secondary | ICD-10-CM | POA: Diagnosis not present

## 2018-02-12 DIAGNOSIS — L97409 Non-pressure chronic ulcer of unspecified heel and midfoot with unspecified severity: Secondary | ICD-10-CM

## 2018-02-12 DIAGNOSIS — IMO0002 Reserved for concepts with insufficient information to code with codable children: Secondary | ICD-10-CM

## 2018-02-12 LAB — POCT GLYCOSYLATED HEMOGLOBIN (HGB A1C): HEMOGLOBIN A1C: 7 % — AB (ref 4.0–5.6)

## 2018-02-12 NOTE — Patient Instructions (Signed)
May increase the Gabapentin to two twice daily and let me know if not improved in two weeks.

## 2018-02-12 NOTE — Progress Notes (Signed)
Subjective:     Patient ID: Carl Lam, male   DOB: [birth year], 72 y.o.   MRN: [id number]  HPI  Patient here for follow-up type 2 diabetes.  He had out-of-control diabetes and had basically quit taking all of his medications until several months ago.  He is now back on Basaglar insulin 26 units daily and metformin and blood sugars been ranging usually between 106 and 150 fasting.  A1c today 7.0%.  He has gained some weight since establishing better diabetes control.  He continues to exercise regularly.  He has some burning dysesthesias lower trunk region bilaterally.  Not relieved with gabapentin 100 mg twice daily.  No adverse side effects from medication.  Past Medical History:  Diagnosis Date  . Acute lower GI bleeding 10/11/11  . DEGENERATIVE JOINT DISEASE 10/27/2008   Qualifier: Diagnosis of  By: Elease Hashimoto MD, Bruce    . DIAB W/O COMP TYPE II/UNS NOT STATED UNCNTRL 10/27/2008   Qualifier: Diagnosis of  By: Valma Cava LPN, Izora Gala    . HYPERLIPIDEMIA 04/27/2009   Qualifier: Diagnosis of  By: Elease Hashimoto MD, Bruce    . HYPERTENSION 10/27/2008   Qualifier: Diagnosis of  By: Valma Cava LPN, Izora Gala     Past Surgical History:  Procedure Laterality Date  . COLONOSCOPY  10/12/2011   Procedure: COLONOSCOPY;  Surgeon: Lafayette Dragon, MD;  Location: [facility] ENDOSCOPY;  Service: Endoscopy;  Laterality: N/A;  . ESOPHAGOGASTRODUODENOSCOPY  10/12/2011   Procedure: ESOPHAGOGASTRODUODENOSCOPY (EGD);  Surgeon: Lafayette Dragon, MD;  Location: [facility] ENDOSCOPY;  Service: Endoscopy;  Laterality: N/A;  . HERNIA REPAIR  2007   "navel"  . JOINT REPLACEMENT    . KNEE CARTILAGE SURGERY  1960's and 1970's   left; "2 total"  . TOTAL HIP ARTHROPLASTY  2007    reports that he quit smoking about 39 years ago. His smoking use included cigarettes. He has a 4.50 pack-year smoking history. He has never used smokeless tobacco. He reports that he drinks alcohol. He reports that he has current or past drug history. Drug:  Marijuana. family history includes Alcohol abuse in his other; Arthritis in his other; Diabetes in his other. No Known Allergies  Review of Systems  Constitutional: Negative for appetite change, fatigue and unexpected weight change.  Eyes: Negative for visual disturbance.  Respiratory: Negative for cough, chest tightness and shortness of breath.   Cardiovascular: Negative for chest pain, palpitations and leg swelling.  Neurological: Negative for dizziness, syncope, weakness, light-headedness and headaches.       Objective:   Physical Exam  Constitutional: He is oriented to person, place, and time. He appears well-developed and well-nourished.  HENT:  Right Ear: External ear normal.  Left Ear: External ear normal.  Mouth/Throat: Oropharynx is clear and moist.  Eyes: Pupils are equal, round, and reactive to light.  Neck: Neck supple. No thyromegaly present.  Cardiovascular: Normal rate and regular rhythm.  Pulmonary/Chest: Effort normal and breath sounds normal. No respiratory distress. He has no wheezes. He has no rales.  Musculoskeletal: He exhibits no edema.  Neurological: He is alert and oriented to person, place, and time.       Assessment:     #1 type 2 diabetes improving on long-acting insulin with A1c today 7.0%  #2 burning dysesthesias lower trunk region bilaterally    Plan:     -Patient declines flu vaccination -Continue current medication regimen -We will increase gabapentin to 200 mg twice daily and touch base in 2 weeks if dysesthesias not improving -Plan routine  follow-up in 6 months and sooner as needed  Eulas Post MD [facility] at [facility]

## 2018-04-15 ENCOUNTER — Other Ambulatory Visit: Payer: Self-pay | Admitting: Family Medicine

## 2018-04-17 ENCOUNTER — Other Ambulatory Visit: Payer: Self-pay | Admitting: Family Medicine

## 2018-05-08 ENCOUNTER — Other Ambulatory Visit: Payer: Self-pay | Admitting: Family Medicine

## 2018-06-15 ENCOUNTER — Other Ambulatory Visit: Payer: Self-pay | Admitting: Family Medicine

## 2018-06-17 ENCOUNTER — Other Ambulatory Visit: Payer: Self-pay | Admitting: Family Medicine

## 2018-07-05 ENCOUNTER — Other Ambulatory Visit: Payer: Self-pay | Admitting: Family Medicine

## 2018-07-11 ENCOUNTER — Other Ambulatory Visit: Payer: Self-pay | Admitting: Family Medicine

## 2018-08-05 ENCOUNTER — Other Ambulatory Visit: Payer: Self-pay | Admitting: Family Medicine

## 2018-08-13 ENCOUNTER — Ambulatory Visit: Payer: Federal, State, Local not specified - PPO | Admitting: Family Medicine

## 2018-08-16 ENCOUNTER — Other Ambulatory Visit: Payer: Self-pay | Admitting: Family Medicine

## 2018-09-01 ENCOUNTER — Telehealth: Payer: Self-pay

## 2018-09-01 ENCOUNTER — Other Ambulatory Visit: Payer: Self-pay | Admitting: Family Medicine

## 2018-09-01 NOTE — Telephone Encounter (Signed)
Called patient and left a detailed voice message to let him know that he is due a visit for medication refills and diabetic follow up and he can call back to set up a telephone visit (Doxy if possible).  OK for PEC to discuss/advise/schedule patient for diabetic follow up by telephone.  CRM Created.

## 2018-09-01 NOTE — Telephone Encounter (Signed)
Patient returned call. Patient scheduled for Doxy Visit for tomorrow at 10:30 AM.

## 2018-09-02 ENCOUNTER — Ambulatory Visit (INDEPENDENT_AMBULATORY_CARE_PROVIDER_SITE_OTHER): Payer: Federal, State, Local not specified - PPO | Admitting: Family Medicine

## 2018-09-02 ENCOUNTER — Other Ambulatory Visit: Payer: Self-pay

## 2018-09-02 DIAGNOSIS — R202 Paresthesia of skin: Secondary | ICD-10-CM

## 2018-09-02 DIAGNOSIS — Z794 Long term (current) use of insulin: Secondary | ICD-10-CM

## 2018-09-02 DIAGNOSIS — E782 Mixed hyperlipidemia: Secondary | ICD-10-CM

## 2018-09-02 DIAGNOSIS — I10 Essential (primary) hypertension: Secondary | ICD-10-CM | POA: Diagnosis not present

## 2018-09-02 DIAGNOSIS — E1165 Type 2 diabetes mellitus with hyperglycemia: Secondary | ICD-10-CM | POA: Diagnosis not present

## 2018-09-02 MED ORDER — BASAGLAR KWIKPEN 100 UNIT/ML ~~LOC~~ SOPN: 26.0000 [IU] | PEN_INJECTOR | Freq: Every day | SUBCUTANEOUS | 1 refills | Status: DC

## 2018-09-02 NOTE — Progress Notes (Signed)
Patient ID: Carl Lam, male   DOB: 1945-08-16, 73 y.o.   MRN: [id number]  This visit type was conducted due to national recommendations for restrictions regarding the COVID-19 pandemic in an effort to limit this patient's exposure and mitigate transmission in our community.   Virtual Visit via Telephone Note  I connected with Carl Lam on 09/02/18 at 10:30 AM EDT by telephone and verified that I am speaking with the correct person using two identifiers.   I discussed the limitations, risks, security and privacy concerns of performing an evaluation and management service by telephone and the availability of in person appointments. I also discussed with the patient that there may be a patient responsible charge related to this service. The patient expressed understanding and agreed to proceed.  Location patient: home Location provider: work or home office Participants present for the call: patient, provider Patient did not have a visit in the prior 7 days to address this/these issue(s).   History of Present Illness: Patient has history of hypertension, type 2 diabetes, diverticulosis, hyperlipidemia.  Last year he came in with out-of-control diabetes and basically had come off all his medications.  He had extremely high A1c.  We started him on some basal insulin and back on metformin.  Last A1c last October was 7.0%.  His weight was 200 pounds and.  He is still walking about 7 miles 3-4 times per week but has been poorly compliant with diet.  He states his current weight is up to 225 pounds.  Has had some recent fasting blood sugars occasionally over 200 when his diet has been poorly compliant  He had some paresthesias of the trunk which we suspected were neuropathic last visit.  These have essentially gone away with gabapentin.  He would like to taper back.  He has hyperlipidemia treated with Lipitor.  He is on lisinopril for hypertension.  No recent hypoglycemia.  Past Medical  History:  Diagnosis Date  . Acute lower GI bleeding 10/11/11  . DEGENERATIVE JOINT DISEASE 10/27/2008   Qualifier: Diagnosis of  By: Elease Hashimoto MD, Bruce    . DIAB W/O COMP TYPE II/UNS NOT STATED UNCNTRL 10/27/2008   Qualifier: Diagnosis of  By: Valma Cava LPN, Izora Gala    . HYPERLIPIDEMIA 04/27/2009   Qualifier: Diagnosis of  By: Elease Hashimoto MD, Bruce    . HYPERTENSION 10/27/2008   Qualifier: Diagnosis of  By: Valma Cava LPN, Izora Gala     Past Surgical History:  Procedure Laterality Date  . COLONOSCOPY  10/12/2011   Procedure: COLONOSCOPY;  Surgeon: Lafayette Dragon, MD;  Location: [facility] ENDOSCOPY;  Service: Endoscopy;  Laterality: N/A;  . ESOPHAGOGASTRODUODENOSCOPY  10/12/2011   Procedure: ESOPHAGOGASTRODUODENOSCOPY (EGD);  Surgeon: Lafayette Dragon, MD;  Location: [facility] ENDOSCOPY;  Service: Endoscopy;  Laterality: N/A;  . HERNIA REPAIR  2007   "navel"  . JOINT REPLACEMENT    . KNEE CARTILAGE SURGERY  1960's and 1970's   left; "2 total"  . TOTAL HIP ARTHROPLASTY  2007    reports that he quit smoking about 39 years ago. His smoking use included cigarettes. He has a 4.50 pack-year smoking history. He has never used smokeless tobacco. He reports current alcohol use. He reports current drug use. Drug: Marijuana. family history includes Alcohol abuse in an other family member; Arthritis in an other family member; Diabetes in an other family member. No Known Allergies    Observations/Objective: Patient sounds cheerful and well on the phone. I do not appreciate any SOB. Speech and thought processing are  grossly intact. Patient reported vitals:  Assessment and Plan: #1 type 2 diabetes.  Worsening control by home readings.  Recent weight gain as above.  -Step up compliance with diet and try to lose some weight -Patient has follow-up scheduled for July 1 and will plan to get follow-up labs then -Refill basaglar  #2 paresthesias basically resolved with gabapentin  -Patient will try tapering back to Neurontin 100  mg twice daily and then eventually off if symptoms are stable  #3 hypertension stable  -Continue lisinopril  #4 dyslipidemia  -Continue Lipitor and plan on follow-up labs in July  Follow Up Instructions:  -July 1   [id number] [id number] [id number] 11-20 [id number] 21-30 I did not refer this patient for an OV in the next 24 hours for this/these issue(s).  I discussed the assessment and treatment plan with the patient. The patient was provided an opportunity to ask questions and all were answered. The patient agreed with the plan and demonstrated an understanding of the instructions.   The patient was advised to call back or seek an in-person evaluation if the symptoms worsen or if the condition fails to improve as anticipated.  I provided 22 minutes of non-face-to-face time during this encounter.   Carolann Littler, MD

## 2018-09-16 ENCOUNTER — Other Ambulatory Visit: Payer: Self-pay | Admitting: Family Medicine

## 2018-10-07 ENCOUNTER — Other Ambulatory Visit: Payer: Self-pay | Admitting: Family Medicine

## 2018-10-10 ENCOUNTER — Other Ambulatory Visit: Payer: Self-pay | Admitting: Family Medicine

## 2018-10-15 ENCOUNTER — Ambulatory Visit: Payer: Federal, State, Local not specified - PPO | Admitting: Family Medicine

## 2018-10-15 ENCOUNTER — Encounter: Payer: Self-pay | Admitting: Family Medicine

## 2018-10-15 ENCOUNTER — Other Ambulatory Visit: Payer: Self-pay

## 2018-10-15 VITALS — BP 124/82 | HR 81 | Temp 97.7°F | Ht 73.0 in | Wt 222.4 lb

## 2018-10-15 DIAGNOSIS — E785 Hyperlipidemia, unspecified: Secondary | ICD-10-CM | POA: Diagnosis not present

## 2018-10-15 DIAGNOSIS — E1165 Type 2 diabetes mellitus with hyperglycemia: Secondary | ICD-10-CM

## 2018-10-15 DIAGNOSIS — I10 Essential (primary) hypertension: Secondary | ICD-10-CM

## 2018-10-15 DIAGNOSIS — Z794 Long term (current) use of insulin: Secondary | ICD-10-CM | POA: Diagnosis not present

## 2018-10-15 LAB — HEPATIC FUNCTION PANEL
ALT: 10 U/L (ref 0–53)
AST: 10 U/L (ref 0–37)
Albumin: 4.3 g/dL (ref 3.5–5.2)
Alkaline Phosphatase: 70 U/L (ref 39–117)
Bilirubin, Direct: 0.2 mg/dL (ref 0.0–0.3)
Total Bilirubin: 0.8 mg/dL (ref 0.2–1.2)
Total Protein: 7.2 g/dL (ref 6.0–8.3)

## 2018-10-15 LAB — BASIC METABOLIC PANEL
BUN: 15 mg/dL (ref 6–23)
CO2: 28 mEq/L (ref 19–32)
Calcium: 9.2 mg/dL (ref 8.4–10.5)
Chloride: 103 mEq/L (ref 96–112)
Creatinine, Ser: 1.31 mg/dL (ref 0.40–1.50)
GFR: 65.02 mL/min (ref >60.00)
Glucose, Bld: 193 mg/dL — ABNORMAL HIGH (ref 70–99)
Potassium: 4.4 mEq/L (ref 3.5–5.1)
Sodium: 139 mEq/L (ref 135–145)

## 2018-10-15 LAB — POCT GLYCOSYLATED HEMOGLOBIN (HGB A1C): Hemoglobin A1C: 10.3 % — AB (ref 4.0–5.6)

## 2018-10-15 LAB — LIPID PANEL
Cholesterol: 145 mg/dL (ref 0–200)
HDL: 52 mg/dL (ref >39.00)
LDL Cholesterol: 79 mg/dL (ref 0–99)
NonHDL: 92.98
Total CHOL/HDL Ratio: 3
Triglycerides: 72 mg/dL (ref 0.0–149.0)
VLDL: 14.4 mg/dL (ref 0.0–40.0)

## 2018-10-15 MED ORDER — METFORMIN HCL 500 MG PO TABS: 500.0000 mg | ORAL_TABLET | Freq: Two times a day (BID) | ORAL | 3 refills | Status: DC

## 2018-10-15 MED ORDER — METFORMIN HCL 500 MG PO TABS: ORAL_TABLET | ORAL | 3 refills | Status: DC

## 2018-10-15 NOTE — Progress Notes (Signed)
Subjective:     Patient ID: Carl Lam, male   DOB: [birth year], 73 y.o.   MRN: [id number]  HPI Patient in for medical follow-up.  He has history of type 2 diabetes, hyperlipidemia, and hypertension.  Poor compliance with diet since last visit.  His last A1c was 7.0 in all the way up to 10.3 today.  He has been eating some chocolate and apparently lots of fruit.  He still walking 5 to 6 miles per day.  Compliant with medications.  He remains on metformin 500 mg twice daily and Basaglar insulin 26 units once daily.  Not monitoring home blood sugars recently.  Denies any recent dizziness, headaches, chest pain.  Compliant with medications  Past Medical History:  Diagnosis Date  . Acute lower GI bleeding 10/11/11  . DEGENERATIVE JOINT DISEASE 10/27/2008   Qualifier: Diagnosis of  By: Elease Hashimoto MD, Roselyn Doby    . DIAB W/O COMP TYPE II/UNS NOT STATED UNCNTRL 10/27/2008   Qualifier: Diagnosis of  By: Valma Cava LPN, Izora Gala    . HYPERLIPIDEMIA 04/27/2009   Qualifier: Diagnosis of  By: Elease Hashimoto MD, Glorious Flicker    . HYPERTENSION 10/27/2008   Qualifier: Diagnosis of  By: Valma Cava LPN, Izora Gala     Past Surgical History:  Procedure Laterality Date  . COLONOSCOPY  10/12/2011   Procedure: COLONOSCOPY;  Surgeon: Lafayette Dragon, MD;  Location: [facility] ENDOSCOPY;  Service: Endoscopy;  Laterality: N/A;  . ESOPHAGOGASTRODUODENOSCOPY  10/12/2011   Procedure: ESOPHAGOGASTRODUODENOSCOPY (EGD);  Surgeon: Lafayette Dragon, MD;  Location: [facility] ENDOSCOPY;  Service: Endoscopy;  Laterality: N/A;  . HERNIA REPAIR  2007   "navel"  . JOINT REPLACEMENT    . KNEE CARTILAGE SURGERY  1960's and 1970's   left; "2 total"  . TOTAL HIP ARTHROPLASTY  2007    reports that he quit smoking about 40 years ago. His smoking use included cigarettes. He has a 4.50 pack-year smoking history. He has never used smokeless tobacco. He reports current alcohol use. He reports current drug use. Drug: Marijuana. family history includes Alcohol abuse in an other  family member; Arthritis in an other family member; Diabetes in an other family member. No Known Allergies   Review of Systems  Constitutional: Negative for appetite change, fatigue, fever and unexpected weight change.  Eyes: Negative for visual disturbance.  Respiratory: Negative for cough, chest tightness and shortness of breath.   Cardiovascular: Negative for chest pain, palpitations and leg swelling.  Gastrointestinal: Negative for abdominal pain.  Endocrine: Negative for polyuria.  Genitourinary: Negative for dysuria.  Neurological: Negative for dizziness, syncope, weakness, light-headedness and headaches.       Objective:   Physical Exam Constitutional:      Appearance: He is well-developed.  HENT:     Right Ear: External ear normal.     Left Ear: External ear normal.  Eyes:     Pupils: Pupils are equal, round, and reactive to light.  Neck:     Musculoskeletal: Neck supple.     Thyroid: No thyromegaly.  Cardiovascular:     Rate and Rhythm: Normal rate and regular rhythm.  Pulmonary:     Effort: Pulmonary effort is normal. No respiratory distress.     Breath sounds: Normal breath sounds. No wheezing or rales.  Skin:    Comments: Feet reveal no skin lesions. Good distal foot pulses. Good capillary refill. No calluses. Normal sensation with monofilament testing   Neurological:     Mental Status: He is alert and oriented to person, place, and  time.        Assessment:     #1 type 2 diabetes.  He has had tremendous worsening of control over the past several months with A1c from 7.0 up to 10.3.  He relates poor compliance with diet.  He is still exercising regularly  #2 hypertension which is stable  #3 dyslipidemia treated with Lipitor    Plan:     -Check further labs today with basic metabolic panel, hepatic, lipid panel -Increase metformin to 500 mg 2 twice daily -Increase basal glargine to 30 units daily and titrate up 2 units every 3 days until fasting sugars  around 130 -He will tighten up diet with reducing sugars and starches and we have also offered diabetic education which he declines at this time  -Plan follow-up in 3 months and recheck A1c then  Eulas Post MD [facility] at [facility]

## 2018-10-15 NOTE — Patient Instructions (Signed)
Increase Metformin to two tablets twice daily  Increase the Basaglar to 30 units daily and increase by 2 units every 3 days until fasting sugars are around 130.    Tighten up diet as discussed.

## 2018-10-20 ENCOUNTER — Other Ambulatory Visit: Payer: Self-pay | Admitting: Family Medicine

## 2018-11-10 ENCOUNTER — Other Ambulatory Visit: Payer: Self-pay | Admitting: Family Medicine

## 2018-12-18 ENCOUNTER — Other Ambulatory Visit: Payer: Self-pay | Admitting: Family Medicine

## 2018-12-19 NOTE — Telephone Encounter (Signed)
Pt called about refill status for Insulin Glargine (BASAGLAR KWIKPEN) 100 UNIT/ML SOPN  / please advise

## 2018-12-19 NOTE — Telephone Encounter (Signed)
See request °

## 2019-01-03 ENCOUNTER — Other Ambulatory Visit: Payer: Self-pay | Admitting: Family Medicine

## 2019-01-08 ENCOUNTER — Other Ambulatory Visit: Payer: Self-pay | Admitting: Family Medicine

## 2019-01-12 ENCOUNTER — Other Ambulatory Visit: Payer: Self-pay | Admitting: Family Medicine

## 2019-01-16 ENCOUNTER — Ambulatory Visit: Payer: Federal, State, Local not specified - PPO | Admitting: Family Medicine

## 2019-01-16 ENCOUNTER — Encounter: Payer: Self-pay | Admitting: Family Medicine

## 2019-01-16 ENCOUNTER — Other Ambulatory Visit: Payer: Self-pay

## 2019-01-16 VITALS — BP 130/80 | HR 79 | Temp 97.7°F | Wt 229.1 lb

## 2019-01-16 DIAGNOSIS — Z794 Long term (current) use of insulin: Secondary | ICD-10-CM

## 2019-01-16 DIAGNOSIS — E785 Hyperlipidemia, unspecified: Secondary | ICD-10-CM

## 2019-01-16 DIAGNOSIS — I10 Essential (primary) hypertension: Secondary | ICD-10-CM

## 2019-01-16 DIAGNOSIS — E1165 Type 2 diabetes mellitus with hyperglycemia: Secondary | ICD-10-CM | POA: Diagnosis not present

## 2019-01-16 LAB — POCT GLYCOSYLATED HEMOGLOBIN (HGB A1C): Hemoglobin A1C: 5.8 % — AB (ref 4.0–5.6)

## 2019-01-16 MED ORDER — METFORMIN HCL 500 MG PO TABS: ORAL_TABLET | ORAL | 3 refills | Status: DC

## 2019-01-16 MED ORDER — ATORVASTATIN CALCIUM 20 MG PO TABS: 20.0000 mg | ORAL_TABLET | Freq: Every day | ORAL | 3 refills | Status: DC

## 2019-01-16 MED ORDER — LISINOPRIL 10 MG PO TABS: 10.0000 mg | ORAL_TABLET | Freq: Every day | ORAL | 3 refills | Status: DC

## 2019-01-16 MED ORDER — GABAPENTIN 100 MG PO CAPS: 200.0000 mg | ORAL_CAPSULE | Freq: Two times a day (BID) | ORAL | 3 refills | Status: DC

## 2019-01-16 NOTE — Progress Notes (Signed)
Subjective:     Patient ID: Carl Lam, male   DOB: [birth year], 73 y.o.   MRN: [id number]  HPI Patient is seen for medical follow-up  Type 2 diabetes.  Poor control with A1c 10.3% last visit.  We increased his metformin and also he increased his insulin currently up to 36 units daily.  He is scaled back sweets and sugars and blood sugars at home much improved.  In fact, he had several fasting blood sugars below 100.  Only one hypoglycemic episode.  He needs refills she states of all medications including lisinopril, metformin, Neurontin, and atorvastatin.  His lipids were well controlled recently.  He denies any dizziness or headaches.  Appetite and weight stable.  Past Medical History:  Diagnosis Date  . Acute lower GI bleeding 10/11/11  . DEGENERATIVE JOINT DISEASE 10/27/2008   Qualifier: Diagnosis of  By: Elease Hashimoto MD, Caroline Matters    . DIAB W/O COMP TYPE II/UNS NOT STATED UNCNTRL 10/27/2008   Qualifier: Diagnosis of  By: Valma Cava LPN, Izora Gala    . HYPERLIPIDEMIA 04/27/2009   Qualifier: Diagnosis of  By: Elease Hashimoto MD, Zavion Sleight    . HYPERTENSION 10/27/2008   Qualifier: Diagnosis of  By: Valma Cava LPN, Izora Gala     Past Surgical History:  Procedure Laterality Date  . COLONOSCOPY  10/12/2011   Procedure: COLONOSCOPY;  Surgeon: Lafayette Dragon, MD;  Location: [facility] ENDOSCOPY;  Service: Endoscopy;  Laterality: N/A;  . ESOPHAGOGASTRODUODENOSCOPY  10/12/2011   Procedure: ESOPHAGOGASTRODUODENOSCOPY (EGD);  Surgeon: Lafayette Dragon, MD;  Location: [facility] ENDOSCOPY;  Service: Endoscopy;  Laterality: N/A;  . HERNIA REPAIR  2007   "navel"  . JOINT REPLACEMENT    . KNEE CARTILAGE SURGERY  1960's and 1970's   left; "2 total"  . TOTAL HIP ARTHROPLASTY  2007    reports that he quit smoking about 40 years ago. His smoking use included cigarettes. He has a 4.50 pack-year smoking history. He has never used smokeless tobacco. He reports current alcohol use. He reports current drug use. Drug: Marijuana. family history  includes Alcohol abuse in an other family member; Arthritis in an other family member; Diabetes in an other family member. No Known Allergies   Review of Systems  Constitutional: Negative for chills, fatigue, fever and unexpected weight change.  Eyes: Negative for visual disturbance.  Respiratory: Negative for cough, chest tightness and shortness of breath.   Cardiovascular: Negative for chest pain, palpitations and leg swelling.  Endocrine: Negative for polydipsia and polyuria.  Neurological: Negative for dizziness, syncope, weakness, light-headedness and headaches.       Objective:   Physical Exam Constitutional:      Appearance: He is well-developed.  HENT:     Right Ear: External ear normal.     Left Ear: External ear normal.  Eyes:     Pupils: Pupils are equal, round, and reactive to light.  Neck:     Musculoskeletal: Neck supple.     Thyroid: No thyromegaly.  Cardiovascular:     Rate and Rhythm: Normal rate and regular rhythm.  Pulmonary:     Effort: Pulmonary effort is normal. No respiratory distress.     Breath sounds: Normal breath sounds. No wheezing or rales.  Neurological:     Mental Status: He is alert and oriented to person, place, and time.        Assessment:     #1 type 2 diabetes dramatically improved with A1c reduction from 10.3% to 5.8% today  #2 hypertension stable  #3 hyperlipidemia.  Recent lipids stable    Plan:     -Refilled several medications including metformin, lisinopril, gabapentin, atorvastatin -Reduce metformin back to 500 mg twice daily -We strongly recommended flu vaccine and Pneumovax he declines both -Continue regular eye checkups -Routine follow-up in 3 months and sooner as needed  Eulas Post MD [facility] at [facility]

## 2019-02-03 ENCOUNTER — Other Ambulatory Visit: Payer: Self-pay | Admitting: Family Medicine

## 2019-03-24 ENCOUNTER — Other Ambulatory Visit: Payer: Self-pay | Admitting: Family Medicine

## 2019-04-20 ENCOUNTER — Encounter: Payer: Self-pay | Admitting: Family Medicine

## 2019-04-20 ENCOUNTER — Ambulatory Visit: Payer: Federal, State, Local not specified - PPO | Admitting: Family Medicine

## 2019-04-20 ENCOUNTER — Other Ambulatory Visit: Payer: Self-pay

## 2019-04-20 VITALS — BP 142/84 | HR 76 | Temp 97.6°F | Ht 73.0 in | Wt 241.2 lb

## 2019-04-20 DIAGNOSIS — Z794 Long term (current) use of insulin: Secondary | ICD-10-CM

## 2019-04-20 DIAGNOSIS — E1165 Type 2 diabetes mellitus with hyperglycemia: Secondary | ICD-10-CM

## 2019-04-20 DIAGNOSIS — E785 Hyperlipidemia, unspecified: Secondary | ICD-10-CM

## 2019-04-20 DIAGNOSIS — I10 Essential (primary) hypertension: Secondary | ICD-10-CM | POA: Diagnosis not present

## 2019-04-20 LAB — POCT GLYCOSYLATED HEMOGLOBIN (HGB A1C): Hemoglobin A1C: 7.5 % — AB (ref 4.0–5.6)

## 2019-04-20 MED ORDER — METFORMIN HCL 500 MG PO TABS: ORAL_TABLET | ORAL | 3 refills | Status: DC

## 2019-04-20 NOTE — Patient Instructions (Signed)

## 2019-04-20 NOTE — Progress Notes (Signed)
Subjective:     Patient ID: Carl Lam, male   DOB: 12-Jul-1945, 74 y.o.   MRN: [id number]  HPI [name] is seen for medical follow-up.  He has type 2 diabetes, obesity, hyperlipidemia, hypertension.  His weight is up about 12 to 13 pounds from last visit but he is wearing boots and jeans and he thinks some of this is related to clothing.  Poor compliance with diet since follow-up.  He remains on Basaglar 30 units daily and Metformin 500 mg twice daily.  No recent hypoglycemia.  His A1c had reduced greatly from 10.3 to 5.8 last visit.  His blood pressure is up some today.  He remains compliant with lisinopril 10 mg daily.  No recent headaches or chest pains.  He remains on Lipitor for hyperlipidemia.  His lipids were checked back over the summer and stable.  Past Medical History:  Diagnosis Date  . Acute lower GI bleeding 10/11/11  . DEGENERATIVE JOINT DISEASE 10/27/2008   Qualifier: Diagnosis of  By: Elease Hashimoto MD, Alixander Rallis    . DIAB W/O COMP TYPE II/UNS NOT STATED UNCNTRL 10/27/2008   Qualifier: Diagnosis of  By: Valma Cava LPN, Izora Gala    . HYPERLIPIDEMIA 04/27/2009   Qualifier: Diagnosis of  By: Elease Hashimoto MD, Wanna Gully    . HYPERTENSION 10/27/2008   Qualifier: Diagnosis of  By: Valma Cava LPN, Izora Gala     Past Surgical History:  Procedure Laterality Date  . COLONOSCOPY  10/12/2011   Procedure: COLONOSCOPY;  Surgeon: Lafayette Dragon, MD;  Location: [facility] ENDOSCOPY;  Service: Endoscopy;  Laterality: N/A;  . ESOPHAGOGASTRODUODENOSCOPY  10/12/2011   Procedure: ESOPHAGOGASTRODUODENOSCOPY (EGD);  Surgeon: Lafayette Dragon, MD;  Location: [facility] ENDOSCOPY;  Service: Endoscopy;  Laterality: N/A;  . HERNIA REPAIR  2007   "navel"  . JOINT REPLACEMENT    . KNEE CARTILAGE SURGERY  1960's and 1970's   left; "2 total"  . TOTAL HIP ARTHROPLASTY  2007    reports that he quit smoking about 40 years ago. His smoking use included cigarettes. He has a 4.50 pack-year smoking history. He has never used smokeless tobacco. He  reports current alcohol use. He reports current drug use. Drug: Marijuana. family history includes Alcohol abuse in an other family member; Arthritis in an other family member; Diabetes in an other family member. No Known Allergies   Review of Systems  Constitutional: Negative for fatigue.  Eyes: Negative for visual disturbance.  Respiratory: Negative for cough, chest tightness and shortness of breath.   Cardiovascular: Negative for chest pain, palpitations and leg swelling.  Endocrine: Negative for polydipsia and polyuria.  Neurological: Negative for dizziness, syncope, weakness, light-headedness and headaches.       Objective:   Physical Exam Constitutional:      Appearance: He is well-developed.  HENT:     Right Ear: External ear normal.     Left Ear: External ear normal.  Eyes:     Pupils: Pupils are equal, round, and reactive to light.  Neck:     Thyroid: No thyromegaly.  Cardiovascular:     Rate and Rhythm: Normal rate and regular rhythm.  Pulmonary:     Effort: Pulmonary effort is normal. No respiratory distress.     Breath sounds: Normal breath sounds. No wheezing or rales.  Musculoskeletal:     Cervical back: Neck supple.     Right lower leg: No edema.     Left lower leg: No edema.  Neurological:     Mental Status: He is alert and  oriented to person, place, and time.        Assessment:     #1 type 2 diabetes worsening with A1c today 7.5%  #2 hypertension has been generally well controlled but up some today  #3 hyperlipidemia treated with Lipitor and stable    Plan:     -Increase Metformin 500 mg to 2 twice daily -Tighten up diet and recommend 102-month follow-up and recheck A1c -We also gave him option of slow titration of Basaglar 2 units every 3 days until fasting blood sugars consistently less than 130 -We discussed possible titration of lisinopril but he would like to give 64-month trial of weight loss and reassessing before changing medication  Eulas Post MD [facility]

## 2019-05-19 ENCOUNTER — Other Ambulatory Visit: Payer: Self-pay | Admitting: Family Medicine

## 2019-07-11 ENCOUNTER — Other Ambulatory Visit: Payer: Self-pay | Admitting: Family Medicine

## 2019-07-20 ENCOUNTER — Encounter: Payer: Self-pay | Admitting: Family Medicine

## 2019-07-20 ENCOUNTER — Other Ambulatory Visit: Payer: Self-pay

## 2019-07-20 ENCOUNTER — Ambulatory Visit: Payer: Federal, State, Local not specified - PPO | Admitting: Family Medicine

## 2019-07-20 VITALS — BP 136/82 | HR 66 | Temp 97.7°F | Wt 237.3 lb

## 2019-07-20 DIAGNOSIS — E785 Hyperlipidemia, unspecified: Secondary | ICD-10-CM

## 2019-07-20 DIAGNOSIS — Z794 Long term (current) use of insulin: Secondary | ICD-10-CM | POA: Diagnosis not present

## 2019-07-20 DIAGNOSIS — I10 Essential (primary) hypertension: Secondary | ICD-10-CM

## 2019-07-20 DIAGNOSIS — E1165 Type 2 diabetes mellitus with hyperglycemia: Secondary | ICD-10-CM | POA: Diagnosis not present

## 2019-07-20 LAB — POCT GLYCOSYLATED HEMOGLOBIN (HGB A1C): Hemoglobin A1C: 6.2 % — AB (ref 4.0–5.6)

## 2019-07-20 NOTE — Patient Instructions (Signed)
Your A1C is greatly improved at 6.2%  Keep up the exercise and weight loss.    Let's plan on 3 month follow up.

## 2019-07-20 NOTE — Progress Notes (Signed)
Subjective:     Patient ID: Carl Lam, male   DOB: 1945/10/14, 74 y.o.   MRN: [id number]  HPI Carl Lam is here for medical follow-up.  He has chronic problems including type 2 diabetes, hypertension, dyslipidemia, history of diverticulosis bleed.  Denies any recent stool changes.  Not had any evidence for diverticular bleed several years now.  His last A1c was poorly controlled at 7.5%.  We increased his Metformin.  He has not had any side effects from that.  His weight is down 4 pounds.  He has tried to be more strict with carbohydrate intake since then.  He also has acquired a treadmill as well as exercise bike and some resistance training tools and is using those regularly.  Exercises at least 5 days/week  He had recent fasting blood sugar 71.  No recent hypoglycemia.  Is on combination therapy with Basaglar insulin and Metformin.  He is on Lipitor for hyperlipidemia.  No myalgias.  Is on lisinopril for hypertension.  Not monitoring blood pressures at home.  No recent headaches or dizziness.  Compliant with all medications.  Past Medical History:  Diagnosis Date  . Acute lower GI bleeding 10/11/11  . DEGENERATIVE JOINT DISEASE 10/27/2008   Qualifier: Diagnosis of  By: Elease Hashimoto MD, Nainika Newlun    . DIAB W/O COMP TYPE II/UNS NOT STATED UNCNTRL 10/27/2008   Qualifier: Diagnosis of  By: Valma Cava LPN, Izora Gala    . HYPERLIPIDEMIA 04/27/2009   Qualifier: Diagnosis of  By: Elease Hashimoto MD, Geovanni Rahming    . HYPERTENSION 10/27/2008   Qualifier: Diagnosis of  By: Valma Cava LPN, Izora Gala     Past Surgical History:  Procedure Laterality Date  . COLONOSCOPY  10/12/2011   Procedure: COLONOSCOPY;  Surgeon: Lafayette Dragon, MD;  Location: [facility] ENDOSCOPY;  Service: Endoscopy;  Laterality: N/A;  . ESOPHAGOGASTRODUODENOSCOPY  10/12/2011   Procedure: ESOPHAGOGASTRODUODENOSCOPY (EGD);  Surgeon: Lafayette Dragon, MD;  Location: [facility] ENDOSCOPY;  Service: Endoscopy;  Laterality: N/A;  . HERNIA REPAIR  2007   "navel"  . JOINT  REPLACEMENT    . KNEE CARTILAGE SURGERY  1960's and 1970's   left; "2 total"  . TOTAL HIP ARTHROPLASTY  2007    reports that he quit smoking about 40 years ago. His smoking use included cigarettes. He has a 4.50 pack-year smoking history. He has never used smokeless tobacco. He reports current alcohol use. He reports current drug use. Drug: Marijuana. family history includes Alcohol abuse in an other family member; Arthritis in an other family member; Diabetes in an other family member. No Known Allergies  Review of Systems  Constitutional: Negative for appetite change, fatigue and unexpected weight change.  Eyes: Negative for visual disturbance.  Respiratory: Negative for cough, chest tightness and shortness of breath.   Cardiovascular: Negative for chest pain, palpitations and leg swelling.  Endocrine: Negative for polydipsia and polyuria.  Genitourinary: Negative for difficulty urinating and dysuria.  Neurological: Negative for dizziness, syncope, weakness, light-headedness and headaches.       Objective:   Physical Exam Constitutional:      Appearance: He is well-developed.  HENT:     Right Ear: External ear normal.     Left Ear: External ear normal.  Eyes:     Pupils: Pupils are equal, round, and reactive to light.  Neck:     Thyroid: No thyromegaly.  Cardiovascular:     Rate and Rhythm: Normal rate and regular rhythm.  Pulmonary:     Effort: Pulmonary effort is normal. No  respiratory distress.     Breath sounds: Normal breath sounds. No wheezing or rales.  Musculoskeletal:     Cervical back: Neck supple.     Right lower leg: No edema.     Left lower leg: No edema.  Neurological:     Mental Status: He is alert and oriented to person, place, and time.        Assessment:     #1 type 2 diabetes improved controlled with A1c 6.2%  #2 hypertension.  Borderline control currently treated with lisinopril  #3 hyperlipidemia treated with Lipitor with goal LDL less than  70    Plan:     -Continue weight loss efforts continue regular exercise  -We did not make any medication changes today.  We will plan on 3 or 90-month follow-up and will need lipids at that point along with chemistries  -Patient had question regarding whether he could taper off meds and we have not recommended tapering at this point but continue current regimen.  Of A1c even further down at next visit would consider slow tapering back of Basaglar insulin  Eulas Post MD [facility] at [facility]

## 2019-09-01 ENCOUNTER — Other Ambulatory Visit: Payer: Self-pay | Admitting: Family Medicine

## 2019-10-25 ENCOUNTER — Other Ambulatory Visit: Payer: Self-pay | Admitting: Family Medicine

## 2019-11-20 ENCOUNTER — Other Ambulatory Visit: Payer: Self-pay

## 2019-11-20 ENCOUNTER — Ambulatory Visit: Payer: Federal, State, Local not specified - PPO | Admitting: Family Medicine

## 2019-11-20 ENCOUNTER — Encounter: Payer: Self-pay | Admitting: Family Medicine

## 2019-11-20 VITALS — BP 140/82 | HR 75 | Temp 97.8°F | Wt 237.3 lb

## 2019-11-20 DIAGNOSIS — Z794 Long term (current) use of insulin: Secondary | ICD-10-CM

## 2019-11-20 DIAGNOSIS — E1165 Type 2 diabetes mellitus with hyperglycemia: Secondary | ICD-10-CM

## 2019-11-20 DIAGNOSIS — I10 Essential (primary) hypertension: Secondary | ICD-10-CM

## 2019-11-20 DIAGNOSIS — E785 Hyperlipidemia, unspecified: Secondary | ICD-10-CM | POA: Diagnosis not present

## 2019-11-20 LAB — POCT GLYCOSYLATED HEMOGLOBIN (HGB A1C): Hemoglobin A1C: 6 % — AB (ref 4.0–5.6)

## 2019-11-20 MED ORDER — LISINOPRIL 20 MG PO TABS: 20.0000 mg | ORAL_TABLET | Freq: Every day | ORAL | 3 refills | Status: DC

## 2019-11-20 NOTE — Progress Notes (Signed)
Established Patient Office Visit  Subjective:  Patient ID: Carl Lam, male    DOB: [birth year]  Age: 74 y.o. MRN: [id number]  CC:  Chief Complaint  Patient presents with  . Follow-up    pt is here for 4 month follow up on diabetes     HPI [name] presents for medical follow-up.  He has type 2 diabetes, hyperlipidemia, hypertension.  He is currently on Metformin 1000 mg twice daily and Basaglar 30 units once daily.  No hypoglycemia.  Fasting blood sugars usually around 120.  He is walking up to 6 miles per day.  Generally feels well.  No recent dizziness or chest pains.  He is on lisinopril 10 mg daily for hypertension.  No side effects.  Takes atorvastatin for hyperlipidemia.  He is due for from follow-up labs.  Appetite and weight have been stable.  He is overdue for eye exam  Past Medical History:  Diagnosis Date  . Acute lower GI bleeding 10/11/11  . DEGENERATIVE JOINT DISEASE 10/27/2008   Qualifier: Diagnosis of  By: Elease Hashimoto MD, Daniele Yankowski    . DIAB W/O COMP TYPE II/UNS NOT STATED UNCNTRL 10/27/2008   Qualifier: Diagnosis of  By: Valma Cava LPN, Izora Gala    . HYPERLIPIDEMIA 04/27/2009   Qualifier: Diagnosis of  By: Elease Hashimoto MD, Jakyiah Briones    . HYPERTENSION 10/27/2008   Qualifier: Diagnosis of  By: Valma Cava LPN, Izora Gala      Past Surgical History:  Procedure Laterality Date  . COLONOSCOPY  10/12/2011   Procedure: COLONOSCOPY;  Surgeon: Lafayette Dragon, MD;  Location: [facility] ENDOSCOPY;  Service: Endoscopy;  Laterality: N/A;  . ESOPHAGOGASTRODUODENOSCOPY  10/12/2011   Procedure: ESOPHAGOGASTRODUODENOSCOPY (EGD);  Surgeon: Lafayette Dragon, MD;  Location: [facility] ENDOSCOPY;  Service: Endoscopy;  Laterality: N/A;  . HERNIA REPAIR  2007   "navel"  . JOINT REPLACEMENT    . KNEE CARTILAGE SURGERY  1960's and 1970's   left; "2 total"  . TOTAL HIP ARTHROPLASTY  2007    Family History  Problem Relation Age of Onset  . Alcohol abuse Other        grandparent  . Arthritis Other        arthritis    . Diabetes Other        grandparent    Social History   Socioeconomic History  . Marital status: Married    Spouse name: Not on file  . Number of children: Not on file  . Years of education: Not on file  . Highest education level: Not on file  Occupational History  . Not on file  Tobacco Use  . Smoking status: Former Smoker    Packs/day: 0.30    Years: 15.00    Pack years: 4.50    Types: Cigarettes    Quit date: 10/11/1978    Years since quitting: 41.1  . Smokeless tobacco: Never Used  Vaping Use  . Vaping Use: Never used  Substance and Sexual Activity  . Alcohol use: Yes    Comment: 10/11/11 "last alcohol 27-28 years ago"  . Drug use: Yes    Types: Marijuana    Comment: "recreational marijuana in [employer]; 1970's"  . Sexual activity: Not Currently  Other Topics Concern  . Not on file  Social History Narrative  . Not on file   Social Determinants of Health   Financial Resource Strain:   . Difficulty of Paying Living Expenses:   Food Insecurity:   . Worried About Charity fundraiser in  the Last Year:   . [place] in the Last Year:   Transportation Needs:   . Film/video editor (Medical):   Marland Kitchen Lack of Transportation (Non-Medical):   Physical Activity:   . Days of Exercise per Week:   . Minutes of Exercise per Session:   Stress:   . Feeling of Stress :   Social Connections:   . Frequency of Communication with Friends and Family:   . Frequency of Social Gatherings with Friends and Family:   . Attends Religious Services:   . Active Member of Clubs or Organizations:   . Attends Archivist Meetings:   Marland Kitchen Marital Status:   Intimate Partner Violence:   . Fear of Current or Ex-Partner:   . Emotionally Abused:   Marland Kitchen Physically Abused:   . Sexually Abused:     Outpatient Medications Prior to Visit  Medication Sig Dispense Refill  . atorvastatin (LIPITOR) 20 MG tablet Take 1 tablet (20 mg total) by mouth daily. 90 tablet 3  . gabapentin  (NEURONTIN) 100 MG capsule Take 2 capsules (200 mg total) by mouth 2 (two) times daily. 360 capsule 3  . Insulin Glargine (BASAGLAR KWIKPEN) 100 UNIT/ML INJECT 30 UNITS SUBCUTANEOUSLY AT BEDTIME (Patient taking differently: Inject 25 Units into the skin daily. ) 15 mL 0  . metFORMIN (GLUCOPHAGE) 500 MG tablet Take two tablets twice daily. (Patient taking differently: 1,000 mg. Take two tablets twice daily.) 360 tablet 3  . lisinopril (ZESTRIL) 10 MG tablet Take 1 tablet (10 mg total) by mouth daily. 90 tablet 3   No facility-administered medications prior to visit.    No Known Allergies  ROS Review of Systems  Constitutional: Negative for fatigue.  Eyes: Negative for visual disturbance.  Respiratory: Negative for cough, chest tightness and shortness of breath.   Cardiovascular: Negative for chest pain, palpitations and leg swelling.  Endocrine: Negative for polydipsia and polyuria.  Neurological: Negative for dizziness, syncope, weakness, light-headedness and headaches.      Objective:    Physical Exam Constitutional:      Appearance: He is well-developed.  HENT:     Right Ear: External ear normal.     Left Ear: External ear normal.  Eyes:     Pupils: Pupils are equal, round, and reactive to light.  Neck:     Thyroid: No thyromegaly.  Cardiovascular:     Rate and Rhythm: Normal rate and regular rhythm.  Pulmonary:     Effort: Pulmonary effort is normal. No respiratory distress.     Breath sounds: Normal breath sounds. No wheezing or rales.  Musculoskeletal:     Cervical back: Neck supple.     Right lower leg: No edema.     Left lower leg: No edema.  Skin:    Comments: Feet reveal no lesions.  He has good distal foot pulses.  No significant calluses.  No major impairment with monofilament testing  Neurological:     Mental Status: He is alert and oriented to person, place, and time.     BP 140/82 (BP Location: Left Arm, Patient Position: Sitting, Cuff Size: Normal)    Pulse 75   Temp 97.8 F (36.6 C) (Oral)   Wt 237 lb 4.8 oz (107.6 kg)   SpO2 97%   BMI 31.31 kg/m  Wt Readings from Last 3 Encounters:  11/20/19 237 lb 4.8 oz (107.6 kg)  07/20/19 237 lb 4.8 oz (107.6 kg)  04/20/19 241 lb 3.2 oz (109.4 kg)  Health Maintenance Due  Topic Date Due  . COVID-19 Vaccine (1) Never done  . TETANUS/TDAP  Never done    There are no preventive care reminders to display for this patient.  Lab Results  Component Value Date   TSH 2.66 07/15/2017   Lab Results  Component Value Date   WBC 4.9 07/15/2017   HGB 15.0 07/15/2017   HCT 44.2 07/15/2017   MCV 90.9 07/15/2017   PLT 279.0 07/15/2017   Lab Results  Component Value Date   NA 139 10/15/2018   K 4.4 10/15/2018   CO2 28 10/15/2018   GLUCOSE 193 (H) 10/15/2018   BUN 15 10/15/2018   CREATININE 1.31 10/15/2018   BILITOT 0.8 10/15/2018   ALKPHOS 70 10/15/2018   AST 10 10/15/2018   ALT 10 10/15/2018   PROT 7.2 10/15/2018   ALBUMIN 4.3 10/15/2018   CALCIUM 9.2 10/15/2018   GFR 65.02 10/15/2018   Lab Results  Component Value Date   CHOL 145 10/15/2018   Lab Results  Component Value Date   HDL 52.00 10/15/2018   Lab Results  Component Value Date   LDLCALC 79 10/15/2018   Lab Results  Component Value Date   TRIG 72.0 10/15/2018   Lab Results  Component Value Date   CHOLHDL 3 10/15/2018   Lab Results  Component Value Date   HGBA1C 6.0 (A) 11/20/2019      Assessment & Plan:   Problem List Items Addressed This Visit      Unprioritized   Type 2 diabetes mellitus with hyperglycemia ([place]) - Primary   Relevant Medications   lisinopril (ZESTRIL) 20 MG tablet   Other Relevant Orders   POCT glycosylated hemoglobin (Hb A1C) (Completed)   Essential hypertension   Relevant Medications   lisinopril (ZESTRIL) 20 MG tablet   Other Relevant Orders   Basic metabolic panel   Hyperlipidemia   Relevant Medications   lisinopril (ZESTRIL) 20 MG tablet   Other Relevant Orders    Lipid panel   Hepatic function panel    His blood pressure is suboptimally controlled.  Repeat reading left arm at rest 140/94.  Will increase lisinopril 20 mg daily.  New prescription for lisinopril 20 mg sent in.    A1c well controlled at 6.0%.  He will try reducing his Basaglar to 25 units.  Recommend 7-month follow-up  Meds ordered this encounter  Medications  . lisinopril (ZESTRIL) 20 MG tablet    Sig: Take 1 tablet (20 mg total) by mouth daily.    Dispense:  90 tablet    Refill:  3    Follow-up: Return in about 3 months (around 02/20/2020).    Carolann Littler, MD

## 2019-11-20 NOTE — Patient Instructions (Signed)
Increase the Lisinopril to 20 mg daily  Decrease the Basaglar to 25 units once daily

## 2019-11-20 NOTE — Addendum Note (Signed)
Addended by: Marrion Coy on: 11/20/2019 07:49 AM   Modules accepted: Orders

## 2019-11-21 LAB — LIPID PANEL
Cholesterol: 140 mg/dL (ref <200)
HDL: 49 mg/dL (ref >=40)
LDL Cholesterol (Calc): 77 mg/dL (calc)
Non-HDL Cholesterol (Calc): 91 mg/dL (calc) (ref <130)
Total CHOL/HDL Ratio: 2.9 (calc) (ref <5.0)
Triglycerides: 68 mg/dL (ref <150)

## 2019-11-21 LAB — BASIC METABOLIC PANEL
BUN/Creatinine Ratio: 15 (calc) (ref 6–22)
BUN: 22 mg/dL (ref 7–25)
CO2: 26 mmol/L (ref 20–32)
Calcium: 9.3 mg/dL (ref 8.6–10.3)
Chloride: 107 mmol/L (ref 98–110)
Creat: 1.44 mg/dL — ABNORMAL HIGH (ref 0.70–1.18)
Glucose, Bld: 96 mg/dL (ref 65–99)
Potassium: 4.6 mmol/L (ref 3.5–5.3)
Sodium: 142 mmol/L (ref 135–146)

## 2019-11-21 LAB — HEPATIC FUNCTION PANEL
AG Ratio: 1.4 (calc) (ref 1.0–2.5)
ALT: 10 U/L (ref 9–46)
AST: 12 U/L (ref 10–35)
Albumin: 4.2 g/dL (ref 3.6–5.1)
Alkaline phosphatase (APISO): 59 U/L (ref 35–144)
Bilirubin, Direct: 0.1 mg/dL (ref 0.0–0.2)
Globulin: 3 g/dL (calc) (ref 1.9–3.7)
Indirect Bilirubin: 0.5 mg/dL (calc) (ref 0.2–1.2)
Total Bilirubin: 0.6 mg/dL (ref 0.2–1.2)
Total Protein: 7.2 g/dL (ref 6.1–8.1)

## 2019-12-14 ENCOUNTER — Inpatient Hospital Stay (HOSPITAL_COMMUNITY)
Admission: EM | Admit: 2019-12-14 | Discharge: 2019-12-17 | DRG: 064 | Disposition: A | Discharge status: Home Health | Payer: Medicare Other | Attending: Family Medicine | Admitting: Family Medicine

## 2019-12-14 ENCOUNTER — Emergency Department (HOSPITAL_COMMUNITY): Payer: Medicare Other

## 2019-12-14 ENCOUNTER — Encounter (HOSPITAL_COMMUNITY): Payer: Self-pay | Admitting: Internal Medicine

## 2019-12-14 ENCOUNTER — Inpatient Hospital Stay (HOSPITAL_COMMUNITY): Payer: Medicare Other

## 2019-12-14 ENCOUNTER — Other Ambulatory Visit: Payer: Self-pay

## 2019-12-14 ENCOUNTER — Encounter: Payer: Self-pay | Admitting: Family Medicine

## 2019-12-14 ENCOUNTER — Ambulatory Visit: Payer: Federal, State, Local not specified - PPO | Admitting: Family Medicine

## 2019-12-14 VITALS — BP 150/98 | HR 86 | Temp 98.3°F | Ht 73.0 in | Wt 236.0 lb

## 2019-12-14 DIAGNOSIS — K621 Rectal polyp: Secondary | ICD-10-CM | POA: Diagnosis not present

## 2019-12-14 DIAGNOSIS — K449 Diaphragmatic hernia without obstruction or gangrene: Secondary | ICD-10-CM | POA: Diagnosis present

## 2019-12-14 DIAGNOSIS — R4701 Aphasia: Secondary | ICD-10-CM | POA: Diagnosis present

## 2019-12-14 DIAGNOSIS — E1151 Type 2 diabetes mellitus with diabetic peripheral angiopathy without gangrene: Secondary | ICD-10-CM | POA: Diagnosis not present

## 2019-12-14 DIAGNOSIS — K5731 Diverticulosis of large intestine without perforation or abscess with bleeding: Secondary | ICD-10-CM | POA: Diagnosis present

## 2019-12-14 DIAGNOSIS — I6529 Occlusion and stenosis of unspecified carotid artery: Secondary | ICD-10-CM | POA: Diagnosis present

## 2019-12-14 DIAGNOSIS — E1165 Type 2 diabetes mellitus with hyperglycemia: Secondary | ICD-10-CM

## 2019-12-14 DIAGNOSIS — Z833 Family history of diabetes mellitus: Secondary | ICD-10-CM

## 2019-12-14 DIAGNOSIS — Z79899 Other long term (current) drug therapy: Secondary | ICD-10-CM | POA: Diagnosis not present

## 2019-12-14 DIAGNOSIS — D62 Acute posthemorrhagic anemia: Secondary | ICD-10-CM | POA: Diagnosis present

## 2019-12-14 DIAGNOSIS — E785 Hyperlipidemia, unspecified: Secondary | ICD-10-CM | POA: Diagnosis present

## 2019-12-14 DIAGNOSIS — Z7984 Long term (current) use of oral hypoglycemic drugs: Secondary | ICD-10-CM | POA: Diagnosis not present

## 2019-12-14 DIAGNOSIS — R195 Other fecal abnormalities: Secondary | ICD-10-CM | POA: Diagnosis not present

## 2019-12-14 DIAGNOSIS — G8191 Hemiplegia, unspecified affecting right dominant side: Secondary | ICD-10-CM | POA: Diagnosis present

## 2019-12-14 DIAGNOSIS — E1122 Type 2 diabetes mellitus with diabetic chronic kidney disease: Secondary | ICD-10-CM | POA: Diagnosis present

## 2019-12-14 DIAGNOSIS — N182 Chronic kidney disease, stage 2 (mild): Secondary | ICD-10-CM | POA: Diagnosis present

## 2019-12-14 DIAGNOSIS — I129 Hypertensive chronic kidney disease with stage 1 through stage 4 chronic kidney disease, or unspecified chronic kidney disease: Secondary | ICD-10-CM | POA: Diagnosis present

## 2019-12-14 DIAGNOSIS — R270 Ataxia, unspecified: Secondary | ICD-10-CM | POA: Diagnosis present

## 2019-12-14 DIAGNOSIS — R269 Unspecified abnormalities of gait and mobility: Secondary | ICD-10-CM | POA: Diagnosis not present

## 2019-12-14 DIAGNOSIS — Z811 Family history of alcohol abuse and dependence: Secondary | ICD-10-CM

## 2019-12-14 DIAGNOSIS — Z8261 Family history of arthritis: Secondary | ICD-10-CM | POA: Diagnosis not present

## 2019-12-14 DIAGNOSIS — I639 Cerebral infarction, unspecified: Secondary | ICD-10-CM

## 2019-12-14 DIAGNOSIS — I10 Essential (primary) hypertension: Secondary | ICD-10-CM

## 2019-12-14 DIAGNOSIS — K640 First degree hemorrhoids: Secondary | ICD-10-CM | POA: Diagnosis present

## 2019-12-14 DIAGNOSIS — Z87891 Personal history of nicotine dependence: Secondary | ICD-10-CM | POA: Diagnosis not present

## 2019-12-14 DIAGNOSIS — N179 Acute kidney failure, unspecified: Secondary | ICD-10-CM | POA: Diagnosis present

## 2019-12-14 DIAGNOSIS — K922 Gastrointestinal hemorrhage, unspecified: Secondary | ICD-10-CM | POA: Diagnosis not present

## 2019-12-14 DIAGNOSIS — R531 Weakness: Secondary | ICD-10-CM | POA: Diagnosis present

## 2019-12-14 DIAGNOSIS — R42 Dizziness and giddiness: Secondary | ICD-10-CM

## 2019-12-14 DIAGNOSIS — Z20822 Contact with and (suspected) exposure to covid-19: Secondary | ICD-10-CM | POA: Diagnosis present

## 2019-12-14 DIAGNOSIS — R4789 Other speech disturbances: Secondary | ICD-10-CM

## 2019-12-14 DIAGNOSIS — Z794 Long term (current) use of insulin: Secondary | ICD-10-CM

## 2019-12-14 DIAGNOSIS — D124 Benign neoplasm of descending colon: Secondary | ICD-10-CM

## 2019-12-14 DIAGNOSIS — Z96649 Presence of unspecified artificial hip joint: Secondary | ICD-10-CM | POA: Diagnosis present

## 2019-12-14 DIAGNOSIS — D649 Anemia, unspecified: Secondary | ICD-10-CM

## 2019-12-14 DIAGNOSIS — K635 Polyp of colon: Secondary | ICD-10-CM | POA: Diagnosis not present

## 2019-12-14 DIAGNOSIS — D128 Benign neoplasm of rectum: Secondary | ICD-10-CM

## 2019-12-14 DIAGNOSIS — I6389 Other cerebral infarction: Secondary | ICD-10-CM | POA: Diagnosis not present

## 2019-12-14 HISTORY — DX: Cerebral infarction, unspecified: I63.9

## 2019-12-14 LAB — DIFFERENTIAL
Abs Immature Granulocytes: 0.04 10*3/uL (ref 0.00–0.07)
Basophils Absolute: 0 10*3/uL (ref 0.0–0.1)
Basophils Relative: 1 %
Eosinophils Absolute: 0.1 10*3/uL (ref 0.0–0.5)
Eosinophils Relative: 2 %
Immature Granulocytes: 1 %
Lymphocytes Relative: 26 %
Lymphs Abs: 1.5 10*3/uL (ref 0.7–4.0)
Monocytes Absolute: 0.4 10*3/uL (ref 0.1–1.0)
Monocytes Relative: 8 %
Neutro Abs: 3.7 10*3/uL (ref 1.7–7.7)
Neutrophils Relative %: 62 %

## 2019-12-14 LAB — CBC
HCT: 27 % — ABNORMAL LOW (ref 39.0–52.0)
Hemoglobin: 8.7 g/dL — ABNORMAL LOW (ref 13.0–17.0)
MCH: 30.3 pg (ref 26.0–34.0)
MCHC: 32.2 g/dL (ref 30.0–36.0)
MCV: 94.1 fL (ref 80.0–100.0)
Platelets: 243 10*3/uL (ref 150–400)
RBC: 2.87 MIL/uL — ABNORMAL LOW (ref 4.22–5.81)
RDW: 15.3 % (ref 11.5–15.5)
WBC: 5.9 10*3/uL (ref 4.0–10.5)
nRBC: 0 % (ref 0.0–0.2)

## 2019-12-14 LAB — I-STAT CHEM 8, ED
BUN: 16 mg/dL (ref 8–23)
Calcium, Ion: 1.24 mmol/L (ref 1.15–1.40)
Chloride: 107 mmol/L (ref 98–111)
Creatinine, Ser: 1.8 mg/dL — ABNORMAL HIGH (ref 0.61–1.24)
Glucose, Bld: 182 mg/dL — ABNORMAL HIGH (ref 70–99)
HCT: 26 % — ABNORMAL LOW (ref 39.0–52.0)
Hemoglobin: 8.8 g/dL — ABNORMAL LOW (ref 13.0–17.0)
Potassium: 4.4 mmol/L (ref 3.5–5.1)
Sodium: 144 mmol/L (ref 135–145)
TCO2: 26 mmol/L (ref 22–32)

## 2019-12-14 LAB — COMPREHENSIVE METABOLIC PANEL
ALT: 11 U/L (ref 0–44)
AST: 13 U/L — ABNORMAL LOW (ref 15–41)
Albumin: 3.8 g/dL (ref 3.5–5.0)
Alkaline Phosphatase: 43 U/L (ref 38–126)
Anion gap: 6 (ref 5–15)
BUN: 14 mg/dL (ref 8–23)
CO2: 26 mmol/L (ref 22–32)
Calcium: 9.2 mg/dL (ref 8.9–10.3)
Chloride: 109 mmol/L (ref 98–111)
Creatinine, Ser: 1.78 mg/dL — ABNORMAL HIGH (ref 0.61–1.24)
GFR calc Af Amer: 43 mL/min — ABNORMAL LOW (ref >60)
GFR calc non Af Amer: 37 mL/min — ABNORMAL LOW (ref >60)
Glucose, Bld: 196 mg/dL — ABNORMAL HIGH (ref 70–99)
Potassium: 4.4 mmol/L (ref 3.5–5.1)
Sodium: 141 mmol/L (ref 135–145)
Total Bilirubin: 0.3 mg/dL (ref 0.3–1.2)
Total Protein: 6.8 g/dL (ref 6.5–8.1)

## 2019-12-14 LAB — PROTIME-INR
INR: 1 (ref 0.8–1.2)
Prothrombin Time: 12.8 seconds (ref 11.4–15.2)

## 2019-12-14 LAB — POC OCCULT BLOOD, ED: Fecal Occult Bld: POSITIVE — AB

## 2019-12-14 LAB — APTT: aPTT: 25 seconds (ref 24–36)

## 2019-12-14 MED ORDER — INSULIN ASPART 100 UNIT/ML ~~LOC~~ SOLN
0.0000 [IU] | SUBCUTANEOUS | Status: DC
  Administered 2019-12-15 – 2019-12-16 (×2): 1 [IU] via SUBCUTANEOUS
  Administered 2019-12-16: 3 [IU] via SUBCUTANEOUS
  Administered 2019-12-17 (×2): 1 [IU] via SUBCUTANEOUS
  Administered 2019-12-17 (×2): 2 [IU] via SUBCUTANEOUS

## 2019-12-14 MED ORDER — STROKE: EARLY STAGES OF RECOVERY BOOK
Freq: Once | Status: AC
  Filled 2019-12-14: qty 1

## 2019-12-14 MED ORDER — ACETAMINOPHEN 160 MG/5ML PO SOLN: 650.0000 mg | ORAL | Status: DC | PRN

## 2019-12-14 MED ORDER — ACETAMINOPHEN 325 MG PO TABS: 650.0000 mg | ORAL_TABLET | ORAL | Status: DC | PRN

## 2019-12-14 MED ORDER — INSULIN GLARGINE 100 UNIT/ML ~~LOC~~ SOLN
10.0000 [IU] | Freq: Every day | SUBCUTANEOUS | Status: DC
  Administered 2019-12-15 – 2019-12-16 (×2): 10 [IU] via SUBCUTANEOUS
  Filled 2019-12-14 (×5): qty 0.1

## 2019-12-14 MED ORDER — ACETAMINOPHEN 650 MG RE SUPP: 650.0000 mg | RECTAL | Status: DC | PRN

## 2019-12-14 MED ORDER — SODIUM CHLORIDE 0.9% FLUSH: 3.0000 mL | Freq: Once | INTRAVENOUS | Status: DC

## 2019-12-14 MED ORDER — SODIUM CHLORIDE 0.9 % IV BOLUS
500.0000 mL | Freq: Once | INTRAVENOUS | Status: AC
  Administered 2019-12-14: 500 mL via INTRAVENOUS

## 2019-12-14 MED ORDER — SODIUM CHLORIDE 0.9 % IV SOLN: INTRAVENOUS | Status: DC

## 2019-12-14 NOTE — Progress Notes (Signed)
Established Patient Office Visit  Subjective:  Patient ID: Carl Lam, male    DOB: Apr 21, 1945  Age: 74 y.o. MRN: [id number]  CC:  Chief Complaint  Patient presents with  . Dizziness  . Gait Problem  . Speech Problem    HPI Carl Lam presents for onset this morning of some balance issues, dizziness, and possible speech change.  Patient relates last Thursday had one day of diarrhea but no diarrhea symptoms since then.  He recalls walking once over the weekend but felt overheated.  Usually walks several miles per day for exercise and is very functional.  He was basically though at baseline until this morning when he got up.  His wife noticed some speech change.  She did not really describe this as slurred speech but more of a hoarseness.  She noticed his gait was "off" and he seemed to be "limping "and favoring his left lower extremity.  Patient denies any left lower extremity pain.  There was apparently some question of left upper and lower extremity weakness earlier today.  He states that he has been slightly "foggy "in his thinking but no severe confusion.  He was able to ambulate into our office without assistance but with notable gait change.  He is swallowing without difficulty.  Wife thinks that she may have observed some facial asymmetry earlier today  His chronic problems include type 2 diabetes, hypertension, past history of lower GI diverticulosis bleed, hyperlipidemia.  No prior history of cerebrovascular disease.  No recent chest pains. Current medications include Metformin, lisinopril, Basaglar insulin, and atorvastatin.  His diabetes has generally been well controlled.  Recent A1c 6.0%.  Past Medical History:  Diagnosis Date  . Acute lower GI bleeding 10/11/11  . DEGENERATIVE JOINT DISEASE 10/27/2008   Qualifier: Diagnosis of  By: Elease Hashimoto MD, Urian Martenson    . DIAB W/O COMP TYPE II/UNS NOT STATED UNCNTRL 10/27/2008   Qualifier: Diagnosis of  By: Valma Cava LPN, Izora Gala    .  HYPERLIPIDEMIA 04/27/2009   Qualifier: Diagnosis of  By: Elease Hashimoto MD, Brittany Osier    . HYPERTENSION 10/27/2008   Qualifier: Diagnosis of  By: Valma Cava LPN, Izora Gala      Past Surgical History:  Procedure Laterality Date  . COLONOSCOPY  10/12/2011   Procedure: COLONOSCOPY;  Surgeon: Lafayette Dragon, MD;  Location[facility] ENDOSCOPY;  Service: Endoscopy;  Laterality: N/A;  . ESOPHAGOGASTRODUODENOSCOPY  10/12/2011   Procedure: ESOPHAGOGASTRODUODENOSCOPY (EGD);  Surgeon: Lafayette Dragon, MD;  Location: [facility] ENDOSCOPY;  Service: Endoscopy;  Laterality: N/A;  . HERNIA REPAIR  2007   "navel"  . JOINT REPLACEMENT    . KNEE CARTILAGE SURGERY  1960's and 1970's   left; "2 total"  . TOTAL HIP ARTHROPLASTY  2007    Family History  Problem Relation Age of Onset  . Alcohol abuse Other        grandparent  . Arthritis Other        arthritis  . Diabetes Other        grandparent    Social History   Socioeconomic History  . Marital status: Married    Spouse name: Not on file  . Number of children: Not on file  . Years of education: Not on file  . Highest education level: Not on file  Occupational History  . Not on file  Tobacco Use  . Smoking status: Former Smoker    Packs/day: 0.30    Years: 15.00    Pack years: 4.50  Types: Cigarettes    Quit date: 10/11/1978    Years since quitting: 41.2  . Smokeless tobacco: Never Used  Vaping Use  . Vaping Use: Never used  Substance and Sexual Activity  . Alcohol use: Yes    Comment: 10/11/11 "last alcohol 27-28 years ago"  . Drug use: Yes    Types: Marijuana    Comment: "recreational marijuana in [employer]; 1970's"  . Sexual activity: Not Currently  Other Topics Concern  . Not on file  Social History Narrative  . Not on file   Social Determinants of Health   Financial Resource Strain:   . Difficulty of Paying Living Expenses: Not on file  Food Insecurity:   . Worried About Charity fundraiser in the Last Year: Not on file  . Ran Out of Food in the Last  Year: Not on file  Transportation Needs:   . Lack of Transportation (Medical): Not on file  . Lack of Transportation (Non-Medical): Not on file  Physical Activity:   . Days of Exercise per Week: Not on file  . Minutes of Exercise per Session: Not on file  Stress:   . Feeling of Stress : Not on file  Social Connections:   . Frequency of Communication with Friends and Family: Not on file  . Frequency of Social Gatherings with Friends and Family: Not on file  . Attends Religious Services: Not on file  . Active Member of Clubs or Organizations: Not on file  . Attends Archivist Meetings: Not on file  . Marital Status: Not on file  Intimate Partner Violence:   . Fear of Current or Ex-Partner: Not on file  . Emotionally Abused: Not on file  . Physically Abused: Not on file  . Sexually Abused: Not on file    Outpatient Medications Prior to Visit  Medication Sig Dispense Refill  . atorvastatin (LIPITOR) 20 MG tablet Take 1 tablet (20 mg total) by mouth daily. 90 tablet 3  . gabapentin (NEURONTIN) 100 MG capsule Take 2 capsules (200 mg total) by mouth 2 (two) times daily. 360 capsule 3  . Insulin Glargine (BASAGLAR KWIKPEN) 100 UNIT/ML INJECT 30 UNITS SUBCUTANEOUSLY AT BEDTIME (Patient taking differently: Inject 25 Units into the skin daily. ) 15 mL 0  . lisinopril (ZESTRIL) 20 MG tablet Take 1 tablet (20 mg total) by mouth daily. 90 tablet 3  . metFORMIN (GLUCOPHAGE) 500 MG tablet Take two tablets twice daily. (Patient taking differently: 1,000 mg. Take two tablets twice daily.) 360 tablet 3   No facility-administered medications prior to visit.    No Known Allergies  ROS Review of Systems  Constitutional: Negative for appetite change, chills, fever and unexpected weight change.  HENT: Negative for facial swelling and trouble swallowing.   Eyes: Negative for visual disturbance.  Respiratory: Negative for cough and shortness of breath.   Cardiovascular: Negative for chest  pain and leg swelling.  Gastrointestinal: Negative for abdominal pain.  Genitourinary: Negative for dysuria.  Neurological: Positive for dizziness and weakness. Negative for seizures, syncope and headaches.  Psychiatric/Behavioral: Negative for confusion.      Objective:    Physical Exam Vitals reviewed.  Constitutional:      General: He is not in acute distress.    Appearance: He is not ill-appearing.  Cardiovascular:     Rate and Rhythm: Normal rate and regular rhythm.  Pulmonary:     Effort: Pulmonary effort is normal.     Breath sounds: Normal breath sounds.  Musculoskeletal:  Right lower leg: No edema.     Left lower leg: No edema.  Neurological:     Mental Status: He is alert.     Comments: There is question of subtle slight weakness right face compared to the left.  He has good grip strength bilaterally.  No pronator drift.  Does have some mild ataxia with finger-to-nose testing.  He has fairly good strength with plantarflexion, dorsiflexion, knee extension, and hip flexion bilaterally.  Oriented to person, place, and time     BP (!) 150/98   Pulse 86   Temp 98.3 F (36.8 C) (Oral)   Ht 6\' 1"  (1.854 m)   Wt 236 lb (107 kg)   SpO2 99%   BMI 31.14 kg/m  Wt Readings from Last 3 Encounters:  12/14/19 236 lb (107 kg)  11/20/19 237 lb 4.8 oz (107.6 kg)  07/20/19 237 lb 4.8 oz (107.6 kg)     Health Maintenance Due  Topic Date Due  . COVID-19 Vaccine (1) Never done  . TETANUS/TDAP  Never done    There are no preventive care reminders to display for this patient.  Lab Results  Component Value Date   TSH 2.66 07/15/2017   Lab Results  Component Value Date   WBC 4.9 07/15/2017   HGB 15.0 07/15/2017   HCT 44.2 07/15/2017   MCV 90.9 07/15/2017   PLT 279.0 07/15/2017   Lab Results  Component Value Date   NA 142 11/20/2019   K 4.6 11/20/2019   CO2 26 11/20/2019   GLUCOSE 96 11/20/2019   BUN 22 11/20/2019   CREATININE 1.44 (H) 11/20/2019   BILITOT  0.6 11/20/2019   ALKPHOS 70 10/15/2018   AST 12 11/20/2019   ALT 10 11/20/2019   PROT 7.2 11/20/2019   ALBUMIN 4.3 10/15/2018   CALCIUM 9.3 11/20/2019   GFR 65.02 10/15/2018   Lab Results  Component Value Date   CHOL 140 11/20/2019   Lab Results  Component Value Date   HDL 49 11/20/2019   Lab Results  Component Value Date   LDLCALC 77 11/20/2019   Lab Results  Component Value Date   TRIG 68 11/20/2019   Lab Results  Component Value Date   CHOLHDL 2.9 11/20/2019   Lab Results  Component Value Date   HGBA1C 6.0 (A) 11/20/2019      Assessment & Plan:   Patient with history of type 2 diabetes, hypertension, hyperlipidemia who presents with onset this morning of some dizziness, gait disturbance, possible mild speech change.  Concern is TIA versus CVA.  Sounds like his symptoms have improved some through the day but he is still having some difficulty with ambulation.  We recommended ER evaluation to further assess.  His wife is accompanying him and we gave him option of EMS transport but she wishes to transport  No orders of the defined types were placed in this encounter.   Follow-up: No follow-ups on file.    Carolann Littler, MD

## 2019-12-14 NOTE — ED Triage Notes (Signed)
Pt sent here by PCP for stroke workup d/t symptoms of slurred speech noticed by wife around 0700 this morning and gait problem. Pt reports that the last time he was normal was Saturday evening.

## 2019-12-14 NOTE — ED Notes (Signed)
Patient transported to MRI 

## 2019-12-14 NOTE — ED Notes (Signed)
Wife- Ilhan Madan- wife- [phone]

## 2019-12-14 NOTE — H&P (Signed)
History and Physical    Carl Lam:[id number] DOB: 07/08/1945 DOA: 12/14/2019  PCP: Eulas Post, MD  Patient coming from: Home.  Chief Complaint: Weakness.  HPI: Carl Lam is a 74 y.o. male with history of hypertension, diabetes, hyperlipidemia, chronic kidney disease stage II was referred to the ER by patient's primary care physician after patient was found to be increasingly weak and difficult to walk with gait disturbances.  Patient states his symptoms initially started on December 10, 2019 when he had a large bowel movement following which he felt weak and dizzy.  Denies any vomiting or abdominal pain.  The following 3 days he did well and today since waking up he felt very weak and was finding it difficult to balance his gait.  Patient was taken to his primary care patient was referred to the ER.  ED Course: In the ER on exam patient has right-sided weakness more on the right lower extremity CT head is unremarkable neurology on-call was consulted MRI brain has been ordered for possible stroke.  In addition patient is found to have drop in hemoglobin of almost 6 g from 2 years ago and is around 8.7 now stool for occult blood was positive.  EKG shows normal sinus rhythm.  Patient admitted for acute CVA and GI bleed.  Covid test is negative.  Review of Systems: As per HPI, rest all negative.   Past Medical History:  Diagnosis Date  . Acute lower GI bleeding 10/11/11  . DEGENERATIVE JOINT DISEASE 10/27/2008   Qualifier: Diagnosis of  By: Elease Hashimoto MD, Bruce    . DIAB W/O COMP TYPE II/UNS NOT STATED UNCNTRL 10/27/2008   Qualifier: Diagnosis of  By: Valma Cava LPN, Izora Gala    . HYPERLIPIDEMIA 04/27/2009   Qualifier: Diagnosis of  By: Elease Hashimoto MD, Bruce    . HYPERTENSION 10/27/2008   Qualifier: Diagnosis of  By: Valma Cava LPN, Izora Gala      Past Surgical History:  Procedure Laterality Date  . COLONOSCOPY  10/12/2011   Procedure: COLONOSCOPY;  Surgeon: Lafayette Dragon, MD;   Location: [facility] ENDOSCOPY;  Service: Endoscopy;  Laterality: N/A;  . ESOPHAGOGASTRODUODENOSCOPY  10/12/2011   Procedure: ESOPHAGOGASTRODUODENOSCOPY (EGD);  Surgeon: Lafayette Dragon, MD;  Location: [facility] ENDOSCOPY;  Service: Endoscopy;  Laterality: N/A;  . HERNIA REPAIR  2007   "navel"  . JOINT REPLACEMENT    . KNEE CARTILAGE SURGERY  1960's and 1970's   left; "2 total"  . TOTAL HIP ARTHROPLASTY  2007     reports that he quit smoking about 41 years ago. His smoking use included cigarettes. He has a 4.50 pack-year smoking history. He has never used smokeless tobacco. He reports current alcohol use. He reports current drug use. Drug: Marijuana.  No Known Allergies  Family History  Problem Relation Age of Onset  . Alcohol abuse Other        grandparent  . Arthritis Other        arthritis  . Diabetes Other        grandparent    Prior to Admission medications   Medication Sig Start Date End Date Taking? Authorizing Provider  atorvastatin (LIPITOR) 20 MG tablet Take 1 tablet (20 mg total) by mouth daily. 01/16/19   Burchette, Alinda Sierras, MD  gabapentin (NEURONTIN) 100 MG capsule Take 2 capsules (200 mg total) by mouth 2 (two) times daily. 01/16/19   Burchette, Alinda Sierras, MD  Insulin Glargine (BASAGLAR KWIKPEN) 100 UNIT/ML INJECT 30 UNITS SUBCUTANEOUSLY AT BEDTIME Patient  taking differently: Inject 25 Units into the skin daily.  10/26/19   Burchette, Alinda Sierras, MD  lisinopril (ZESTRIL) 20 MG tablet Take 1 tablet (20 mg total) by mouth daily. 11/20/19   Burchette, Alinda Sierras, MD  metFORMIN (GLUCOPHAGE) 500 MG tablet Take two tablets twice daily. Patient taking differently: 1,000 mg. Take two tablets twice daily. 04/20/19   Burchette, Alinda Sierras, MD    Physical Exam: Constitutional: Moderately built and nourished. Vitals:   12/14/19 2115 12/14/19 2130 12/14/19 2214 12/14/19 2215  BP: 129/88 138/86 (!) 139/93 (!) 134/96  Pulse: 75 74 84 81  Resp: 19 16 18 18   Temp:      TempSrc:      SpO2: 99% 98% 99% 99%    Weight:       Eyes: Anicteric no pallor. ENMT: No discharge from the ears eyes nose or mouth. Neck: No mass felt.  No neck rigidity. Respiratory: No rhonchi or crepitations. Cardiovascular: S1-S2 heard. Abdomen: Soft nontender bowel sounds present. Musculoskeletal: No edema. Skin: No rash. Neurologic: Alert awake oriented to time place and person.  Has right lower extremity weakness around 3 x 5 right upper extremity around 2 x 5 at rest with extremities of 5 x 5.  No facial asymmetry.  Tongue is midline.  Pupils are equal and reacting to light. Psychiatric: Appears normal.  Normal affect.   Labs on Admission: I have personally reviewed following labs and imaging studies  CBC: Recent Labs  Lab 12/14/19 1822 12/14/19 1825  WBC 5.9  --   NEUTROABS 3.7  --   HGB 8.7* 8.8*  HCT 27.0* 26.0*  MCV 94.1  --   PLT 243  --    Basic Metabolic Panel: Recent Labs  Lab 12/14/19 1822 12/14/19 1825  NA 141 144  K 4.4 4.4  CL 109 107  CO2 26  --   GLUCOSE 196* 182*  BUN 14 16  CREATININE 1.78* 1.80*  CALCIUM 9.2  --    GFR: Estimated Creatinine Clearance: 46.5 mL/min (A) (by C-G formula based on SCr of 1.8 mg/dL (H)). Liver Function Tests: Recent Labs  Lab 12/14/19 1822  AST 13*  ALT 11  ALKPHOS 43  BILITOT 0.3  PROT 6.8  ALBUMIN 3.8   No results for input(s): LIPASE, AMYLASE in the last 168 hours. No results for input(s): AMMONIA in the last 168 hours. Coagulation Profile: Recent Labs  Lab 12/14/19 1822  INR 1.0   Cardiac Enzymes: No results for input(s): CKTOTAL, CKMB, CKMBINDEX, TROPONINI in the last 168 hours. BNP (last 3 results) No results for input(s): PROBNP in the last 8760 hours. HbA1C: No results for input(s): HGBA1C in the last 72 hours. CBG: No results for input(s): GLUCAP in the last 168 hours. Lipid Profile: No results for input(s): CHOL, HDL, LDLCALC, TRIG, CHOLHDL, LDLDIRECT in the last 72 hours. Thyroid Function Tests: No results for  input(s): TSH, T4TOTAL, FREET4, T3FREE, THYROIDAB in the last 72 hours. Anemia Panel: No results for input(s): VITAMINB12, FOLATE, FERRITIN, TIBC, IRON, RETICCTPCT in the last 72 hours. Urine analysis: No results found for: COLORURINE, APPEARANCEUR, LABSPEC, PHURINE, GLUCOSEU, HGBUR, BILIRUBINUR, KETONESUR, PROTEINUR, UROBILINOGEN, NITRITE, LEUKOCYTESUR Sepsis Labs: @LABRCNTIP (procalcitonin:4,lacticidven:4) )No results found for this or any previous visit (from the past 240 hour(s)).   Radiological Exams on Admission: CT HEAD WO CONTRAST  Result Date: 12/14/2019 CLINICAL DATA:  Slurred speech since 7 a.m., abnormal gait, last known well sign a morning EXAM: CT HEAD WITHOUT CONTRAST TECHNIQUE: Contiguous axial images were obtained  from the base of the skull through the vertex without intravenous contrast. COMPARISON:  None. FINDINGS: Brain: No acute infarct or hemorrhage. Lateral ventricles and midline structures are unremarkable. No acute extra-axial fluid collections. No mass effect. Vascular: Significant atherosclerosis of the internal carotid arteries. No hyperdense vessel. Skull: Normal. Negative for fracture or focal lesion. Sinuses/Orbits: Mild mucosal thickening within the anterior ethmoid air cells. Remaining paranasal sinuses are clear. Other: None. IMPRESSION: 1. No acute intracranial process. Electronically Signed   By: Randa Ngo M.D.   On: 12/14/2019 19:03    EKG: Independently reviewed.  Normal sinus rhythm.  Assessment/Plan Principal Problem:   Acute CVA (cerebrovascular accident) [facility]) Active Problems:   Type 2 diabetes mellitus with hyperglycemia ([place])   Essential hypertension   Anemia due to blood loss, acute   Acute GI bleeding    1. Acute CVA -patient symptoms are consistent with acute CVA.  Patient did pass swallow.  MRI brain carotid Doppler 2D echo has been ordered.  No aspirin has been given due to acute GI bleed.  Check hemoglobin A1c lipid panel  neurochecks. 2. Acute GI bleed -source not clear.  Patient did have a colonoscopy in 2013 which showed colonic diverticulosis at the time EGD was unremarkable.  Patient was diagnosed with colonic bleed at the time.  Will trend CBC and if there is hemoglobin dropping less than 8 given the stroke will transfuse PRBC for which patient was agreed to.  Consult GI in the morning.  Keep patient on Protonix. 3. Hypertension we'll allow for permissive hypertension given the acute CVA. 4. Diabetes mellitus type 2 we'll keep patient on sliding scale coverage. 5. Acute blood loss anemia due to bleeding and if there is any further drop in hemoglobin less than 8 will transfuse given the CVA. 6. Acute on chronic kidney disease stage II likely from bleeding.  Patient also uses lisinopril which could have further contributed.  Hold lisinopril transfuse if there is any further drop in hemoglobin.  Given that patient has acute CVA and also GI bleed will need close monitoring for any further worsening in inpatient status.   DVT prophylaxis: SCDs.  Patient has acute GI bleed will hold off anticoagulation. Code Status: Full code. Family Communication: Discussed with patient. Disposition Plan: Home. Consults called: Neurology. Admission status: Inpatient.   Rise Patience MD [facility] Pager [phone].  If 7PM-7AM, please contact night-coverage [email or web address] Password [email or web address]  12/14/2019, 10:58 PM

## 2019-12-14 NOTE — ED Provider Notes (Signed)
[place] EMERGENCY DEPARTMENT Provider Note   CSN: [id number] Arrival date & time: 12/14/19  1735     History Chief Complaint  Patient presents with   Aphasia   Gait Problem    Carl Lam is a 74 y.o. male.  HPI Patient presents with weakness.  Normally able to walk 5 to 10 miles but states been having difficulty doing that the last couple days.  States he feels unsteady.  Also reportedly had some slight difficulty speaking earlier today.  Reportedly has been feeling as if his right side may be a little off.  Patient states he does not feel that it is different but was not walking as well.  Few days ago had some black stool.  Has not had any since.  No abdominal pain.  No headache.  Has had diverticular bleeds in the past.    Past Medical History:  Diagnosis Date   Acute lower GI bleeding 10/11/11   DEGENERATIVE JOINT DISEASE 10/27/2008   Qualifier: Diagnosis of  By: Elease Hashimoto MD, Bruce     DIAB W/O COMP TYPE II/UNS NOT STATED UNCNTRL 10/27/2008   Qualifier: Diagnosis of  By: Valma Cava LPN, Jamal Maes 04/27/2009   Qualifier: Diagnosis of  By: Elease Hashimoto MD, Wallace     HYPERTENSION 10/27/2008   Qualifier: Diagnosis of  By: Valma Cava LPN, Nancy      Patient Active Problem List   Diagnosis Date Noted   Paresthesias 09/02/2018   Paronychia 01/19/2012   Diverticulosis 10/19/2011   Anemia due to blood loss, acute 10/12/2011   GI bleed 10/11/2011   Hyperlipidemia 04/27/2009   Type 2 diabetes mellitus with hyperglycemia ([place]) 10/27/2008   Essential hypertension 10/27/2008   DEGENERATIVE JOINT DISEASE 10/27/2008    Past Surgical History:  Procedure Laterality Date   COLONOSCOPY  10/12/2011   Procedure: COLONOSCOPY;  Surgeon: Lafayette Dragon, MD;  Location: [facility] ENDOSCOPY;  Service: Endoscopy;  Laterality: N/A;   ESOPHAGOGASTRODUODENOSCOPY  10/12/2011   Procedure: ESOPHAGOGASTRODUODENOSCOPY (EGD);  Surgeon: Lafayette Dragon, MD;   Location: [facility] ENDOSCOPY;  Service: Endoscopy;  Laterality: N/A;   HERNIA REPAIR  2007   "navel"   JOINT REPLACEMENT     KNEE CARTILAGE SURGERY  1960's and 1970's   left; "2 total"   TOTAL HIP ARTHROPLASTY  2007       Family History  Problem Relation Age of Onset   Alcohol abuse Other        grandparent   Arthritis Other        arthritis   Diabetes Other        grandparent    Social History   Tobacco Use   Smoking status: Former Smoker    Packs/day: 0.30    Years: 15.00    Pack years: 4.50    Types: Cigarettes    Quit date: 10/11/1978    Years since quitting: 41.2   Smokeless tobacco: Never Used  Vaping Use   Vaping Use: Never used  Substance Use Topics   Alcohol use: Yes    Comment: 10/11/11 "last alcohol 27-28 years ago"   Drug use: Yes    Types: Marijuana    Comment: "recreational marijuana in [employer]; 1970's"    Home Medications Prior to Admission medications   Medication Sig Start Date End Date Taking? Authorizing Provider  atorvastatin (LIPITOR) 20 MG tablet Take 1 tablet (20 mg total) by mouth daily. 01/16/19   Burchette, Alinda Sierras, MD  gabapentin (NEURONTIN) 100 MG  capsule Take 2 capsules (200 mg total) by mouth 2 (two) times daily. 01/16/19   Burchette, Alinda Sierras, MD  Insulin Glargine (BASAGLAR KWIKPEN) 100 UNIT/ML INJECT 30 UNITS SUBCUTANEOUSLY AT BEDTIME Patient taking differently: Inject 25 Units into the skin daily.  10/26/19   Burchette, Alinda Sierras, MD  lisinopril (ZESTRIL) 20 MG tablet Take 1 tablet (20 mg total) by mouth daily. 11/20/19   Burchette, Alinda Sierras, MD  metFORMIN (GLUCOPHAGE) 500 MG tablet Take two tablets twice daily. Patient taking differently: 1,000 mg. Take two tablets twice daily. 04/20/19   Burchette, Alinda Sierras, MD    Allergies    Patient has no known allergies.  Review of Systems   Review of Systems  Constitutional: Positive for fatigue. Negative for appetite change.  HENT: Negative for congestion.   Respiratory: Negative for  shortness of breath.   Gastrointestinal: Negative for abdominal pain.  Genitourinary: Negative for flank pain.  Skin: Negative for rash.  Neurological: Positive for speech difficulty and weakness. Negative for headaches.  Psychiatric/Behavioral: Negative for confusion.    Physical Exam Updated Vital Signs BP 129/88    Pulse 75    Temp 98.8 F (37.1 C) (Oral)    Resp 19    Wt 105.2 kg    SpO2 99%    BMI 30.61 kg/m   Physical Exam Vitals and nursing note reviewed.  HENT:     Head: Normocephalic.  Eyes:     Pupils: Pupils are equal, round, and reactive to light.  Cardiovascular:     Rate and Rhythm: Regular rhythm.  Pulmonary:     Breath sounds: No wheezing or rhonchi.  Abdominal:     Tenderness: There is no abdominal tenderness.  Genitourinary:    Rectum: Guaiac result positive.     Comments: Brown stool that may have small specks of blood. Musculoskeletal:     Cervical back: Neck supple.  Skin:    General: Skin is warm.     Coloration: Skin is not jaundiced.  Neurological:     Mental Status: He is alert.     Comments: Eye movements intact.  Face symmetric.  Normal speech.  Finger-nose may be off a little on the right.  Heel shin may also be off little bit.  May have less strength in upper and lower extremities on the right compared to left also.  He is however left-handed.     ED Results / Procedures / Treatments   Labs (all labs ordered are listed, but only abnormal results are displayed) Labs Reviewed  CBC - Abnormal; Notable for the following components:      Result Value   RBC 2.87 (*)    Hemoglobin 8.7 (*)    HCT 27.0 (*)    All other components within normal limits  COMPREHENSIVE METABOLIC PANEL - Abnormal; Notable for the following components:   Glucose, Bld 196 (*)    Creatinine, Ser 1.78 (*)    AST 13 (*)    GFR calc non Af Amer 37 (*)    GFR calc Af Amer 43 (*)    All other components within normal limits  I-STAT CHEM 8, ED - Abnormal; Notable for the  following components:   Creatinine, Ser 1.80 (*)    Glucose, Bld 182 (*)    Hemoglobin 8.8 (*)    HCT 26.0 (*)    All other components within normal limits  POC OCCULT BLOOD, ED - Abnormal; Notable for the following components:   Fecal Occult Bld POSITIVE (*)  All other components within normal limits  SARS CORONAVIRUS 2 BY RT PCR (HOSPITAL ORDER, [facility])  PROTIME-INR  APTT  DIFFERENTIAL    EKG None  Radiology CT HEAD WO CONTRAST  Result Date: 12/14/2019 CLINICAL DATA:  Slurred speech since 7 a.m., abnormal gait, last known well sign a morning EXAM: CT HEAD WITHOUT CONTRAST TECHNIQUE: Contiguous axial images were obtained from the base of the skull through the vertex without intravenous contrast. COMPARISON:  None. FINDINGS: Brain: No acute infarct or hemorrhage. Lateral ventricles and midline structures are unremarkable. No acute extra-axial fluid collections. No mass effect. Vascular: Significant atherosclerosis of the internal carotid arteries. No hyperdense vessel. Skull: Normal. Negative for fracture or focal lesion. Sinuses/Orbits: Mild mucosal thickening within the anterior ethmoid air cells. Remaining paranasal sinuses are clear. Other: None. IMPRESSION: 1. No acute intracranial process. Electronically Signed   By: Randa Ngo M.D.   On: 12/14/2019 19:03    Procedures Procedures (including critical care time)  Medications Ordered in ED Medications  sodium chloride flush (NS) 0.9 % injection 3 mL (3 mLs Intravenous Not Given 12/14/19 2025)    ED Course  I have reviewed the triage vital signs and the nursing notes.  Pertinent labs & imaging results that were available during my care of the patient were reviewed by me and considered in my medical decision making (see chart for details).    MDM Rules/Calculators/A&P                          Patient presents with weakness both generalized and potentially lateralizing to the right.  Head  CT done reassuring, however found to have an anemia and mildly elevated creatinine.  Had overheated few days ago reportedly.  Guaiac positive.  Reassuring vitals however.  Will admit to hospitalist.  Neurology will see patient for the focal neuro deficits and he is ordered an MRI. Final Clinical Impression(s) / ED Diagnoses Final diagnoses:  Weakness  Anemia, unspecified type  Gastrointestinal hemorrhage, unspecified gastrointestinal hemorrhage type    Rx / DC Orders ED Discharge Orders    None       Davonna Belling, MD 12/14/19 2148

## 2019-12-14 NOTE — Consult Note (Signed)
Neurology Consultation Reason for Consult: Unsteadiness Referring Physician: Robyn Haber  CC: Unsteadiness  History is obtained from: Patient  HPI: Carl Lam is a 74 y.o. male with a history of hypertension, hyperlipidemia who presents with unsteadiness that has been present to some degree since Thursday, though significantly worsening today.  He states that he felt off balance on Thursday.  He also notes a dark and tarry stool that occurred on Thursday, he felt that he had eaten too many blueberries.  Today, his symptoms significantly worsened and that prompted him to seek care in the emergency department today.  The ER physician noticed some unilateral deficits and therefore neurology was consulted, but the patient does not seem to be aware of them.   LKW: Last Thursday tpa given?: no, outside window   ROS: A 14 point ROS was performed and is negative except as noted in the HPI.  Past Medical History:  Diagnosis Date  . Acute lower GI bleeding 10/11/11  . DEGENERATIVE JOINT DISEASE 10/27/2008   Qualifier: Diagnosis of  By: Elease Hashimoto MD, Bruce    . DIAB W/O COMP TYPE II/UNS NOT STATED UNCNTRL 10/27/2008   Qualifier: Diagnosis of  By: Valma Cava LPN, Izora Gala    . HYPERLIPIDEMIA 04/27/2009   Qualifier: Diagnosis of  By: Elease Hashimoto MD, Bruce    . HYPERTENSION 10/27/2008   Qualifier: Diagnosis of  By: Valma Cava LPN, Izora Gala       Family History  Problem Relation Age of Onset  . Alcohol abuse Other        grandparent  . Arthritis Other        arthritis  . Diabetes Other        grandparent     Social History:  reports that he quit smoking about 41 years ago. His smoking use included cigarettes. He has a 4.50 pack-year smoking history. He has never used smokeless tobacco. He reports current alcohol use. He reports current drug use. Drug: Marijuana.   Exam: Current vital signs: BP (!) 139/93 (BP Location: Left Arm)   Pulse 84   Temp 98.8 F (37.1 C) (Oral)   Resp 18   Wt 105.2  kg   SpO2 99%   BMI 30.61 kg/m  Vital signs in last 24 hours: Temp:  [98.3 F (36.8 C)-98.9 F (37.2 C)] 98.8 F (37.1 C) (08/30 2020) Pulse Rate:  [74-88] 84 (08/30 2214) Resp:  [14-21] 18 (08/30 2214) BP: (129-169)/(86-101) 139/93 (08/30 2214) SpO2:  [98 %-100 %] 99 % (08/30 2214) Weight:  [105.2 kg-107 kg] 105.2 kg (08/30 1743)   Physical Exam  Constitutional: Appears well-developed and well-nourished.  Psych: Affect appropriate to situation Eyes: No scleral injection HENT: No OP obstrucion MSK: no joint deformities.  Cardiovascular: Normal rate and regular rhythm.  Respiratory: Effort normal, non-labored breathing GI: Soft.  No distension. There is no tenderness.  Skin: WDI  Neuro: Mental Status: Patient is awake, alert, oriented to person, place, month, year, and situation. Patient is able to give a clear and coherent history. No signs of aphasia or neglect Cranial Nerves: II: Visual Fields are full. Pupils are equal, round, and reactive to light.   III,IV, VI: EOMI without ptosis or diploplia.  V: Facial sensation is symmetric to temperature VII: Facial movement is symmetric.  VIII: hearing is intact to voice X: Uvula elevates symmetrically XI: Shoulder shrug is symmetric. XII: tongue is midline without atrophy or fasciculations.  Motor: Tone is normal. Bulk is normal. 5/5 strength was present on the left, on  the right he has 4/5 weakness of the right arm and leg Sensory: Sensation is symmetric to light touch and temperature in the arms and legs. Cerebellar: He has significant ataxia in the right     I have reviewed labs in epic and the results pertinent to this consultation are: Hemoglobin 8.7 Creatinine 1.8  I have reviewed the images obtained: CT head-negative MRI brain-pontine infarct.  Impression: 74 year old male with a history of hypertension, hyperlipidemia, GI bleed who presents with acute pontine infarct.  His hemoglobin of 8.7 is of unclear  acuity, but with a report of a recent dark tarry stool in the day some of his symptoms started, I am concerned that it may be more acute.  He will need antiplatelet therapy long-term, but I would favor further evaluation prior to starting antiplatelets.  Also with his creatinine being higher than previous, will hold off on contrast and get MRA head and neck without contrast.  Recommendations: - HgbA1c, fasting lipid panel -MRA of the brain without contrast, MRA of the neck without contrast - Frequent neuro checks - Echocardiogram - Prophylactic therapy-Antiplatelet med: Aspirin -81 mg once cleared by internal medicine or GI - Risk factor modification - Telemetry monitoring - PT consult, OT consult, Speech consult - Stroke team to follow   Roland Rack, MD [facility] [phone]  If 7pm- 7am, please page neurology on call as listed in [place].

## 2019-12-14 NOTE — Patient Instructions (Addendum)
Go straight to the ER for further evaluation.  My Concern  Is to rule out TIA or stroke

## 2019-12-15 ENCOUNTER — Inpatient Hospital Stay (HOSPITAL_COMMUNITY): Payer: Medicare Other

## 2019-12-15 ENCOUNTER — Encounter (HOSPITAL_COMMUNITY): Payer: Federal, State, Local not specified - PPO

## 2019-12-15 DIAGNOSIS — K922 Gastrointestinal hemorrhage, unspecified: Secondary | ICD-10-CM

## 2019-12-15 DIAGNOSIS — D62 Acute posthemorrhagic anemia: Secondary | ICD-10-CM

## 2019-12-15 DIAGNOSIS — I6389 Other cerebral infarction: Secondary | ICD-10-CM

## 2019-12-15 DIAGNOSIS — I639 Cerebral infarction, unspecified: Principal | ICD-10-CM

## 2019-12-15 LAB — CBC
HCT: 24.9 % — ABNORMAL LOW (ref 39.0–52.0)
HCT: 26.7 % — ABNORMAL LOW (ref 39.0–52.0)
Hemoglobin: 7.9 g/dL — ABNORMAL LOW (ref 13.0–17.0)
Hemoglobin: 8.6 g/dL — ABNORMAL LOW (ref 13.0–17.0)
MCH: 30 pg (ref 26.0–34.0)
MCH: 30.1 pg (ref 26.0–34.0)
MCHC: 31.7 g/dL (ref 30.0–36.0)
MCHC: 32.2 g/dL (ref 30.0–36.0)
MCV: 94.7 fL (ref 80.0–100.0)
MCV: 93.4 fL (ref 80.0–100.0)
Platelets: 228 10*3/uL (ref 150–400)
Platelets: 214 10*3/uL (ref 150–400)
RBC: 2.63 MIL/uL — ABNORMAL LOW (ref 4.22–5.81)
RBC: 2.86 MIL/uL — ABNORMAL LOW (ref 4.22–5.81)
RDW: 15.5 % (ref 11.5–15.5)
RDW: 15.8 % — ABNORMAL HIGH (ref 11.5–15.5)
WBC: 7.3 10*3/uL (ref 4.0–10.5)
WBC: 7.4 10*3/uL (ref 4.0–10.5)
nRBC: 0 % (ref 0.0–0.2)
nRBC: 0 % (ref 0.0–0.2)

## 2019-12-15 LAB — GLUCOSE, CAPILLARY
Glucose-Capillary: 165 mg/dL — ABNORMAL HIGH (ref 70–99)
Glucose-Capillary: 103 mg/dL — ABNORMAL HIGH (ref 70–99)
Glucose-Capillary: 105 mg/dL — ABNORMAL HIGH (ref 70–99)

## 2019-12-15 LAB — CBG MONITORING, ED
Glucose-Capillary: 131 mg/dL — ABNORMAL HIGH (ref 70–99)
Glucose-Capillary: 143 mg/dL — ABNORMAL HIGH (ref 70–99)
Glucose-Capillary: 135 mg/dL — ABNORMAL HIGH (ref 70–99)
Glucose-Capillary: 129 mg/dL — ABNORMAL HIGH (ref 70–99)
Glucose-Capillary: 111 mg/dL — ABNORMAL HIGH (ref 70–99)

## 2019-12-15 LAB — ECHOCARDIOGRAM COMPLETE
Area-P 1/2: 3.42 cm2
Calc EF: 53.4 %
Height: 73 in
S' Lateral: 3.1 cm
Single Plane A2C EF: 51.9 %
Single Plane A4C EF: 53.3 %
Weight: 3712 oz

## 2019-12-15 LAB — LIPID PANEL
Cholesterol: 107 mg/dL (ref 0–200)
HDL: 36 mg/dL — ABNORMAL LOW (ref >40)
LDL Cholesterol: 59 mg/dL (ref 0–99)
Total CHOL/HDL Ratio: 3 RATIO
Triglycerides: 61 mg/dL (ref <150)
VLDL: 12 mg/dL (ref 0–40)

## 2019-12-15 LAB — SARS CORONAVIRUS 2 BY RT PCR (HOSPITAL ORDER, PERFORMED IN ~~LOC~~ HOSPITAL LAB): SARS Coronavirus 2: NEGATIVE

## 2019-12-15 LAB — HEMOGLOBIN A1C
Hgb A1c MFr Bld: 6 % — ABNORMAL HIGH (ref 4.8–5.6)
Mean Plasma Glucose: 125.5 mg/dL

## 2019-12-15 LAB — PREPARE RBC (CROSSMATCH)

## 2019-12-15 MED ORDER — LORAZEPAM 2 MG/ML IJ SOLN
0.2500 mg | Freq: Once | INTRAMUSCULAR | Status: AC
  Administered 2019-12-15: 0.25 mg via INTRAVENOUS
  Filled 2019-12-15: qty 1

## 2019-12-15 MED ORDER — PANTOPRAZOLE SODIUM 40 MG IV SOLR
40.0000 mg | Freq: Two times a day (BID) | INTRAVENOUS | Status: DC
  Administered 2019-12-15: 40 mg via INTRAVENOUS
  Filled 2019-12-15 (×2): qty 40

## 2019-12-15 MED ORDER — PEG-KCL-NACL-NASULF-NA ASC-C 100 G PO SOLR
0.5000 | Freq: Once | ORAL | Status: AC
  Administered 2019-12-16: 100 g via ORAL
  Filled 2019-12-15: qty 1

## 2019-12-15 MED ORDER — PEG-KCL-NACL-NASULF-NA ASC-C 100 G PO SOLR
0.5000 | Freq: Once | ORAL | Status: AC
  Administered 2019-12-15: 100 g via ORAL
  Filled 2019-12-15 (×2): qty 1

## 2019-12-15 MED ORDER — HYDRALAZINE HCL 20 MG/ML IJ SOLN: 10.0000 mg | INTRAMUSCULAR | Status: DC | PRN

## 2019-12-15 MED ORDER — PEG-KCL-NACL-NASULF-NA ASC-C 100 G PO SOLR: 1.0000 | Freq: Once | ORAL | Status: DC

## 2019-12-15 MED ORDER — SODIUM CHLORIDE 0.9% IV SOLUTION: Freq: Once | INTRAVENOUS | Status: AC

## 2019-12-15 NOTE — Progress Notes (Signed)
PROGRESS NOTE    Carl Lam  FYB:[id number] DOB: September 21, 1945 DOA: 12/14/2019 PCP: Eulas Post, MD   Chief Complaint  Patient presents with  . Aphasia  . Gait Problem    Brief Narrative:  74 year old gentleman prior history of hypertension diabetes, hyperlipidemia, stage II CKD was referred by patient's PCP for generalized weakness unable to walk and gait disturbances.  Patient reports his symptoms initially started on December 10, 2019.  On exam patient was found to have right-sided weakness.  CT of the head was unremarkable.  MRI of the brain was ordered for evaluation of stroke.  Neurology was consulted.  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  An MRI of the brain.  MRI of the head and neck was ordered for further evaluation.  Patient was also incidentally found to have hemoglobin between 7-8.  Baseline hemoglobin was around 15 in 2019.  Gastroenterology was consulted for further evaluation of guaiac positive stools and anemia.  Patient seen and examined at bedside currently denies any new complaints.  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 Further evaluation with echocardiogram, carotid duplex. Hemoglobin A1c and LDL pending SLP/PT/OT evaluations are pending. Not on aspirin at this time due to guaiac positive stools and GI work-up for anemia. Neurology on board and pending recommendations.     Acute GI bleed Guaiac positive, hemoglobin dropped from 15-8 in the last 2 years. GI consulted and plan for EGD tomorrow. Continue with IV PPI twice daily.     Essential hypertension Allow permissive hypertension given acute CVA.     Type 2 diabetes mellitus Continue with sliding scale insulin and get hemoglobin  A1c.     Stage II CKD Continue to hold lisinopril  Recheck renal parameters in the morning.    DVT prophylaxis: SCDs Code Status: (Full code Family Communication: Family at bedside Disposition:   Status is: Inpatient  Remains inpatient appropriate because:Ongoing diagnostic testing needed not appropriate for outpatient work up   Dispo: The patient is from: Home              Anticipated d/c is to: pending              Anticipated d/c date is: 2 days              Patient currently is not medically stable to d/c.       Consultants:   Neurology  Gastroenterology  Procedures: Echocardiogram  Antimicrobials: None  Subjective: No new complaints  Objective: Vitals:   12/15/19 1330 12/15/19 1400 12/15/19 1500 12/15/19 1526  BP: (!) 157/92 124/85 125/83 (!) 142/85  Pulse: 96 70 70 75  Resp:    18  Temp:    98 F (36.7 C)  TempSrc:    Oral  SpO2: 100% 99% 100% 100%  Weight:        Intake/Output Summary (Last 24 hours) at 12/15/2019 1634 Last data filed at 12/15/2019 0347 Gross per 24 hour  Intake 500 ml  Output --  Net 500 ml   Filed Weights   12/14/19 1743  Weight: 105.2 kg    Examination:  General exam: Appears calm and comfortable  Respiratory system: Clear to auscultation. Respiratory effort normal. Cardiovascular  system: S1 & S2 heard, RRR. No JVD,  No pedal edema. Gastrointestinal system: Abdomen is nondistended, soft and nontender.  Normal bowel sounds heard. Central nervous system: Alert and oriented.  Right-sided weakness. Extremities: No cyanosis or clubbing. Skin: No rashes, lesions or ulcers Psychiatry:  Mood & affect appropriate.     Data Reviewed: I have personally reviewed following labs and imaging studies  CBC: Recent Labs  Lab 12/14/19 1822 12/14/19 1825 12/15/19 0258 12/15/19 1411  WBC 5.9  --  7.3 7.4  NEUTROABS 3.7  --   --   --   HGB 8.7* 8.8* 7.9* 8.6*  HCT 27.0* 26.0* 24.9* 26.7*  MCV 94.1  --  94.7 93.4  PLT  243  --  228 673    Basic Metabolic Panel: Recent Labs  Lab 12/14/19 1822 12/14/19 1825  NA 141 144  K 4.4 4.4  CL 109 107  CO2 26  --   GLUCOSE 196* 182*  BUN 14 16  CREATININE 1.78* 1.80*  CALCIUM 9.2  --     GFR: Estimated Creatinine Clearance: 46.5 mL/min (A) (by C-G formula based on SCr of 1.8 mg/dL (H)).  Liver Function Tests: Recent Labs  Lab 12/14/19 1822  AST 13*  ALT 11  ALKPHOS 43  BILITOT 0.3  PROT 6.8  ALBUMIN 3.8    CBG: Recent Labs  Lab 12/15/19 0109 12/15/19 0448 12/15/19 0700 12/15/19 0801 12/15/19 1134  GLUCAP 131* 143* 135* 129* 111*     Recent Results (from the past 240 hour(s))  SARS Coronavirus 2 by RT PCR (hospital order, performed in [facility] hospital lab) Nasopharyngeal Nasopharyngeal Swab     Status: None   Collection Time: 12/14/19 10:17 PM   Specimen: Nasopharyngeal Swab  Result Value Ref Range Status   SARS Coronavirus 2 NEGATIVE NEGATIVE Final    Comment: (NOTE) SARS-CoV-2 target nucleic acids are NOT DETECTED.  The SARS-CoV-2 RNA is generally detectable in upper and lower respiratory specimens during the acute phase of infection. The lowest concentration of SARS-CoV-2 viral copies this assay can detect is 250 copies / mL. A negative result does not preclude SARS-CoV-2 infection and should not be used as the sole basis for treatment or other patient management decisions.  A negative result may occur with improper specimen collection / handling, submission of specimen other than nasopharyngeal swab, presence of viral mutation(s) within the areas targeted by this assay, and inadequate number of viral copies (<250 copies / mL). A negative result must be combined with clinical observations, patient history, and epidemiological information.  Fact Sheet for Patients:   StrictlyIdeas.no  Fact Sheet for Healthcare Providers: BankingDealers.co.za  This test is not yet approved  or  cleared by the [place] FDA and has been authorized for detection and/or diagnosis of SARS-CoV-2 by FDA under an Emergency Use Authorization (EUA).  This EUA will remain in effect (meaning this test can be used) for the duration of the COVID-19 declaration under Section 564(b)(1) of the Act, 21 U.S.C. section 360bbb-3(b)(1), unless the authorization is terminated or revoked sooner.  Performed at [facility], [place] [address], [place], [place] [address]          Radiology Studies: CT HEAD WO CONTRAST  Result Date: 12/14/2019 CLINICAL DATA:  Slurred speech since 7 a.m., abnormal gait, last known well sign a morning EXAM: CT HEAD WITHOUT CONTRAST TECHNIQUE: Contiguous axial images were obtained from the base of the skull through the vertex without intravenous contrast. COMPARISON:  None.  FINDINGS: Brain: No acute infarct or hemorrhage. Lateral ventricles and midline structures are unremarkable. No acute extra-axial fluid collections. No mass effect. Vascular: Significant atherosclerosis of the internal carotid arteries. No hyperdense vessel. Skull: Normal. Negative for fracture or focal lesion. Sinuses/Orbits: Mild mucosal thickening within the anterior ethmoid air cells. Remaining paranasal sinuses are clear. Other: None. IMPRESSION: 1. No acute intracranial process. Electronically Signed   By: Randa Ngo M.D.   On: 12/14/2019 19:03   MR ANGIO HEAD WO CONTRAST  Result Date: 12/15/2019 CLINICAL DATA:  Stroke follow-up EXAM: MRA NECK WITHOUT CONTRAST MRA HEAD WITHOUT CONTRAST TECHNIQUE: Angiographic images of the neck and circle of Willis were obtained using MRA technique without intravenous contrast. COMPARISON:  None. FINDINGS: MRA NECK FINDINGS Normal 3 vessel branching pattern of the aorta. Vertebral arteries are left-dominant. Both vertebral arteries are normal to the skull base. The carotid systems are normal. MRA HEAD FINDINGS POSTERIOR CIRCULATION: --Vertebral arteries:  Normal V4 segments. --Inferior cerebellar arteries: Normal. --Basilar artery: Normal. --Superior cerebellar arteries: Normal. --Posterior cerebral arteries: Normal. There are bilateral posterior communicating arteries (p-comm) that partially supply the PCAs. ANTERIOR CIRCULATION: --Intracranial internal carotid arteries: Ectatic appearance of the terminal portion of the left internal carotid artery, at the origin of the left MCA. There are heterogeneous flow related enhancement characteristics. Normal right ICA. --Anterior cerebral arteries (ACA): Normal. Both A1 segments are present. Patent anterior communicating artery (a-comm). --Middle cerebral arteries (MCA): Normal. IMPRESSION: 1. No occlusion or hemodynamically significant stenosis by NASCET criteria. 2. 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 CTA of the head is recommended. Electronically Signed   By: Ulyses Jarred M.D.   On: 12/15/2019 03:30   MR ANGIO NECK WO CONTRAST  Result Date: 12/15/2019 CLINICAL DATA:  Stroke follow-up EXAM: MRA NECK WITHOUT CONTRAST MRA HEAD WITHOUT CONTRAST TECHNIQUE: Angiographic images of the neck and circle of Willis were obtained using MRA technique without intravenous contrast. COMPARISON:  None. FINDINGS: MRA NECK FINDINGS Normal 3 vessel branching pattern of the aorta. Vertebral arteries are left-dominant. Both vertebral arteries are normal to the skull base. The carotid systems are normal. MRA HEAD FINDINGS POSTERIOR CIRCULATION: --Vertebral arteries: Normal V4 segments. --Inferior cerebellar arteries: Normal. --Basilar artery: Normal. --Superior cerebellar arteries: Normal. --Posterior cerebral arteries: Normal. There are bilateral posterior communicating arteries (p-comm) that partially supply the PCAs. ANTERIOR CIRCULATION: --Intracranial internal carotid arteries: Ectatic appearance of the terminal portion of the left internal carotid artery, at the origin of the  left MCA. There are heterogeneous flow related enhancement characteristics. Normal right ICA. --Anterior cerebral arteries (ACA): Normal. Both A1 segments are present. Patent anterior communicating artery (a-comm). --Middle cerebral arteries (MCA): Normal. IMPRESSION: 1. No occlusion or hemodynamically significant stenosis by NASCET criteria. 2. 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 CTA of the head is recommended. Electronically Signed   By: Ulyses Jarred M.D.   On: 12/15/2019 03:30   MR BRAIN WO CONTRAST  Result Date: 12/14/2019 CLINICAL DATA:  Dizziness EXAM: MRI HEAD WITHOUT CONTRAST TECHNIQUE: Multiplanar, multiecho pulse sequences of the brain and surrounding structures were obtained without intravenous contrast. COMPARISON:  None. FINDINGS: Brain: There is an acute infarct of the ventral left pons. Mild periventricular white matter hyperintensity. Mild generalized volume loss. Midline structures are normal. No chronic microhemorrhage. Vascular: Normal flow voids. Skull and upper cervical spine: Normal Sinuses/Orbits: Bilateral ocular lens replacements. Sinuses are clear. 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 No hemorrhage  or mass effect. Electronically Signed   By: Ulyses Jarred M.D.   On: 12/14/2019 23:00   ECHOCARDIOGRAM COMPLETE  Result Date: 12/15/2019    ECHOCARDIOGRAM REPORT   Patient Name:   Carl Lam Date of Exam: 12/15/2019 Medical Rec #:  [id number]         Height:       73.0 in Accession #:    [id number]        Weight:       232.0 lb Date of Birth:  10/10/1945         BSA:          2.292 m Patient Age:    57 years          BP:           127/83 mmHg Patient Gender: M                 HR:           83 bpm. Exam Location:  Inpatient Procedure: 2D Echo, Cardiac Doppler and Color Doppler Indications:    Stroke 434.91 / I163.9  History:        Patient has no prior history of Echocardiogram examinations.                  Risk Factors:Hypertension, Diabetes, Dyslipidemia and Former                 Smoker.  Sonographer:    Vickie Epley RDCS Referring Phys: [place]  1. Left ventricular ejection fraction, by estimation, is 60 to 65%. The left ventricle has normal function. The left ventricle has no regional wall motion abnormalities. Left ventricular diastolic parameters are consistent with Grade I diastolic dysfunction (impaired relaxation).  2. Right ventricular systolic function is normal. The right ventricular size is normal. Tricuspid regurgitation signal is inadequate for assessing PA pressure.  3. The mitral valve is normal in structure. No evidence of mitral valve regurgitation. No evidence of mitral stenosis.  4. The aortic valve is tricuspid. Aortic valve regurgitation is not visualized. No aortic stenosis is present.  5. Aortic dilatation noted. There is mild dilatation of the aortic root measuring 38 mm.  6. The inferior vena cava is normal in size with greater than 50% respiratory variability, suggesting right atrial pressure of 3 mmHg. FINDINGS  Left Ventricle: Left ventricular ejection fraction, by estimation, is 60 to 65%. The left ventricle has normal function. The left ventricle has no regional wall motion abnormalities. The left ventricular internal cavity size was normal in size. There is  no left ventricular hypertrophy. Left ventricular diastolic parameters are consistent with Grade I diastolic dysfunction (impaired relaxation). Right Ventricle: The right ventricular size is normal. No increase in right ventricular wall thickness. Right ventricular systolic function is normal. Tricuspid regurgitation signal is inadequate for assessing PA pressure. Left Atrium: Left atrial size was normal in size. Right Atrium: Right atrial size was normal in size. Pericardium: There is no evidence of pericardial effusion. Mitral Valve: The mitral valve is normal in structure. No evidence of mitral  valve regurgitation. No evidence of mitral valve stenosis. Tricuspid Valve: The tricuspid valve is normal in structure. Tricuspid valve regurgitation is not demonstrated. Aortic Valve: The aortic valve is tricuspid. Aortic valve regurgitation is not visualized. No aortic stenosis is present. Pulmonic Valve: The pulmonic valve was normal in structure. Pulmonic valve regurgitation is not visualized. Aorta: Aortic dilatation noted. There is mild dilatation of the  aortic root measuring 38 mm. Venous: The inferior vena cava is normal in size with greater than 50% respiratory variability, suggesting right atrial pressure of 3 mmHg. IAS/Shunts: No atrial level shunt detected by color flow Doppler.  LEFT VENTRICLE PLAX 2D LVIDd:         4.70 cm      Diastology LVIDs:         3.10 cm      LV e' lateral:   6.64 cm/s LV PW:         0.80 cm      LV E/e' lateral: 7.3 LV IVS:        0.80 cm      LV e' medial:    4.57 cm/s LVOT diam:     2.60 cm      LV E/e' medial:  10.7 LV SV:         89 LV SV Index:   39 LVOT Area:     5.31 cm  LV Volumes (MOD) LV vol d, MOD A2C: 83.5 ml LV vol d, MOD A4C: 100.0 ml LV vol s, MOD A2C: 40.2 ml LV vol s, MOD A4C: 46.7 ml LV SV MOD A2C:     43.3 ml LV SV MOD A4C:     100.0 ml LV SV MOD BP:      50.1 ml RIGHT VENTRICLE RV S prime:     11.30 cm/s TAPSE (M-mode): 1.7 cm LEFT ATRIUM             Index       RIGHT ATRIUM          Index LA diam:        4.20 cm 1.83 cm/m  RA Area:     9.16 cm LA Vol (A2C):   33.7 ml 14.71 ml/m RA Volume:   19.60 ml 8.55 ml/m LA Vol (A4C):   33.6 ml 14.66 ml/m LA Biplane Vol: 34.0 ml 14.84 ml/m  AORTIC VALVE LVOT Vmax:   82.90 cm/s LVOT Vmean:  56.300 cm/s LVOT VTI:    0.168 m  AORTA Ao Root diam: 3.80 cm MITRAL VALVE MV Area (PHT): 3.42 cm    SHUNTS MV Decel Time: 222 msec    Systemic VTI:  0.17 m MV E velocity: 48.80 cm/s  Systemic Diam: 2.60 cm MV A velocity: 77.10 cm/s MV E/A ratio:  0.63 Loralie Champagne MD Electronically signed by Loralie Champagne MD Signature  Date/Time: 12/15/2019/4:22:38 PM    Final         Scheduled Meds: . insulin aspart  0-6 Units Subcutaneous Q4H  . insulin glargine  10 Units Subcutaneous Q2200  . pantoprazole (PROTONIX) IV  40 mg Intravenous Q12H  . peg 3350 powder  0.5 kit Oral Once   And  . [START ON 12/16/2019] peg 3350 powder  0.5 kit Oral Once  . sodium chloride flush  3 mL Intravenous Once   Continuous Infusions: . sodium chloride Stopped (12/15/19 1527)     LOS: 1 day        Hosie Poisson, MD [facility]   To contact the attending provider between 7A-7P or the covering provider during after hours 7P-7A, please log into the web site [email or web address] and access using universal [place] password for that web site. If you do not have the password, please call the hospital operator.  12/15/2019, 4:34 PM

## 2019-12-15 NOTE — Progress Notes (Signed)
  Echocardiogram 2D Echocardiogram has been performed.  Michiel Cowboy 12/15/2019, 11:37 AM

## 2019-12-15 NOTE — ED Notes (Signed)
3W unable to take report at this time.  

## 2019-12-15 NOTE — Plan of Care (Signed)
  Problem: Coping: Goal: Will verbalize positive feelings about self Outcome: Progressing   Problem: Self-Care: Goal: Ability to participate in self-care as condition permits will improve Outcome: Progressing Goal: Ability to communicate needs accurately will improve Outcome: Progressing

## 2019-12-15 NOTE — Progress Notes (Signed)
STROKE TEAM PROGRESS NOTE   HISTORY OF PRESENT ILLNESS (per record) Carl Lam is a 74 y.o. male with a history of hypertension, hyperlipidemia who presents with unsteadiness that has been present to some degree since Thursday, though significantly worsening today.  He states that he felt off balance on Thursday.  He also notes a dark and tarry stool that occurred on Thursday, he felt that he had eaten too many blueberries.  Today, his symptoms significantly worsened and that prompted him to seek care in the emergency department today. The ER physician noticed some unilateral deficits and therefore neurology was consulted, but the patient does not seem to be aware of them. LKW: Last Thursday tpa given?: no, outside window   INTERVAL HISTORY  I have personally reviewed history of presenting illness, electronic medical records and imaging films in PACS. His wife is at bedside. He has had a GI bleed and is getting blood transfusion. GI wants to do endoscopy. He has prior h/o diverticular bleed.MRI scan shows acute left Pons infarct.MRAs show only mild athero changes.12/15/2019 cholesterol is 59 mg%.HbA1c is 6.0 Echo and carotid dopplers are pending.   OBJECTIVE Vitals:   12/15/19 0104 12/15/19 0400 12/15/19 0606 12/15/19 0621  BP: (!) 104/58 134/82 128/82 136/87  Pulse: 84 87 79 80  Resp: 18 16 16 18   Temp:   97.6 F (36.4 C) 97.7 F (36.5 C)  TempSrc:   Oral Oral  SpO2: 100% 99% 100% 100%  Weight:        CBC:  Recent Labs  Lab 12/14/19 1822 12/14/19 1822 12/14/19 1825 12/15/19 0258  WBC 5.9  --   --  7.3  NEUTROABS 3.7  --   --   --   HGB 8.7*   < > 8.8* 7.9*  HCT 27.0*   < > 26.0* 24.9*  MCV 94.1  --   --  94.7  PLT 243  --   --  228   < > = values in this interval not displayed.    Basic Metabolic Panel:  Recent Labs  Lab 12/14/19 1822 12/14/19 1825  NA 141 144  K 4.4 4.4  CL 109 107  CO2 26  --   GLUCOSE 196* 182*  BUN 14 16  CREATININE 1.78* 1.80*   CALCIUM 9.2  --     IMAGING  CT HEAD WO CONTRAST 12/14/2019 IMPRESSION:  No acute intracranial process.   MR ANGIO HEAD WO CONTRAST MR ANGIO NECK WO CONTRAST 12/15/2019 IMPRESSION:  1. No occlusion or hemodynamically significant stenosis by NASCET criteria.  2. 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 CTA of the head is recommended.   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 No hemorrhage or mass effect.   Transthoracic Echocardiogram  Left ventricular ejection fraction, by estimation, is 60 to 65%  Bilateral Carotid Dopplers  Not needed. See MRA neck  ECG - SR rate 85 BPM. (See cardiology reading for complete details)   PHYSICAL EXAM Blood pressure 136/87, pulse 80, temperature 97.7 F (36.5 C), temperature source Oral, resp. rate 18, weight 105.2 kg, SpO2 100 %. Pleasant elderly african [place] male not in distress. . Afebrile. Head is nontraumatic. Neck is supple without bruit.    Cardiac exam no murmur or gallop. Lungs are clear to auscultation. Distal pulses are well felt.  Neurological Exam ;  Awake  Alert oriented x 3. Normal speech and language.eye movements full without nystagmus.fundi  were not visualized. Vision acuity and fields appear normal. Hearing is normal. Palatal movements are normal. Face symmetric. Tongue midline. Mild right hemiparesis 4/5 with weakness right grip and diminished fine finger movements on right and orbits left over right upper extremity.. Normal sensation. Gait deferred.      ASSESSMENT/PLAN [name] is a 74 y.o. male with history of hypertension, hyperlipidemia, diabetes, hx of Gi bleed (recent dark and tarry stool possibly 2/2 blueberry consumption) who presents with unsteadiness that has been present to some degree since Thursday, worse on day of admission. He states that he felt off balance on Thursday.  He also notes a dark  and tarry stool that occurred on Thursday, he felt that he had eaten too many blueberries. He did not receive IV t-PA due to late presentation (>4.5 hours from time of onset).   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 Patient also having acute GI bleed  Resultant  Mild right hemiparesis  Code Stroke CT Head - not ordered      CT head - No acute intracranial process.   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  MRA head - No occlusion or hemodynamically significant stenosis by NASCET criteria. 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 CTA of the head is recommended. creatinine - 1.80   CTA H&N - not ordered  CT Perfusion - not ordered  Carotid Doppler - not ordered. See MRA neck  2D Echo - normal EF.  Sars Corona Virus 2 - negative  LDL - 59  HgbA1c - 6.0  UDS - not ordered  VTE prophylaxis - SCDs Diet  Diet Order            Diet NPO time specified Except for: Ice Chips  Diet effective now                 No antithrombotic prior to admission, now on No antithrombotic (aspirin being held 2/2 to possible Gi bleeding)  Patient will be counseled to be compliant with his antithrombotic medications  Ongoing aggressive stroke risk factor management  Therapy recommendations:  pending  Disposition:  Pending  Hypertension  Home BP meds: Zestril  Current BP meds: hydralazine prn  Stable . Permissive hypertension (OK if < 220/120) but gradually normalize in 5-7 days . Long-term BP goal normotensive  Hyperlipidemia  Home Lipid lowering medication: Lipitor 20 mg daily  LDL pending, goal < 70  Current lipid lowering medication: none / NPO   Continue statin at discharge  Diabetes  Home diabetic meds: metformin and insulin  Current diabetic meds: SSI   HgbA1c pending, goal < 7.0 Recent Labs    12/15/19 0109 12/15/19 0448  GLUCAP 131* 143*    Other  Stroke Risk Factors  Advanced age  Former cigarette smoker - quit   Obesity, Body mass index is 30.61 kg/m., recommend weight loss, diet and exercise as appropriate   Substance Abuse - hx of marijuana use   Other Active Problems  Code status - Full code  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 CTA of the head is recommended. (creatinine - 1.80) CKD - stage 3b - creatinine - 1.80  Anemia - Hgb - 7.9 Possible Gi bleeding - aspirin being held until cleared by Gi.  Hospital day # 1  He presented with right hemiparesis secondary to pontine infarct likely from small vessel disease  due to lower GI bleeding possibly from diverticulitis.  Patient will likely need to have endoscopy done to determine source of bleeding and treatment  to stop it hence will hold off on antiplatelet therapy for now.  I had a long discussion with patient and wife and answered questions.d/w Dr Karleen Hampshire.I have spent a total of  35  minutes with the patient reviewing hospital notes,  test results, labs and examining the patient as well as establishing an assessment and plan of care about his stroke and GI bleeding that was discussed personally with the patient.  > 50% of time was spent in direct patient care.  Antony Contras, MD       To contact Stroke Continuity provider, please refer to [email or web address] After hours, contact General Neurology

## 2019-12-15 NOTE — H&P (View-Only) (Signed)
Referring Provider:  [facility]         Primary Care Physician:  Eulas Post, MD Primary Gastroenterologist: none We were asked to see this patient for:   GI bleed               ASSESSMENT /  PLAN     # Dark, loose heme + stools --Not actively bleeding. Had several dark, loose stools last Thursday - Friday. No BMs since.  --  Rule out upper GI bleed. BUN normal. Need to consider right sided diverticular hemorrhage. Had similar presentation in 2013. EGD negative. Blood throughout colon with poor prep.  --Needs EGD but probably repeat colonoscopy as well given upcoming need for anti-platelet therapy. I have reached out to Neurology about how soon they want to start CVA treatment and whether patient is safe to undergo sedation as these things will dictate when we can proceed with procedures.  --Agree with BID IV PPI --From GI standpoint he could have clear liquids but will hold off ordering until admitting team / Neuro has cleared swallowing   # Acute blood loss anemia.  --Hgb down several grams compared to 2019 ( no interval labs). --Will obtain post transfusion H+H .  # Acute CVA --work up pending  # AKI Cr 1.8                                                                                                                                HPI:    Chief Complaint:  Dark stools / anemia  Carl Lam is a 74 y.o. male with pmh significant for but not limited to DM2, HTN, hyperlipidemia, CVA ( this admit), diverticulosis, abdominal hernia repair. We evaluated him in the hospital in 2013 for GI bleeding. EGD unrevealing. Colonoscopy remarkable for diverticulosis. Bleeding scan negative. The GI bleed was a presumed right sided diverticular hemorrhage. Patient had no bleeding after that until now. This past Thursday ( 5 days ago) he had several episodes of dark loose stools. By Friday his stools were clearing up and he hasn't had a BM since. No associated abdominal pain  or N/V. The only NSAID he takes is Aleve as needed but averages less than 3-4 doses a month.   Patient seen by PCP yesterday. He felt "loopy", had difficulty with gait and speech. Sent to ED. MRI of brain remarkable for acute infarct of ventral left pons. He will eventually need anti-platelet therapy per Neurology. In ED his hgb was 8.7, down from 15 in 2019 ( no interval labs to compare). Today hgb is 7.9. He has received a unit of blood this am.     PREVIOUS ENDOSCOPIC EVALUATIONS / GI STUDIES :  June 2013 Colonoscopy for hematochezia --poor prep --blood throughout colon. Presumed right sided diverticular hemorrhage  June 2013 EGD for hematochezia --small hiatal hernia  Past Medical History:  Diagnosis Date  . Acute lower GI bleeding 10/11/11  .  DEGENERATIVE JOINT DISEASE 10/27/2008   Qualifier: Diagnosis of  By: Elease Hashimoto MD, Bruce    . DIAB W/O COMP TYPE II/UNS NOT STATED UNCNTRL 10/27/2008   Qualifier: Diagnosis of  By: Valma Cava LPN, Izora Gala    . HYPERLIPIDEMIA 04/27/2009   Qualifier: Diagnosis of  By: Elease Hashimoto MD, Bruce    . HYPERTENSION 10/27/2008   Qualifier: Diagnosis of  By: Valma Cava LPN, Izora Gala      Past Surgical History:  Procedure Laterality Date  . COLONOSCOPY  10/12/2011   Procedure: COLONOSCOPY;  Surgeon: Lafayette Dragon, MD;  Location: [facility] ENDOSCOPY;  Service: Endoscopy;  Laterality: N/A;  . ESOPHAGOGASTRODUODENOSCOPY  10/12/2011   Procedure: ESOPHAGOGASTRODUODENOSCOPY (EGD);  Surgeon: Lafayette Dragon, MD;  Location[facility] ENDOSCOPY;  Service: Endoscopy;  Laterality: N/A;  . HERNIA REPAIR  2007   "navel"  . JOINT REPLACEMENT    . KNEE CARTILAGE SURGERY  1960's and 1970's   left; "2 total"  . TOTAL HIP ARTHROPLASTY  2007    Prior to Admission medications   Medication Sig Start Date End Date Taking? Authorizing Provider  atorvastatin (LIPITOR) 20 MG tablet Take 1 tablet (20 mg total) by mouth daily. 01/16/19  Yes Burchette, Alinda Sierras, MD  gabapentin (NEURONTIN) 100 MG capsule  Take 2 capsules (200 mg total) by mouth 2 (two) times daily. 01/16/19  Yes Burchette, Alinda Sierras, MD  Insulin Glargine (BASAGLAR KWIKPEN) 100 UNIT/ML INJECT 30 UNITS SUBCUTANEOUSLY AT BEDTIME Patient taking differently: Inject 25 Units into the skin daily.  10/26/19  Yes Burchette, Alinda Sierras, MD  lisinopril (ZESTRIL) 20 MG tablet Take 1 tablet (20 mg total) by mouth daily. 11/20/19  Yes Burchette, Alinda Sierras, MD  metFORMIN (GLUCOPHAGE) 500 MG tablet Take two tablets twice daily. Patient taking differently: Take 1,000 mg by mouth 2 (two) times daily with a meal. Take two tablets twice daily. 04/20/19  Yes Burchette, Alinda Sierras, MD    Current Facility-Administered Medications  Medication Dose Route Frequency Provider Last Rate Last Admin  . 0.9 %  sodium chloride infusion   Intravenous Continuous Rise Patience, MD 75 mL/hr at 12/15/19 0122 New Bag at 12/15/19 0122  . acetaminophen (TYLENOL) tablet 650 mg  650 mg Oral Q4H PRN Rise Patience, MD       Or  . acetaminophen (TYLENOL) 160 MG/5ML solution 650 mg  650 mg Per Tube Q4H PRN Rise Patience, MD       Or  . acetaminophen (TYLENOL) suppository 650 mg  650 mg Rectal Q4H PRN Rise Patience, MD      . hydrALAZINE (APRESOLINE) injection 10 mg  10 mg Intravenous Q4H PRN Rise Patience, MD      . insulin aspart (novoLOG) injection 0-6 Units  0-6 Units Subcutaneous Q4H Rise Patience, MD      . insulin glargine (LANTUS) injection 10 Units  10 Units Subcutaneous Q2200 Rise Patience, MD      . pantoprazole (PROTONIX) injection 40 mg  40 mg Intravenous Q12H Rise Patience, MD      . sodium chloride flush (NS) 0.9 % injection 3 mL  3 mL Intravenous Once Rise Patience, MD       Current Outpatient Medications  Medication Sig Dispense Refill  . atorvastatin (LIPITOR) 20 MG tablet Take 1 tablet (20 mg total) by mouth daily. 90 tablet 3  . gabapentin (NEURONTIN) 100 MG capsule Take 2 capsules (200 mg total) by mouth 2  (two) times daily. 360 capsule 3  .  Insulin Glargine (BASAGLAR KWIKPEN) 100 UNIT/ML INJECT 30 UNITS SUBCUTANEOUSLY AT BEDTIME (Patient taking differently: Inject 25 Units into the skin daily. ) 15 mL 0  . lisinopril (ZESTRIL) 20 MG tablet Take 1 tablet (20 mg total) by mouth daily. 90 tablet 3  . metFORMIN (GLUCOPHAGE) 500 MG tablet Take two tablets twice daily. (Patient taking differently: Take 1,000 mg by mouth 2 (two) times daily with a meal. Take two tablets twice daily.) 360 tablet 3    Allergies as of 12/14/2019  . (No Known Allergies)    Family History  Problem Relation Age of Onset  . Alcohol abuse Other        grandparent  . Arthritis Other        arthritis  . Diabetes Other        grandparent    Social History   Socioeconomic History  . Marital status: Married    Spouse name: Not on file  . Number of children: Not on file  . Years of education: Not on file  . Highest education level: Not on file  Occupational History  . Not on file  Tobacco Use  . Smoking status: Former Smoker    Packs/day: 0.30    Years: 15.00    Pack years: 4.50    Types: Cigarettes    Quit date: 10/11/1978    Years since quitting: 41.2  . Smokeless tobacco: Never Used  Vaping Use  . Vaping Use: Never used  Substance and Sexual Activity  . Alcohol use: Yes    Comment: 10/11/11 "last alcohol 27-28 years ago"  . Drug use: Yes    Types: Marijuana    Comment: "recreational marijuana in [employer]; 1970's"  . Sexual activity: Not Currently  Other Topics Concern  . Not on file  Social History Narrative  . Not on file   Social Determinants of Health   Financial Resource Strain:   . Difficulty of Paying Living Expenses: Not on file  Food Insecurity:   . Worried About Charity fundraiser in the Last Year: Not on file  . Ran Out of Food in the Last Year: Not on file  Transportation Needs:   . Lack of Transportation (Medical): Not on file  . Lack of Transportation (Non-Medical): Not on file    Physical Activity:   . Days of Exercise per Week: Not on file  . Minutes of Exercise per Session: Not on file  Stress:   . Feeling of Stress : Not on file  Social Connections:   . Frequency of Communication with Friends and Family: Not on file  . Frequency of Social Gatherings with Friends and Family: Not on file  . Attends Religious Services: Not on file  . Active Member of Clubs or Organizations: Not on file  . Attends Archivist Meetings: Not on file  . Marital Status: Not on file  Intimate Partner Violence:   . Fear of Current or Ex-Partner: Not on file  . Emotionally Abused: Not on file  . Physically Abused: Not on file  . Sexually Abused: Not on file    Review of Systems: All systems reviewed and negative except where noted in HPI.  Physical Exam: Vital signs in last 24 hours: Temp:  [97.6 F (36.4 C)-98.9 F (37.2 C)] 97.7 F (36.5 C) (08/31 0903) Pulse Rate:  [74-88] 77 (08/31 0903) Resp:  [14-21] 18 (08/31 0903) BP: (104-169)/(58-101) 143/70 (08/31 0903) SpO2:  [98 %-100 %] 99 % (08/31 0903) Weight:  [105.2  kg-107 kg] 105.2 kg (08/30 1743)   General:   Alert, well-developed,  male in NAD Psych:  Pleasant, cooperative. Normal mood and affect. Eyes:  Pupils equal, sclera clear, no icterus.   Conjunctiva pink. Ears:  Normal auditory acuity. Nose:  No deformity, discharge,  or lesions. Neck:  Supple; no masses Lungs:  Clear throughout to auscultation.   No wheezes, crackles, or rhonchi.  Heart:  Regular rate and rhythm; no lower extremity edema Abdomen:  Soft, non-distended, nontender, BS active, no palp mass   Rectal:  No stool or blood in vaultt  Msk:  Symmetrical without gross deformities. . Neurologic:  Alert and  oriented x4 Skin:  Intact without significant lesions or rashes.  Scheduled inpatient medications . insulin aspart  0-6 Units Subcutaneous Q4H  . insulin glargine  10 Units Subcutaneous Q2200  . pantoprazole (PROTONIX) IV  40 mg  Intravenous Q12H  . sodium chloride flush  3 mL Intravenous Once      Intake/Output from previous day: 08/30 0701 - 08/31 0700 In: 500 [IV Piggyback:500] Out: -  Intake/Output this shift: No intake/output data recorded.  Lab Results: Recent Labs    12/14/19 1822 12/14/19 1825 12/15/19 0258  WBC 5.9  --  7.3  HGB 8.7* 8.8* 7.9*  HCT 27.0* 26.0* 24.9*  PLT 243  --  228   BMET Recent Labs    12/14/19 1822 12/14/19 1825  NA 141 144  K 4.4 4.4  CL 109 107  CO2 26  --   GLUCOSE 196* 182*  BUN 14 16  CREATININE 1.78* 1.80*  CALCIUM 9.2  --    LFT Recent Labs    12/14/19 1822  PROT 6.8  ALBUMIN 3.8  AST 13*  ALT 11  ALKPHOS 43  BILITOT 0.3   PT/INR Recent Labs    12/14/19 1822  LABPROT 12.8  INR 1.0   Hepatitis Panel No results for input(s): HEPBSAG, HCVAB, HEPAIGM, HEPBIGM in the last 72 hours.   . CBC Latest Ref Rng & Units 12/15/2019 12/14/2019 12/14/2019  WBC 4.0 - 10.5 K/uL 7.3 - 5.9  Hemoglobin 13.0 - 17.0 g/dL 7.9(L) 8.8(L) 8.7(L)  Hematocrit 39 - 52 % 24.9(L) 26.0(L) 27.0(L)  Platelets 150 - 400 K/uL 228 - 243    . CMP Latest Ref Rng & Units 12/14/2019 12/14/2019 11/20/2019  Glucose 70 - 99 mg/dL 182(H) 196(H) 96  BUN 8 - 23 mg/dL 16 14 22   Creatinine 0.61 - 1.24 mg/dL 1.80(H) 1.78(H) 1.44(H)  Sodium 135 - 145 mmol/L 144 141 142  Potassium 3.5 - 5.1 mmol/L 4.4 4.4 4.6  Chloride 98 - 111 mmol/L 107 109 107  CO2 22 - 32 mmol/L - 26 26  Calcium 8.9 - 10.3 mg/dL - 9.2 9.3  Total Protein 6.5 - 8.1 g/dL - 6.8 7.2  Total Bilirubin 0.3 - 1.2 mg/dL - 0.3 0.6  Alkaline Phos 38 - 126 U/L - 43 -  AST 15 - 41 U/L - 13(L) 12  ALT 0 - 44 U/L - 11 10   Studies/Results: CT HEAD WO CONTRAST  Result Date: 12/14/2019 CLINICAL DATA:  Slurred speech since 7 a.m., abnormal gait, last known well sign a morning EXAM: CT HEAD WITHOUT CONTRAST TECHNIQUE: Contiguous axial images were obtained from the base of the skull through the vertex without intravenous  contrast. COMPARISON:  None. FINDINGS: Brain: No acute infarct or hemorrhage. Lateral ventricles and midline structures are unremarkable. No acute extra-axial fluid collections. No mass effect. Vascular: Significant atherosclerosis of  the internal carotid arteries. No hyperdense vessel. Skull: Normal. Negative for fracture or focal lesion. Sinuses/Orbits: Mild mucosal thickening within the anterior ethmoid air cells. Remaining paranasal sinuses are clear. Other: None. IMPRESSION: 1. No acute intracranial process. Electronically Signed   By: Randa Ngo M.D.   On: 12/14/2019 19:03   MR ANGIO HEAD WO CONTRAST  Result Date: 12/15/2019 CLINICAL DATA:  Stroke follow-up EXAM: MRA NECK WITHOUT CONTRAST MRA HEAD WITHOUT CONTRAST TECHNIQUE: Angiographic images of the neck and circle of Willis were obtained using MRA technique without intravenous contrast. COMPARISON:  None. FINDINGS: MRA NECK FINDINGS Normal 3 vessel branching pattern of the aorta. Vertebral arteries are left-dominant. Both vertebral arteries are normal to the skull base. The carotid systems are normal. MRA HEAD FINDINGS POSTERIOR CIRCULATION: --Vertebral arteries: Normal V4 segments. --Inferior cerebellar arteries: Normal. --Basilar artery: Normal. --Superior cerebellar arteries: Normal. --Posterior cerebral arteries: Normal. There are bilateral posterior communicating arteries (p-comm) that partially supply the PCAs. ANTERIOR CIRCULATION: --Intracranial internal carotid arteries: Ectatic appearance of the terminal portion of the left internal carotid artery, at the origin of the left MCA. There are heterogeneous flow related enhancement characteristics. Normal right ICA. --Anterior cerebral arteries (ACA): Normal. Both A1 segments are present. Patent anterior communicating artery (a-comm). --Middle cerebral arteries (MCA): Normal. IMPRESSION: 1. No occlusion or hemodynamically significant stenosis by NASCET criteria. 2. 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 CTA of the head is recommended. Electronically Signed   By: Ulyses Jarred M.D.   On: 12/15/2019 03:30   MR ANGIO NECK WO CONTRAST  Result Date: 12/15/2019 CLINICAL DATA:  Stroke follow-up EXAM: MRA NECK WITHOUT CONTRAST MRA HEAD WITHOUT CONTRAST TECHNIQUE: Angiographic images of the neck and circle of Willis were obtained using MRA technique without intravenous contrast. COMPARISON:  None. FINDINGS: MRA NECK FINDINGS Normal 3 vessel branching pattern of the aorta. Vertebral arteries are left-dominant. Both vertebral arteries are normal to the skull base. The carotid systems are normal. MRA HEAD FINDINGS POSTERIOR CIRCULATION: --Vertebral arteries: Normal V4 segments. --Inferior cerebellar arteries: Normal. --Basilar artery: Normal. --Superior cerebellar arteries: Normal. --Posterior cerebral arteries: Normal. There are bilateral posterior communicating arteries (p-comm) that partially supply the PCAs. ANTERIOR CIRCULATION: --Intracranial internal carotid arteries: Ectatic appearance of the terminal portion of the left internal carotid artery, at the origin of the left MCA. There are heterogeneous flow related enhancement characteristics. Normal right ICA. --Anterior cerebral arteries (ACA): Normal. Both A1 segments are present. Patent anterior communicating artery (a-comm). --Middle cerebral arteries (MCA): Normal. IMPRESSION: 1. No occlusion or hemodynamically significant stenosis by NASCET criteria. 2. 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 CTA of the head is recommended. Electronically Signed   By: Ulyses Jarred M.D.   On: 12/15/2019 03:30   MR BRAIN WO CONTRAST  Result Date: 12/14/2019 CLINICAL DATA:  Dizziness EXAM: MRI HEAD WITHOUT CONTRAST TECHNIQUE: Multiplanar, multiecho pulse sequences of the brain and surrounding structures were obtained without intravenous contrast. COMPARISON:   None. FINDINGS: Brain: There is an acute infarct of the ventral left pons. Mild periventricular white matter hyperintensity. Mild generalized volume loss. Midline structures are normal. No chronic microhemorrhage. Vascular: Normal flow voids. Skull and upper cervical spine: Normal Sinuses/Orbits: Bilateral ocular lens replacements. Sinuses are clear. 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 No hemorrhage or mass effect. Electronically Signed   By: Ulyses Jarred M.D.   On: 12/14/2019 23:00    Principal Problem:   Acute CVA (  cerebrovascular accident) [facility] Active Problems:   Type 2 diabetes mellitus with hyperglycemia ([place])   Essential hypertension   Anemia due to blood loss, acute   Acute GI bleeding    Tye Savoy, NP-C @  12/15/2019, 9:50 AM

## 2019-12-15 NOTE — Consult Note (Addendum)
Referring Provider:  [facility]         Primary Care Physician:  Eulas Post, MD Primary Gastroenterologist: none We were asked to see this patient for:   GI bleed               ASSESSMENT /  PLAN     # Dark, loose heme + stools --Not actively bleeding. Had several dark, loose stools last Thursday - Friday. No BMs since.  --  Rule out upper GI bleed. BUN normal. Need to consider right sided diverticular hemorrhage. Had similar presentation in 2013. EGD negative. Blood throughout colon with poor prep.  --Needs EGD but probably repeat colonoscopy as well given upcoming need for anti-platelet therapy. I have reached out to Neurology about how soon they want to start CVA treatment and whether patient is safe to undergo sedation as these things will dictate when we can proceed with procedures.  --Agree with BID IV PPI --From GI standpoint he could have clear liquids but will hold off ordering until admitting team / Neuro has cleared swallowing   # Acute blood loss anemia.  --Hgb down several grams compared to 2019 ( no interval labs). --Will obtain post transfusion H+H .  # Acute CVA --work up pending  # AKI Cr 1.8                                                                                                                                HPI:    Chief Complaint:  Dark stools / anemia  Carl Lam is a 74 y.o. male with pmh significant for but not limited to DM2, HTN, hyperlipidemia, CVA ( this admit), diverticulosis, abdominal hernia repair. We evaluated him in the hospital in 2013 for GI bleeding. EGD unrevealing. Colonoscopy remarkable for diverticulosis. Bleeding scan negative. The GI bleed was a presumed right sided diverticular hemorrhage. Patient had no bleeding after that until now. This past Thursday ( 5 days ago) he had several episodes of dark loose stools. By Friday his stools were clearing up and he hasn't had a BM since. No associated abdominal pain  or N/V. The only NSAID he takes is Aleve as needed but averages less than 3-4 doses a month.   Patient seen by PCP yesterday. He felt "loopy", had difficulty with gait and speech. Sent to ED. MRI of brain remarkable for acute infarct of ventral left pons. He will eventually need anti-platelet therapy per Neurology. In ED his hgb was 8.7, down from 15 in 2019 ( no interval labs to compare). Today hgb is 7.9. He has received a unit of blood this am.     PREVIOUS ENDOSCOPIC EVALUATIONS / GI STUDIES :  June 2013 Colonoscopy for hematochezia --poor prep --blood throughout colon. Presumed right sided diverticular hemorrhage  June 2013 EGD for hematochezia --small hiatal hernia  Past Medical History:  Diagnosis Date  . Acute lower GI bleeding 10/11/11  .  DEGENERATIVE JOINT DISEASE 10/27/2008   Qualifier: Diagnosis of  By: Elease Hashimoto MD, Bruce    . DIAB W/O COMP TYPE II/UNS NOT STATED UNCNTRL 10/27/2008   Qualifier: Diagnosis of  By: Valma Cava LPN, Izora Gala    . HYPERLIPIDEMIA 04/27/2009   Qualifier: Diagnosis of  By: Elease Hashimoto MD, Bruce    . HYPERTENSION 10/27/2008   Qualifier: Diagnosis of  By: Valma Cava LPN, Izora Gala      Past Surgical History:  Procedure Laterality Date  . COLONOSCOPY  10/12/2011   Procedure: COLONOSCOPY;  Surgeon: Lafayette Dragon, MD;  Location: [facility] ENDOSCOPY;  Service: Endoscopy;  Laterality: N/A;  . ESOPHAGOGASTRODUODENOSCOPY  10/12/2011   Procedure: ESOPHAGOGASTRODUODENOSCOPY (EGD);  Surgeon: Lafayette Dragon, MD;  Location: [facility] ENDOSCOPY;  Service: Endoscopy;  Laterality: N/A;  . HERNIA REPAIR  2007   "navel"  . JOINT REPLACEMENT    . KNEE CARTILAGE SURGERY  1960's and 1970's   left; "2 total"  . TOTAL HIP ARTHROPLASTY  2007    Prior to Admission medications   Medication Sig Start Date End Date Taking? Authorizing Provider  atorvastatin (LIPITOR) 20 MG tablet Take 1 tablet (20 mg total) by mouth daily. 01/16/19  Yes Burchette, Alinda Sierras, MD  gabapentin (NEURONTIN) 100 MG capsule  Take 2 capsules (200 mg total) by mouth 2 (two) times daily. 01/16/19  Yes Burchette, Alinda Sierras, MD  Insulin Glargine (BASAGLAR KWIKPEN) 100 UNIT/ML INJECT 30 UNITS SUBCUTANEOUSLY AT BEDTIME Patient taking differently: Inject 25 Units into the skin daily.  10/26/19  Yes Burchette, Alinda Sierras, MD  lisinopril (ZESTRIL) 20 MG tablet Take 1 tablet (20 mg total) by mouth daily. 11/20/19  Yes Burchette, Alinda Sierras, MD  metFORMIN (GLUCOPHAGE) 500 MG tablet Take two tablets twice daily. Patient taking differently: Take 1,000 mg by mouth 2 (two) times daily with a meal. Take two tablets twice daily. 04/20/19  Yes Burchette, Alinda Sierras, MD    Current Facility-Administered Medications  Medication Dose Route Frequency Provider Last Rate Last Admin  . 0.9 %  sodium chloride infusion   Intravenous Continuous Rise Patience, MD 75 mL/hr at 12/15/19 0122 New Bag at 12/15/19 0122  . acetaminophen (TYLENOL) tablet 650 mg  650 mg Oral Q4H PRN Rise Patience, MD       Or  . acetaminophen (TYLENOL) 160 MG/5ML solution 650 mg  650 mg Per Tube Q4H PRN Rise Patience, MD       Or  . acetaminophen (TYLENOL) suppository 650 mg  650 mg Rectal Q4H PRN Rise Patience, MD      . hydrALAZINE (APRESOLINE) injection 10 mg  10 mg Intravenous Q4H PRN Rise Patience, MD      . insulin aspart (novoLOG) injection 0-6 Units  0-6 Units Subcutaneous Q4H Rise Patience, MD      . insulin glargine (LANTUS) injection 10 Units  10 Units Subcutaneous Q2200 Rise Patience, MD      . pantoprazole (PROTONIX) injection 40 mg  40 mg Intravenous Q12H Rise Patience, MD      . sodium chloride flush (NS) 0.9 % injection 3 mL  3 mL Intravenous Once Rise Patience, MD       Current Outpatient Medications  Medication Sig Dispense Refill  . atorvastatin (LIPITOR) 20 MG tablet Take 1 tablet (20 mg total) by mouth daily. 90 tablet 3  . gabapentin (NEURONTIN) 100 MG capsule Take 2 capsules (200 mg total) by mouth 2  (two) times daily. 360 capsule 3  .  Insulin Glargine (BASAGLAR KWIKPEN) 100 UNIT/ML INJECT 30 UNITS SUBCUTANEOUSLY AT BEDTIME (Patient taking differently: Inject 25 Units into the skin daily. ) 15 mL 0  . lisinopril (ZESTRIL) 20 MG tablet Take 1 tablet (20 mg total) by mouth daily. 90 tablet 3  . metFORMIN (GLUCOPHAGE) 500 MG tablet Take two tablets twice daily. (Patient taking differently: Take 1,000 mg by mouth 2 (two) times daily with a meal. Take two tablets twice daily.) 360 tablet 3    Allergies as of 12/14/2019  . (No Known Allergies)    Family History  Problem Relation Age of Onset  . Alcohol abuse Other        grandparent  . Arthritis Other        arthritis  . Diabetes Other        grandparent    Social History   Socioeconomic History  . Marital status: Married    Spouse name: Not on file  . Number of children: Not on file  . Years of education: Not on file  . Highest education level: Not on file  Occupational History  . Not on file  Tobacco Use  . Smoking status: Former Smoker    Packs/day: 0.30    Years: 15.00    Pack years: 4.50    Types: Cigarettes    Quit date: 10/11/1978    Years since quitting: 41.2  . Smokeless tobacco: Never Used  Vaping Use  . Vaping Use: Never used  Substance and Sexual Activity  . Alcohol use: Yes    Comment: 10/11/11 "last alcohol 27-28 years ago"  . Drug use: Yes    Types: Marijuana    Comment: "recreational marijuana in [employer]; 1970's"  . Sexual activity: Not Currently  Other Topics Concern  . Not on file  Social History Narrative  . Not on file   Social Determinants of Health   Financial Resource Strain:   . Difficulty of Paying Living Expenses: Not on file  Food Insecurity:   . Worried About Charity fundraiser in the Last Year: Not on file  . Ran Out of Food in the Last Year: Not on file  Transportation Needs:   . Lack of Transportation (Medical): Not on file  . Lack of Transportation (Non-Medical): Not on file    Physical Activity:   . Days of Exercise per Week: Not on file  . Minutes of Exercise per Session: Not on file  Stress:   . Feeling of Stress : Not on file  Social Connections:   . Frequency of Communication with Friends and Family: Not on file  . Frequency of Social Gatherings with Friends and Family: Not on file  . Attends Religious Services: Not on file  . Active Member of Clubs or Organizations: Not on file  . Attends Archivist Meetings: Not on file  . Marital Status: Not on file  Intimate Partner Violence:   . Fear of Current or Ex-Partner: Not on file  . Emotionally Abused: Not on file  . Physically Abused: Not on file  . Sexually Abused: Not on file    Review of Systems: All systems reviewed and negative except where noted in HPI.  Physical Exam: Vital signs in last 24 hours: Temp:  [97.6 F (36.4 C)-98.9 F (37.2 C)] 97.7 F (36.5 C) (08/31 0903) Pulse Rate:  [74-88] 77 (08/31 0903) Resp:  [14-21] 18 (08/31 0903) BP: (104-169)/(58-101) 143/70 (08/31 0903) SpO2:  [98 %-100 %] 99 % (08/31 0903) Weight:  [105.2  kg-107 kg] 105.2 kg (08/30 1743)   General:   Alert, well-developed,  male in NAD Psych:  Pleasant, cooperative. Normal mood and affect. Eyes:  Pupils equal, sclera clear, no icterus.   Conjunctiva pink. Ears:  Normal auditory acuity. Nose:  No deformity, discharge,  or lesions. Neck:  Supple; no masses Lungs:  Clear throughout to auscultation.   No wheezes, crackles, or rhonchi.  Heart:  Regular rate and rhythm; no lower extremity edema Abdomen:  Soft, non-distended, nontender, BS active, no palp mass   Rectal:  No stool or blood in vaultt  Msk:  Symmetrical without gross deformities. . Neurologic:  Alert and  oriented x4 Skin:  Intact without significant lesions or rashes.  Scheduled inpatient medications . insulin aspart  0-6 Units Subcutaneous Q4H  . insulin glargine  10 Units Subcutaneous Q2200  . pantoprazole (PROTONIX) IV  40 mg  Intravenous Q12H  . sodium chloride flush  3 mL Intravenous Once      Intake/Output from previous day: 08/30 0701 - 08/31 0700 In: 500 [IV Piggyback:500] Out: -  Intake/Output this shift: No intake/output data recorded.  Lab Results: Recent Labs    12/14/19 1822 12/14/19 1825 12/15/19 0258  WBC 5.9  --  7.3  HGB 8.7* 8.8* 7.9*  HCT 27.0* 26.0* 24.9*  PLT 243  --  228   BMET Recent Labs    12/14/19 1822 12/14/19 1825  NA 141 144  K 4.4 4.4  CL 109 107  CO2 26  --   GLUCOSE 196* 182*  BUN 14 16  CREATININE 1.78* 1.80*  CALCIUM 9.2  --    LFT Recent Labs    12/14/19 1822  PROT 6.8  ALBUMIN 3.8  AST 13*  ALT 11  ALKPHOS 43  BILITOT 0.3   PT/INR Recent Labs    12/14/19 1822  LABPROT 12.8  INR 1.0   Hepatitis Panel No results for input(s): HEPBSAG, HCVAB, HEPAIGM, HEPBIGM in the last 72 hours.   . CBC Latest Ref Rng & Units 12/15/2019 12/14/2019 12/14/2019  WBC 4.0 - 10.5 K/uL 7.3 - 5.9  Hemoglobin 13.0 - 17.0 g/dL 7.9(L) 8.8(L) 8.7(L)  Hematocrit 39 - 52 % 24.9(L) 26.0(L) 27.0(L)  Platelets 150 - 400 K/uL 228 - 243    . CMP Latest Ref Rng & Units 12/14/2019 12/14/2019 11/20/2019  Glucose 70 - 99 mg/dL 182(H) 196(H) 96  BUN 8 - 23 mg/dL 16 14 22   Creatinine 0.61 - 1.24 mg/dL 1.80(H) 1.78(H) 1.44(H)  Sodium 135 - 145 mmol/L 144 141 142  Potassium 3.5 - 5.1 mmol/L 4.4 4.4 4.6  Chloride 98 - 111 mmol/L 107 109 107  CO2 22 - 32 mmol/L - 26 26  Calcium 8.9 - 10.3 mg/dL - 9.2 9.3  Total Protein 6.5 - 8.1 g/dL - 6.8 7.2  Total Bilirubin 0.3 - 1.2 mg/dL - 0.3 0.6  Alkaline Phos 38 - 126 U/L - 43 -  AST 15 - 41 U/L - 13(L) 12  ALT 0 - 44 U/L - 11 10   Studies/Results: CT HEAD WO CONTRAST  Result Date: 12/14/2019 CLINICAL DATA:  Slurred speech since 7 a.m., abnormal gait, last known well sign a morning EXAM: CT HEAD WITHOUT CONTRAST TECHNIQUE: Contiguous axial images were obtained from the base of the skull through the vertex without intravenous  contrast. COMPARISON:  None. FINDINGS: Brain: No acute infarct or hemorrhage. Lateral ventricles and midline structures are unremarkable. No acute extra-axial fluid collections. No mass effect. Vascular: Significant atherosclerosis of  the internal carotid arteries. No hyperdense vessel. Skull: Normal. Negative for fracture or focal lesion. Sinuses/Orbits: Mild mucosal thickening within the anterior ethmoid air cells. Remaining paranasal sinuses are clear. Other: None. IMPRESSION: 1. No acute intracranial process. Electronically Signed   By: Randa Ngo M.D.   On: 12/14/2019 19:03   MR ANGIO HEAD WO CONTRAST  Result Date: 12/15/2019 CLINICAL DATA:  Stroke follow-up EXAM: MRA NECK WITHOUT CONTRAST MRA HEAD WITHOUT CONTRAST TECHNIQUE: Angiographic images of the neck and circle of Willis were obtained using MRA technique without intravenous contrast. COMPARISON:  None. FINDINGS: MRA NECK FINDINGS Normal 3 vessel branching pattern of the aorta. Vertebral arteries are left-dominant. Both vertebral arteries are normal to the skull base. The carotid systems are normal. MRA HEAD FINDINGS POSTERIOR CIRCULATION: --Vertebral arteries: Normal V4 segments. --Inferior cerebellar arteries: Normal. --Basilar artery: Normal. --Superior cerebellar arteries: Normal. --Posterior cerebral arteries: Normal. There are bilateral posterior communicating arteries (p-comm) that partially supply the PCAs. ANTERIOR CIRCULATION: --Intracranial internal carotid arteries: Ectatic appearance of the terminal portion of the left internal carotid artery, at the origin of the left MCA. There are heterogeneous flow related enhancement characteristics. Normal right ICA. --Anterior cerebral arteries (ACA): Normal. Both A1 segments are present. Patent anterior communicating artery (a-comm). --Middle cerebral arteries (MCA): Normal. IMPRESSION: 1. No occlusion or hemodynamically significant stenosis by NASCET criteria. 2. 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 CTA of the head is recommended. Electronically Signed   By: Ulyses Jarred M.D.   On: 12/15/2019 03:30   MR ANGIO NECK WO CONTRAST  Result Date: 12/15/2019 CLINICAL DATA:  Stroke follow-up EXAM: MRA NECK WITHOUT CONTRAST MRA HEAD WITHOUT CONTRAST TECHNIQUE: Angiographic images of the neck and circle of Willis were obtained using MRA technique without intravenous contrast. COMPARISON:  None. FINDINGS: MRA NECK FINDINGS Normal 3 vessel branching pattern of the aorta. Vertebral arteries are left-dominant. Both vertebral arteries are normal to the skull base. The carotid systems are normal. MRA HEAD FINDINGS POSTERIOR CIRCULATION: --Vertebral arteries: Normal V4 segments. --Inferior cerebellar arteries: Normal. --Basilar artery: Normal. --Superior cerebellar arteries: Normal. --Posterior cerebral arteries: Normal. There are bilateral posterior communicating arteries (p-comm) that partially supply the PCAs. ANTERIOR CIRCULATION: --Intracranial internal carotid arteries: Ectatic appearance of the terminal portion of the left internal carotid artery, at the origin of the left MCA. There are heterogeneous flow related enhancement characteristics. Normal right ICA. --Anterior cerebral arteries (ACA): Normal. Both A1 segments are present. Patent anterior communicating artery (a-comm). --Middle cerebral arteries (MCA): Normal. IMPRESSION: 1. No occlusion or hemodynamically significant stenosis by NASCET criteria. 2. 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 CTA of the head is recommended. Electronically Signed   By: Ulyses Jarred M.D.   On: 12/15/2019 03:30   MR BRAIN WO CONTRAST  Result Date: 12/14/2019 CLINICAL DATA:  Dizziness EXAM: MRI HEAD WITHOUT CONTRAST TECHNIQUE: Multiplanar, multiecho pulse sequences of the brain and surrounding structures were obtained without intravenous contrast. COMPARISON:   None. FINDINGS: Brain: There is an acute infarct of the ventral left pons. Mild periventricular white matter hyperintensity. Mild generalized volume loss. Midline structures are normal. No chronic microhemorrhage. Vascular: Normal flow voids. Skull and upper cervical spine: Normal Sinuses/Orbits: Bilateral ocular lens replacements. Sinuses are clear. 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 No hemorrhage or mass effect. Electronically Signed   By: Ulyses Jarred M.D.   On: 12/14/2019 23:00    Principal Problem:   Acute CVA (  cerebrovascular accident) [facility]) Active Problems:   Type 2 diabetes mellitus with hyperglycemia ([place])   Essential hypertension   Anemia due to blood loss, acute   Acute GI bleeding    Tye Savoy, NP-C @  12/15/2019, 9:50 AM

## 2019-12-15 NOTE — Progress Notes (Signed)
PT Cancellation Note  Patient Details Name: Carl Lam MRN: [id number] DOB: 09/06/1945   Cancelled Treatment:    Reason Eval/Treat Not Completed: Patient at procedure or test/unavailable Pt currently out of room for procedure. Will follow up as schedule allows.   Lou Miner, DPT  Acute Rehabilitation Services  Pager: [phone] Office: [phone]    Rudean Hitt 12/15/2019, 11:23 AM

## 2019-12-15 NOTE — ED Notes (Signed)
2RNs have attempted 2nd IV without success. Blood work obtained and sent. Pt taken to MRI

## 2019-12-15 NOTE — Progress Notes (Signed)
CSW received consult for patient for possible marijuana use. CSW spoke with patient to determine if he is currently using any marijuana, he states he hasn't in many years. Patient was joyful and pleasant, but adamantly denied any marijuana use.  CSW encouraged patient to reach out if questions or concerns arise while he is in the hospital, he stated agreement.  Madilyn Fireman, MSW, LCSW-A Transitions of Care  Clinical Social Worker  [facility] Emergency Departments  Medical ICU [phone]

## 2019-12-15 NOTE — ED Notes (Signed)
Taken for ECHO

## 2019-12-16 ENCOUNTER — Inpatient Hospital Stay (HOSPITAL_COMMUNITY): Payer: Medicare Other | Admitting: Anesthesiology

## 2019-12-16 ENCOUNTER — Encounter (HOSPITAL_COMMUNITY)
Admission: EM | Disposition: A | Discharge status: Home Health | Payer: Self-pay | Source: Home / Self Care | Attending: Family Medicine

## 2019-12-16 ENCOUNTER — Encounter (HOSPITAL_COMMUNITY): Payer: Self-pay | Admitting: Internal Medicine

## 2019-12-16 DIAGNOSIS — D124 Benign neoplasm of descending colon: Secondary | ICD-10-CM

## 2019-12-16 DIAGNOSIS — K635 Polyp of colon: Secondary | ICD-10-CM

## 2019-12-16 DIAGNOSIS — R195 Other fecal abnormalities: Secondary | ICD-10-CM

## 2019-12-16 DIAGNOSIS — K621 Rectal polyp: Secondary | ICD-10-CM

## 2019-12-16 DIAGNOSIS — D128 Benign neoplasm of rectum: Secondary | ICD-10-CM

## 2019-12-16 DIAGNOSIS — Z9289 Personal history of other medical treatment: Secondary | ICD-10-CM

## 2019-12-16 HISTORY — PX: COLONOSCOPY WITH PROPOFOL: SHX5780

## 2019-12-16 HISTORY — PX: ESOPHAGOGASTRODUODENOSCOPY (EGD) WITH PROPOFOL: SHX5813

## 2019-12-16 HISTORY — DX: Personal history of other medical treatment: Z92.89

## 2019-12-16 HISTORY — PX: HEMOSTASIS CLIP PLACEMENT: SHX6857

## 2019-12-16 HISTORY — PX: POLYPECTOMY: SHX5525

## 2019-12-16 LAB — GLUCOSE, CAPILLARY
Glucose-Capillary: 103 mg/dL — ABNORMAL HIGH (ref 70–99)
Glucose-Capillary: 112 mg/dL — ABNORMAL HIGH (ref 70–99)
Glucose-Capillary: 112 mg/dL — ABNORMAL HIGH (ref 70–99)
Glucose-Capillary: 197 mg/dL — ABNORMAL HIGH (ref 70–99)
Glucose-Capillary: 266 mg/dL — ABNORMAL HIGH (ref 70–99)
Glucose-Capillary: 93 mg/dL (ref 70–99)

## 2019-12-16 SURGERY — COLONOSCOPY WITH PROPOFOL
Anesthesia: Monitor Anesthesia Care

## 2019-12-16 MED ORDER — DEXMEDETOMIDINE HCL 200 MCG/2ML IV SOLN
INTRAVENOUS | Status: DC | PRN
  Administered 2019-12-16: 8 ug via INTRAVENOUS

## 2019-12-16 MED ORDER — LIDOCAINE HCL (CARDIAC) PF 100 MG/5ML IV SOSY
PREFILLED_SYRINGE | INTRAVENOUS | Status: DC | PRN
  Administered 2019-12-16: 60 mg via INTRAVENOUS

## 2019-12-16 MED ORDER — PANTOPRAZOLE SODIUM 40 MG PO TBEC
40.0000 mg | DELAYED_RELEASE_TABLET | Freq: Every day | ORAL | Status: DC
  Administered 2019-12-16 – 2019-12-17 (×2): 40 mg via ORAL
  Filled 2019-12-16 (×2): qty 1

## 2019-12-16 MED ORDER — LACTATED RINGERS IV SOLN: INTRAVENOUS | Status: DC | PRN

## 2019-12-16 MED ORDER — PROPOFOL 500 MG/50ML IV EMUL
INTRAVENOUS | Status: DC | PRN
  Administered 2019-12-16: 125 ug/kg/min via INTRAVENOUS
  Administered 2019-12-16: 75 ug/kg/min via INTRAVENOUS

## 2019-12-16 MED ORDER — PHENYLEPHRINE 40 MCG/ML (10ML) SYRINGE FOR IV PUSH (FOR BLOOD PRESSURE SUPPORT)
PREFILLED_SYRINGE | INTRAVENOUS | Status: DC | PRN
  Administered 2019-12-16: 60 ug via INTRAVENOUS

## 2019-12-16 MED ORDER — PROPOFOL 10 MG/ML IV BOLUS
INTRAVENOUS | Status: DC | PRN
  Administered 2019-12-16: 15 mg via INTRAVENOUS
  Administered 2019-12-16: 25 mg via INTRAVENOUS
  Administered 2019-12-16: 15 mg via INTRAVENOUS

## 2019-12-16 SURGICAL SUPPLY — 25 items

## 2019-12-16 NOTE — Progress Notes (Signed)
SLP Cancellation Note  Patient Details Name: Carl Lam MRN: [id number] DOB: 17-Mar-1946   Cancelled treatment:       Reason Eval/Treat Not Completed: Patient at procedure or test/unavailable. Per RN, pt leaving for procedures. Will continue efforts.  Theresa Dohrman B. Quentin Ore, [facility], [place] Speech Language Pathologist Office: [phone]  Shonna Chock 12/16/2019, 8:57 AM

## 2019-12-16 NOTE — Evaluation (Addendum)
Speech Language Pathology Evaluation Patient Details Name: Carl Lam MRN: [id number] DOB: April 29, 1945 Today's Date: 12/16/2019 Time: 1335-1400 SLP Time Calculation (min) (ACUTE ONLY): 25 min  Problem List:  Patient Active Problem List   Diagnosis Date Noted  . Occult blood in stools   . Benign neoplasm of descending colon   . Benign neoplasm of rectum   . Acute CVA (cerebrovascular accident) ([place]) 12/14/2019  . Acute GI bleeding 12/14/2019  . Paresthesias 09/02/2018  . Paronychia 01/19/2012  . Diverticulosis 10/19/2011  . Anemia due to blood loss, acute 10/12/2011  . GI bleed 10/11/2011  . Hyperlipidemia 04/27/2009  . Type 2 diabetes mellitus with hyperglycemia ([place]) 10/27/2008  . Essential hypertension 10/27/2008  . DEGENERATIVE JOINT DISEASE 10/27/2008   Past Medical History:  Past Medical History:  Diagnosis Date  . Acute lower GI bleeding 10/11/11  . DEGENERATIVE JOINT DISEASE 10/27/2008   Qualifier: Diagnosis of  By: Elease Hashimoto MD, Bruce    . DIAB W/O COMP TYPE II/UNS NOT STATED UNCNTRL 10/27/2008   Qualifier: Diagnosis of  By: Valma Cava LPN, Izora Gala    . HYPERLIPIDEMIA 04/27/2009   Qualifier: Diagnosis of  By: Elease Hashimoto MD, Bruce    . HYPERTENSION 10/27/2008   Qualifier: Diagnosis of  By: Valma Cava LPN, Izora Gala     Past Surgical History:  Past Surgical History:  Procedure Laterality Date  . COLONOSCOPY  10/12/2011   Procedure: COLONOSCOPY;  Surgeon: Lafayette Dragon, MD;  Location[facility] ENDOSCOPY;  Service: Endoscopy;  Laterality: N/A;  . ESOPHAGOGASTRODUODENOSCOPY  10/12/2011   Procedure: ESOPHAGOGASTRODUODENOSCOPY (EGD);  Surgeon: Lafayette Dragon, MD;  Location: [facility] ENDOSCOPY;  Service: Endoscopy;  Laterality: N/A;  . HERNIA REPAIR  2007   "navel"  . JOINT REPLACEMENT    . KNEE CARTILAGE SURGERY  1960's and 1970's   left; "2 total"  . TOTAL HIP ARTHROPLASTY  2007   HPI:  74yo male admitted 12/14/19 with weakness and gait disturbance. PMH: HTN, DM, HLD, CKD2. MRI = Acute  infarct of the ventral left pons.   Assessment / Plan / Recommendation Clinical Impression  Pt was resting in bed, wife present. He reports having 2 years of college, retired from [employer], left handed. The [place] Mental Status (SLUMS) Examination was administered. Pt scored 26/30, indicating slight neurocognitive deficits. Points (4) were lost on auditory attention and paragraph recall. PT evaluation noted decreased awareness of deficits - supervision recommended at DC for safety.  Pt/wife were encouraged to notify PCP if difficulties arise upon return to normal routines. No further acute ST intervention recommended. Thank you for this referral.    SLP Assessment  SLP Recommendation/Assessment: All further Speech Language Pathology needs can be addressed in the next venue of care (if needs arise.) SLP Visit Diagnosis: Cognitive communication deficit (R41.841)    Follow Up Recommendations   Supervision at home (pt was encouraged to notify PCP if difficulties arise.)       SLP Evaluation Cognition  Overall Cognitive Status: Within Functional Limits for tasks assessed Arousal/Alertness: Awake/alert       Comprehension  Auditory Comprehension Overall Auditory Comprehension: Appears within functional limits for tasks assessed    Expression Expression Primary Mode of Expression: Verbal Verbal Expression Overall Verbal Expression: Appears within functional limits for tasks assessed Written Expression Dominant Hand: Left   Oral / Motor  Oral Motor/Sensory Function Overall Oral Motor/Sensory Function: Within functional limits Motor Speech Overall Motor Speech: Appears within functional limits for tasks assessed   GO  Betzalel Umbarger B. Quentin Ore, [facility], [place] Speech Language Pathologist Office: [phone]  Shonna Chock 12/16/2019, 2:04 PM

## 2019-12-16 NOTE — Evaluation (Signed)
Physical Therapy Evaluation Patient Details Name: Carl Lam MRN: [id number] DOB: 1945/10/14 Today's Date: 12/16/2019   History of Present Illness  GAHEL SAFLEY is a 74 y.o. male with history of hypertension, diabetes, hyperlipidemia, chronic kidney disease stage II was referred to the ER by patient's primary care physician after patient was found to be increasingly weak and difficult to walk with gait disturbances. 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    Clinical Impression  Pt admitted with above. Pt presenting with R LE weakness as noted with R knee buckling during ambulation. Pt at increased falls risk and reports "Im not walking like I used to". Pt functioning at min guard assist however requires increased assist with onset of fatigue. Recommending HHPT to aide in increasing R LE strength and improve balance to decrease falls risk. Acute PT to cont to follow.    Follow Up Recommendations Home health PT;Supervision/Assistance - 24 hour    Equipment Recommendations   (uses his cane at home)    Recommendations for Other Services       Precautions / Restrictions Precautions Precautions: Fall Precaution Comments: R knee weakness/buckling Restrictions Weight Bearing Restrictions: No      Mobility  Bed Mobility Overal bed mobility: Modified Independent             General bed mobility comments: pt brought self up to long sit and brought legs over EOB, mild labored effort, discussed log rolling to aide in minimizing labored effort  Transfers Overall transfer level: Needs assistance Equipment used: None Transfers: Sit to/from Stand Sit to Stand: Min guard         General transfer comment: min guard to steady, increased time to power up but no physical assist  Ambulation/Gait Ambulation/Gait assistance: Min assist Gait Distance (Feet): 200 Feet Assistive device: IV Pole;None Gait Pattern/deviations: Step-through pattern;Decreased stride length Gait velocity:  dec Gait velocity interpretation: <1.8 ft/sec, indicate of risk for recurrent falls General Gait Details: pt with noted R knee buckling presenting as antalgic gait however pt denies pain in R LE. Pt with worsening R knee buckling with onset of fatigue/increased ambulation deficits. Pt given IV Pole to push in L hand, pt with less antaglia and R knee buckling. encouraged pt to use cane in L hand until R LE became stronger and there wasn't knee buckling  Stairs Stairs: Yes Stairs assistance: Min guard;Min assist Stair Management: One rail Right;Step to pattern;Sideways;Forwards Number of Stairs: 2 (x2 trials) General stair comments: pt attempted to ascend with R hand on R HR however reuqiring minA to power up safely. Pt instructed on sideways approach with bilat hands on R HR, pt min guard, more steady and less antalgia, pt agreed  Wheelchair Mobility    Modified Rankin (Stroke Patients Only) Modified Rankin (Stroke Patients Only) Pre-Morbid Rankin Score: No significant disability Modified Rankin: Moderate disability     Balance Overall balance assessment: Needs assistance Sitting-balance support: Feet supported;No upper extremity supported Sitting balance-Leahy Scale: Good     Standing balance support: Single extremity supported Standing balance-Leahy Scale: Fair Standing balance comment: R LE weakness                             Pertinent Vitals/Pain Pain Assessment: No/denies pain    Home Living Family/patient expects to be discharged to:: Private residence Living Arrangements: Spouse/significant other Available Help at Discharge: Family;Available 24 hours/day (wife works from home) Type of Home: House Home Access:  Stairs to enter Entrance Stairs-Rails: Right Entrance Stairs-Number of Steps: 8 Home Layout: Multi-level (bedroom on 2nd floor, 1/2 bath on main floor) Home Equipment: Cane - single point Additional Comments: pt was walking 5 min on treadmill/day,  and stationary bike    Prior Function Level of Independence: Independent         Comments: was exercising daily, drove     Hand Dominance   Dominant Hand: Right    Extremity/Trunk Assessment   Upper Extremity Assessment Upper Extremity Assessment: Overall WFL for tasks assessed    Lower Extremity Assessment Lower Extremity Assessment: RLE deficits/detail RLE Deficits / Details: grossly 3+/5    Cervical / Trunk Assessment Cervical / Trunk Assessment: Normal  Communication   Communication: No difficulties  Cognition Arousal/Alertness: Awake/alert Behavior During Therapy: WFL for tasks assessed/performed Overall Cognitive Status: Impaired/Different from baseline Area of Impairment: Safety/judgement                         Safety/Judgement: Decreased awareness of deficits     General Comments: pt with decreased insight to R LE weakness, reports "I think I'll just walk on my treadmill instead of using a cane"      General Comments General comments (skin integrity, edema, etc.): VSS    Exercises     Assessment/Plan    PT Assessment Patient needs continued PT services  PT Problem List Decreased strength;Decreased range of motion;Decreased activity tolerance;Decreased balance;Decreased mobility;Decreased coordination;Decreased cognition;Decreased knowledge of use of DME;Decreased safety awareness       PT Treatment Interventions DME instruction;Gait training;Stair training;Functional mobility training;Therapeutic activities;Therapeutic exercise;Balance training;Neuromuscular re-education    PT Goals (Current goals can be found in the Care Plan section)  Acute Rehab PT Goals Patient Stated Goal: home PT Goal Formulation: With patient/family Time For Goal Achievement: 12/30/19 Potential to Achieve Goals: Good    Frequency Min 4X/week   Barriers to discharge        Co-evaluation               AM-PAC PT "6 Clicks" Mobility  Outcome Measure  Help needed turning from your back to your side while in a flat bed without using bedrails?: None Help needed moving from lying on your back to sitting on the side of a flat bed without using bedrails?: None Help needed moving to and from a bed to a chair (including a wheelchair)?: None Help needed standing up from a chair using your arms (e.g., wheelchair or bedside chair)?: None Help needed to walk in hospital room?: A Little Help needed climbing 3-5 steps with a railing? : A Little 6 Click Score: 22    End of Session Equipment Utilized During Treatment: Gait belt Activity Tolerance: Patient tolerated treatment well Patient left: in bed;with call bell/phone within reach;with family/visitor present (sitting EOB) Nurse Communication: Mobility status PT Visit Diagnosis: Difficulty in walking, not elsewhere classified (R26.2)    Time: 7371-0626 PT Time Calculation (min) (ACUTE ONLY): 22 min   Charges:   PT Evaluation $PT Eval Moderate Complexity: 1 Mod          Kittie Plater, PT, DPT Acute Rehabilitation Services Pager #: [phone] Office #: [phone]   Berline Lopes 12/16/2019, 10:13 AM

## 2019-12-16 NOTE — Interval H&P Note (Signed)
History and Physical Interval Note:  12/16/2019 10:00 AM  Carl Lam  has presented today for surgery, with the diagnosis of gastrointestinal bleeding.  The various methods of treatment have been discussed with the patient and family. After consideration of risks, benefits and other options for treatment, the patient has consented to  Procedure(s): COLONOSCOPY WITH PROPOFOL (N/A) ESOPHAGOGASTRODUODENOSCOPY (EGD) WITH PROPOFOL (N/A) as a surgical intervention.  The patient's history has been reviewed, patient examined, no change in status, stable for surgery.  I have reviewed the patient's chart and labs.  Questions were answered to the patient's satisfaction.     Pricilla Riffle. Fuller Plan

## 2019-12-16 NOTE — Progress Notes (Signed)
[facility]  PROGRESS NOTE  Carl Lam EGB:[id number] DOB: 1945-10-07 DOA: 12/14/2019 PCP: Eulas Post, MD   Brief HPI:   74 year old male with history of hypertension, diabetes mellitus type 2, hyperlipidemia, stage II CKD was referred by PCP for generalized weakness difficulty walking and gait disturbance.  Patient found to have right-sided weakness.  CT head was unremarkable.  MRI brain was ordered for evaluation of stroke.  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  He was incidentally found to have hemoglobin between 7-8, baseline hemoglobin was around 15 in 2019.  Gastroenterology was consulted for further evaluation of guaiac positive stool and anemia.    Subjective   Patient seen and examined, underwent EGD and colonoscopy today which did not show active bleed.   Assessment/Plan:     1. Acute CVA of left pons-neurology was consulted, patient underwent echocardiogram which showed grade 1 diastolic dysfunction, no evidence of apical thrombus, MRA head and neck showed no large vessel occlusion.  Patient has not been started on aspirin Plavix due to GI bleed.  Gastroenterology recommend starting antiplatelet therapy from tomorrow.  Patient's LDL is 59 2. GI bleed-patient's hemoglobin has dropped from 15 to 8 g/dL in the last 2 years.  GI was consulted for EGD and colonoscopy.  EGD was unremarkable and colonoscopy showed diverticulosis in the left and right colon.  No active bleeding noted.  GI recommends that patient should have repeat colonoscopy in 3 years. 3. Diabetes mellitus type 2-continue sliding scale insulin NovoLog, hemoglobin A1c 6.0.      COVID-19 Labs  No results for input(s): DDIMER, FERRITIN, LDH, CRP in the last 72 hours.  Lab Results  Component Value Date   [place] NEGATIVE 12/14/2019     Scheduled medications:   . insulin aspart  0-6 Units Subcutaneous Q4H  . insulin glargine  10 Units Subcutaneous Q2200  . pantoprazole   40 mg Oral Q0600  . sodium chloride flush  3 mL Intravenous Once         CBG: Recent Labs  Lab 12/15/19 2325 12/16/19 0336 12/16/19 0737 12/16/19 1152 12/16/19 1644  GLUCAP 105* 112* 103* 93 112*    SpO2: 98 %    CBC: Recent Labs  Lab 12/14/19 1822 12/14/19 1825 12/15/19 0258 12/15/19 1411  WBC 5.9  --  7.3 7.4  NEUTROABS 3.7  --   --   --   HGB 8.7* 8.8* 7.9* 8.6*  HCT 27.0* 26.0* 24.9* 26.7*  MCV 94.1  --  94.7 93.4  PLT 243  --  228 371    Basic Metabolic Panel: Recent Labs  Lab 12/14/19 1822 12/14/19 1825  NA 141 144  K 4.4 4.4  CL 109 107  CO2 26  --   GLUCOSE 196* 182*  BUN 14 16  CREATININE 1.78* 1.80*  CALCIUM 9.2  --      Liver Function Tests: Recent Labs  Lab 12/14/19 1822  AST 13*  ALT 11  ALKPHOS 43  BILITOT 0.3  PROT 6.8  ALBUMIN 3.8     Antibiotics: Anti-infectives (From admission, onward)   None       DVT prophylaxis: SCDs  Code Status: Full code  Family Communication: Discussed with patient wife at bedside    Status is: Inpatient  Dispo: The patient is from: Home              Anticipated d/c is to: Home  Anticipated d/c date is: 12/17/2019              Patient currently not medically stable for discharge  Barrier to discharge-ongoing PT evaluation, final neurology recommendations          Consultants:  Neurology  Gastroenterology  Procedures:  EGD  Colonoscopy   Objective   Vitals:   12/16/19 1114 12/16/19 1124 12/16/19 1154 12/16/19 1646  BP: 110/72 114/77 130/86 (!) 141/73  Pulse: 73 73 69 86  Resp: 14 13 16 18   Temp:   97.6 F (36.4 C) 98 F (36.7 C)  TempSrc:   Oral Oral  SpO2: 99% 100% 100% 98%  Weight:        Intake/Output Summary (Last 24 hours) at 12/16/2019 1715 Last data filed at 12/16/2019 1100 Gross per 24 hour  Intake 1793.47 ml  Output --  Net 1793.47 ml    08/30 1901 - 09/01 0700 In: 1593.5 [I.V.:1093.5] Out: -   Filed Weights   12/14/19 1743   Weight: 105.2 kg    Physical Examination:    General: Appears in no acute distress  Cardiovascular: S1-S2, regular, no murmur auscultated  Respiratory: Clear to auscultation bilaterally, no wheezing or crackles auscultated  Abdomen: Soft, nontender, no organomegaly  Extremities: No edema in the lower extremities  Neurologic: Alert, oriented x3, intact insight and judgment    Data Reviewed:   Recent Results (from the past 240 hour(s))  SARS Coronavirus 2 by RT PCR (hospital order, performed in [place] hospital lab) Nasopharyngeal Nasopharyngeal Swab     Status: None   Collection Time: 12/14/19 10:17 PM   Specimen: Nasopharyngeal Swab  Result Value Ref Range Status   SARS Coronavirus 2 NEGATIVE NEGATIVE Final    Comment: (NOTE) SARS-CoV-2 target nucleic acids are NOT DETECTED.  The SARS-CoV-2 RNA is generally detectable in upper and lower respiratory specimens during the acute phase of infection. The lowest concentration of SARS-CoV-2 viral copies this assay can detect is 250 copies / mL. A negative result does not preclude SARS-CoV-2 infection and should not be used as the sole basis for treatment or other patient management decisions.  A negative result may occur with improper specimen collection / handling, submission of specimen other than nasopharyngeal swab, presence of viral mutation(s) within the areas targeted by this assay, and inadequate number of viral copies (<250 copies / mL). A negative result must be combined with clinical observations, patient history, and epidemiological information.  Fact Sheet for Patients:   StrictlyIdeas.no  Fact Sheet for Healthcare Providers: BankingDealers.co.za  This test is not yet approved or  cleared by the [place] FDA and has been authorized for detection and/or diagnosis of SARS-CoV-2 by FDA under an Emergency Use Authorization (EUA).  This EUA will remain in  effect (meaning this test can be used) for the duration of the COVID-19 declaration under Section 564(b)(1) of the Act, 21 U.S.C. section 360bbb-3(b)(1), unless the authorization is terminated or revoked sooner.  Performed at [facility], [place] [address], [place], [place] [address]     No results for input(s): LIPASE, AMYLASE in the last 168 hours. No results for input(s): AMMONIA in the last 168 hours.  Cardiac Enzymes: No results for input(s): CKTOTAL, CKMB, CKMBINDEX, TROPONINI in the last 168 hours. BNP (last 3 results) No results for input(s): BNP in the last 8760 hours.  ProBNP (last 3 results) No results for input(s): PROBNP in the last 8760 hours.  Studies:  CT HEAD WO CONTRAST  Result Date: 12/14/2019 CLINICAL DATA:  Slurred speech since 7 a.m., abnormal gait, last known well sign a morning EXAM: CT HEAD WITHOUT CONTRAST TECHNIQUE: Contiguous axial images were obtained from the base of the skull through the vertex without intravenous contrast. COMPARISON:  None. FINDINGS: Brain: No acute infarct or hemorrhage. Lateral ventricles and midline structures are unremarkable. No acute extra-axial fluid collections. No mass effect. Vascular: Significant atherosclerosis of the internal carotid arteries. No hyperdense vessel. Skull: Normal. Negative for fracture or focal lesion. Sinuses/Orbits: Mild mucosal thickening within the anterior ethmoid air cells. Remaining paranasal sinuses are clear. Other: None. IMPRESSION: 1. No acute intracranial process. Electronically Signed   By: Randa Ngo M.D.   On: 12/14/2019 19:03   MR ANGIO HEAD WO CONTRAST  Result Date: 12/15/2019 CLINICAL DATA:  Stroke follow-up EXAM: MRA NECK WITHOUT CONTRAST MRA HEAD WITHOUT CONTRAST TECHNIQUE: Angiographic images of the neck and circle of Willis were obtained using MRA technique without intravenous contrast. COMPARISON:  None. FINDINGS: MRA NECK FINDINGS Normal 3 vessel branching pattern of the aorta.  Vertebral arteries are left-dominant. Both vertebral arteries are normal to the skull base. The carotid systems are normal. MRA HEAD FINDINGS POSTERIOR CIRCULATION: --Vertebral arteries: Normal V4 segments. --Inferior cerebellar arteries: Normal. --Basilar artery: Normal. --Superior cerebellar arteries: Normal. --Posterior cerebral arteries: Normal. There are bilateral posterior communicating arteries (p-comm) that partially supply the PCAs. ANTERIOR CIRCULATION: --Intracranial internal carotid arteries: Ectatic appearance of the terminal portion of the left internal carotid artery, at the origin of the left MCA. There are heterogeneous flow related enhancement characteristics. Normal right ICA. --Anterior cerebral arteries (ACA): Normal. Both A1 segments are present. Patent anterior communicating artery (a-comm). --Middle cerebral arteries (MCA): Normal. IMPRESSION: 1. No occlusion or hemodynamically significant stenosis by NASCET criteria. 2. 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 CTA of the head is recommended. Electronically Signed   By: Ulyses Jarred M.D.   On: 12/15/2019 03:30   MR ANGIO NECK WO CONTRAST  Result Date: 12/15/2019 CLINICAL DATA:  Stroke follow-up EXAM: MRA NECK WITHOUT CONTRAST MRA HEAD WITHOUT CONTRAST TECHNIQUE: Angiographic images of the neck and circle of Willis were obtained using MRA technique without intravenous contrast. COMPARISON:  None. FINDINGS: MRA NECK FINDINGS Normal 3 vessel branching pattern of the aorta. Vertebral arteries are left-dominant. Both vertebral arteries are normal to the skull base. The carotid systems are normal. MRA HEAD FINDINGS POSTERIOR CIRCULATION: --Vertebral arteries: Normal V4 segments. --Inferior cerebellar arteries: Normal. --Basilar artery: Normal. --Superior cerebellar arteries: Normal. --Posterior cerebral arteries: Normal. There are bilateral posterior communicating arteries (p-comm) that  partially supply the PCAs. ANTERIOR CIRCULATION: --Intracranial internal carotid arteries: Ectatic appearance of the terminal portion of the left internal carotid artery, at the origin of the left MCA. There are heterogeneous flow related enhancement characteristics. Normal right ICA. --Anterior cerebral arteries (ACA): Normal. Both A1 segments are present. Patent anterior communicating artery (a-comm). --Middle cerebral arteries (MCA): Normal. IMPRESSION: 1. No occlusion or hemodynamically significant stenosis by NASCET criteria. 2. 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 CTA of the head is recommended. Electronically Signed   By: Ulyses Jarred M.D.   On: 12/15/2019 03:30   MR BRAIN WO CONTRAST  Result Date: 12/14/2019 CLINICAL DATA:  Dizziness EXAM: MRI HEAD WITHOUT CONTRAST TECHNIQUE: Multiplanar, multiecho pulse sequences of the brain and surrounding structures were obtained without intravenous contrast. COMPARISON:  None. FINDINGS: Brain: There is an acute infarct of the ventral left pons. Mild  periventricular white matter hyperintensity. Mild generalized volume loss. Midline structures are normal. No chronic microhemorrhage. Vascular: Normal flow voids. Skull and upper cervical spine: Normal Sinuses/Orbits: Bilateral ocular lens replacements. Sinuses are clear. 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 No hemorrhage or mass effect. Electronically Signed   By: Ulyses Jarred M.D.   On: 12/14/2019 23:00   ECHOCARDIOGRAM COMPLETE  Result Date: 12/15/2019    ECHOCARDIOGRAM REPORT   Patient Name:   Carl Lam Date of Exam: 12/15/2019 Medical Rec #:  [id number]         Height:       73.0 in Accession #:    [id number]        Weight:       232.0 lb Date of Birth:  1945-09-11         BSA:          2.292 m Patient Age:    80 years          BP:           127/83 mmHg Patient Gender: M                 HR:           83 bpm. Exam Location:   Inpatient Procedure: 2D Echo, Cardiac Doppler and Color Doppler Indications:    Stroke 434.91 / I163.9  History:        Patient has no prior history of Echocardiogram examinations.                 Risk Factors:Hypertension, Diabetes, Dyslipidemia and Former                 Smoker.  Sonographer:    Vickie Epley RDCS Referring Phys: [place]  1. Left ventricular ejection fraction, by estimation, is 60 to 65%. The left ventricle has normal function. The left ventricle has no regional wall motion abnormalities. Left ventricular diastolic parameters are consistent with Grade I diastolic dysfunction (impaired relaxation).  2. Right ventricular systolic function is normal. The right ventricular size is normal. Tricuspid regurgitation signal is inadequate for assessing PA pressure.  3. The mitral valve is normal in structure. No evidence of mitral valve regurgitation. No evidence of mitral stenosis.  4. The aortic valve is tricuspid. Aortic valve regurgitation is not visualized. No aortic stenosis is present.  5. Aortic dilatation noted. There is mild dilatation of the aortic root measuring 38 mm.  6. The inferior vena cava is normal in size with greater than 50% respiratory variability, suggesting right atrial pressure of 3 mmHg. FINDINGS  Left Ventricle: Left ventricular ejection fraction, by estimation, is 60 to 65%. The left ventricle has normal function. The left ventricle has no regional wall motion abnormalities. The left ventricular internal cavity size was normal in size. There is  no left ventricular hypertrophy. Left ventricular diastolic parameters are consistent with Grade I diastolic dysfunction (impaired relaxation). Right Ventricle: The right ventricular size is normal. No increase in right ventricular wall thickness. Right ventricular systolic function is normal. Tricuspid regurgitation signal is inadequate for assessing PA pressure. Left Atrium: Left atrial size was normal in  size. Right Atrium: Right atrial size was normal in size. Pericardium: There is no evidence of pericardial effusion. Mitral Valve: The mitral valve is normal in structure. No evidence of mitral valve regurgitation. No evidence of mitral valve stenosis. Tricuspid Valve: The tricuspid valve is normal in structure. Tricuspid valve regurgitation is  not demonstrated. Aortic Valve: The aortic valve is tricuspid. Aortic valve regurgitation is not visualized. No aortic stenosis is present. Pulmonic Valve: The pulmonic valve was normal in structure. Pulmonic valve regurgitation is not visualized. Aorta: Aortic dilatation noted. There is mild dilatation of the aortic root measuring 38 mm. Venous: The inferior vena cava is normal in size with greater than 50% respiratory variability, suggesting right atrial pressure of 3 mmHg. IAS/Shunts: No atrial level shunt detected by color flow Doppler.  LEFT VENTRICLE PLAX 2D LVIDd:         4.70 cm      Diastology LVIDs:         3.10 cm      LV e' lateral:   6.64 cm/s LV PW:         0.80 cm      LV E/e' lateral: 7.3 LV IVS:        0.80 cm      LV e' medial:    4.57 cm/s LVOT diam:     2.60 cm      LV E/e' medial:  10.7 LV SV:         89 LV SV Index:   39 LVOT Area:     5.31 cm  LV Volumes (MOD) LV vol d, MOD A2C: 83.5 ml LV vol d, MOD A4C: 100.0 ml LV vol s, MOD A2C: 40.2 ml LV vol s, MOD A4C: 46.7 ml LV SV MOD A2C:     43.3 ml LV SV MOD A4C:     100.0 ml LV SV MOD BP:      50.1 ml RIGHT VENTRICLE RV S prime:     11.30 cm/s TAPSE (M-mode): 1.7 cm LEFT ATRIUM             Index       RIGHT ATRIUM          Index LA diam:        4.20 cm 1.83 cm/m  RA Area:     9.16 cm LA Vol (A2C):   33.7 ml 14.71 ml/m RA Volume:   19.60 ml 8.55 ml/m LA Vol (A4C):   33.6 ml 14.66 ml/m LA Biplane Vol: 34.0 ml 14.84 ml/m  AORTIC VALVE LVOT Vmax:   82.90 cm/s LVOT Vmean:  56.300 cm/s LVOT VTI:    0.168 m  AORTA Ao Root diam: 3.80 cm MITRAL VALVE MV Area (PHT): 3.42 cm    SHUNTS MV Decel Time: 222 msec     Systemic VTI:  0.17 m MV E velocity: 48.80 cm/s  Systemic Diam: 2.60 cm MV A velocity: 77.10 cm/s MV E/A ratio:  0.63 Loralie Champagne MD Electronically signed by Loralie Champagne MD Signature Date/Time: 12/15/2019/4:22:38 PM    Final        Oswald Hillock   [facility] If 7PM-7AM, please contact night-coverage at [email or web address], Office  [phone]   12/16/2019, 5:15 PM  LOS: 2 days

## 2019-12-16 NOTE — Anesthesia Procedure Notes (Signed)
Procedure Name: MAC Date/Time: 12/16/2019 10:03 AM Performed by: Mariea Clonts, CRNA Pre-anesthesia Checklist: Patient identified, Suction available, Emergency Drugs available, Patient being monitored and Timeout performed Patient Re-evaluated:Patient Re-evaluated prior to induction Oxygen Delivery Method: Nasal cannula

## 2019-12-16 NOTE — Evaluation (Signed)
Occupational Therapy Evaluation Patient Details Name: Carl Lam MRN: [id number] DOB: 19-Apr-1945 Today's Date: 12/16/2019    History of Present Illness Carl Lam is a 74 y.o. male with history of hypertension, diabetes, hyperlipidemia, chronic kidney disease stage II was referred to the ER by patient's primary care physician after patient was found to be increasingly weak and difficult to walk with gait disturbances. 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   Clinical Impression   PTA patient independent with ADLs, mobility, driving. Admitted for above and limited by problem list below, including mild R sided weakness/ decreased coordination, impaired balance, and decreased awareness to deficits/safety.  Patient currently requires min guard for transfers and in room mobility given hand held support to simulate use of cane, min guard for grooming at sink standing, min assist for LB ADLs and setup assist for UB ADLs. His spouse is present and is able to provide 24/7 supervision initially.  Patient requires increased education on deficits seen in R UE, as patient reports this is not his dominant UE; but then reports he feels a little off, not 100%.  He was educated on exercises for R UE (finger isolation extension, tip to tip with speed and shoulder flexion) and to use UE as much as possible.  Believe he will benefit from further OT services while admitted and after dc at [facility] level (pending progress) to optimize independence and return to PLOF.  Will follow.    Follow Up Recommendations  Home health OT;Supervision/Assistance - 24 hour (inital 24/7)    Equipment Recommendations  None recommended by OT    Recommendations for Other Services       Precautions / Restrictions Precautions Precautions: Fall Precaution Comments: R knee weakness/buckling Restrictions Weight Bearing Restrictions: No      Mobility Bed Mobility Overal bed mobility: Modified Independent              General bed mobility comments: to/from EOB without assist   Transfers Overall transfer level: Needs assistance Equipment used: None Transfers: Sit to/from Stand Sit to Stand: Min guard         General transfer comment: min guard to steady, increased time to power up but no physical assist    Balance Overall balance assessment: Needs assistance Sitting-balance support: Feet supported;No upper extremity supported Sitting balance-Leahy Scale: Good     Standing balance support: No upper extremity supported;During functional activity;Single extremity supported Standing balance-Leahy Scale: Fair Standing balance comment: relies on 1 UE support dynamically but able to engage in grooming tasks with min guard and 0 hand support                            ADL either performed or assessed with clinical judgement   ADL Overall ADL's : Needs assistance/impaired     Grooming: Min guard;Standing;Wash/dry hands   Upper Body Bathing: Set up;Sitting   Lower Body Bathing: Min guard;Sit to/from stand   Upper Body Dressing : Set up;Sitting   Lower Body Dressing: Minimal assistance;Sit to/from stand   Toilet Transfer: Min guard;Ambulation Toilet Transfer Details (indicate cue type and reason): simulated in room, 1 HHA          Functional mobility during ADLs: Min guard General ADL Comments: pt limited by mild R sided weakness, decreased awareness to deficits, balance      Vision Baseline Vision/History: Wears glasses Wears Glasses: Reading only Patient Visual Report: No change from baseline Vision Assessment?: No  apparent visual deficits     Perception     Praxis      Pertinent Vitals/Pain Pain Assessment: No/denies pain     Hand Dominance Left   Extremity/Trunk Assessment Upper Extremity Assessment Upper Extremity Assessment: RUE deficits/detail RUE Deficits / Details: grossly 4/5 MMT, mild dysmetric and decreased [place]  RUE Sensation: WNL RUE Coordination:  decreased fine motor;decreased gross motor   Lower Extremity Assessment Lower Extremity Assessment: Defer to PT evaluation   Cervical / Trunk Assessment Cervical / Trunk Assessment: Normal   Communication Communication Communication: No difficulties   Cognition Arousal/Alertness: Awake/alert Behavior During Therapy: WFL for tasks assessed/performed Overall Cognitive Status: Impaired/Different from baseline Area of Impairment: Safety/judgement;Awareness;Problem solving                         Safety/Judgement: Decreased awareness of deficits Awareness: Emergent Problem Solving: Requires verbal cues General Comments: patient with decreased insight to weakness/mild decreased coordination in R UE   General Comments  VSS, spouse present and supportive     Exercises     Shoulder Instructions      Home Living Family/patient expects to be discharged to:: Private residence Living Arrangements: Spouse/significant other Available Help at Discharge: Family;Available 24 hours/day Type of Home: House Home Access: Stairs to enter [employer] of Steps: 8 Entrance Stairs-Rails: Right Home Layout: Multi-level;1/2 bath on main level Alternate Level Stairs-Number of Steps: flight Alternate Level Stairs-Rails: Right Bathroom Shower/Tub: Occupational psychologist: Handicapped height     Home Equipment: [place] - single point   Additional Comments: pt was walking 5 min on treadmill/day, and stationary bike  Lives With: Spouse    Prior Functioning/Environment Level of Independence: Independent        Comments: was exercising daily; driving, not working; independent IADLs/med mgmt         OT Problem List: Decreased strength;Decreased activity tolerance;Impaired balance (sitting and/or standing);Decreased coordination;Decreased safety awareness;Decreased knowledge of use of DME or AE;Decreased knowledge of precautions      OT Treatment/Interventions:  Self-care/ADL training;DME and/or AE instruction;Neuromuscular education;Therapeutic activities;Cognitive remediation/compensation;Patient/family education;Balance training    OT Goals(Current goals can be found in the care plan section) Acute Rehab OT Goals Patient Stated Goal: home OT Goal Formulation: With patient Time For Goal Achievement: 12/30/19 Potential to Achieve Goals: Good  OT Frequency: Min 2X/week   Barriers to D/C:            Co-evaluation              AM-PAC OT "6 Clicks" Daily Activity     Outcome Measure Help from another person eating meals?: None Help from another person taking care of personal grooming?: A Little Help from another person toileting, which includes using toliet, bedpan, or urinal?: A Little Help from another person bathing (including washing, rinsing, drying)?: A Little Help from another person to put on and taking off regular upper body clothing?: A Little Help from another person to put on and taking off regular lower body clothing?: A Little 6 Click Score: 19   End of Session Equipment Utilized During Treatment: Gait belt Nurse Communication: Mobility status  Activity Tolerance: Patient tolerated treatment well Patient left: in bed;with call bell/phone within reach;with bed alarm set;with family/visitor present;with SCD's reapplied  OT Visit Diagnosis: Other abnormalities of gait and mobility (R26.89);Muscle weakness (generalized) (M62.81);Other symptoms and signs involving the nervous system (R29.898)  Time: 6742-5525 OT Time Calculation (min): 24 min Charges:  OT General Charges $OT Visit: 1 Visit OT Evaluation $OT Eval Moderate Complexity: 1 Mod OT Treatments $Self Care/Home Management : 8-22 mins  Jolaine Artist, OT Acute Rehabilitation Services Pager [phone] Office [phone]   Delight Stare 12/16/2019, 2:25 PM

## 2019-12-16 NOTE — Progress Notes (Signed)
STROKE TEAM PROGRESS NOTE        INTERVAL HISTORY Patient wife is at the bedside.  He went for both upper and lower endoscopy earlier today no definite source of bleeding was found.  He was found to have 2 polyps which were removed.  Patient states he has had no further bleeding.  He feels his strength is improving.  He has no complaints today.   OBJECTIVE Vitals:   12/16/19 1114 12/16/19 1124 12/16/19 1154 12/16/19 1646  BP: 110/72 114/77 130/86 (!) 141/73  Pulse: 73 73 69 86  Resp: 14 13 16 18   Temp:   97.6 F (36.4 C) 98 F (36.7 C)  TempSrc:   Oral Oral  SpO2: 99% 100% 100% 98%  Weight:        CBC:  Recent Labs  Lab 12/14/19 1822 12/14/19 1825 12/15/19 0258 12/15/19 1411  WBC 5.9   < > 7.3 7.4  NEUTROABS 3.7  --   --   --   HGB 8.7*   < > 7.9* 8.6*  HCT 27.0*   < > 24.9* 26.7*  MCV 94.1   < > 94.7 93.4  PLT 243   < > 228 214   < > = values in this interval not displayed.    Basic Metabolic Panel:  Recent Labs  Lab 12/14/19 1822 12/14/19 1825  NA 141 144  K 4.4 4.4  CL 109 107  CO2 26  --   GLUCOSE 196* 182*  BUN 14 16  CREATININE 1.78* 1.80*  CALCIUM 9.2  --     IMAGING  CT HEAD WO CONTRAST 12/14/2019 IMPRESSION:  No acute intracranial process.   MR ANGIO HEAD WO CONTRAST MR ANGIO NECK WO CONTRAST 12/15/2019 IMPRESSION:  1. No occlusion or hemodynamically significant stenosis by NASCET criteria.  2. 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 CTA of the head is recommended.   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 No hemorrhage or mass effect.   Transthoracic Echocardiogram  Left ventricular ejection fraction, by estimation, is 60 to 65%  Bilateral Carotid Dopplers  Not needed. See MRA neck  ECG - SR rate 85 BPM. (See cardiology reading for complete details)   PHYSICAL EXAM Blood pressure (!) 141/73, pulse 86, temperature 98 F (36.7 C),  temperature source Oral, resp. rate 18, weight 105.2 kg, SpO2 98 %. Pleasant elderly african [place] male not in distress. . Afebrile. Head is nontraumatic. Neck is supple without bruit.    Cardiac exam no murmur or gallop. Lungs are clear to auscultation. Distal pulses are well felt.  Neurological Exam ;  Awake  Alert oriented x 3. Normal speech and language.eye movements full without nystagmus.fundi were not visualized. Vision acuity and fields appear normal. Hearing is normal. Palatal movements are normal. Face symmetric. Tongue midline. Mild right hemiparesis 4/5 with weakness right grip and diminished fine finger movements on right and orbits left over right upper extremity.. Normal sensation. Gait deferred.      ASSESSMENT/PLAN [name] is a 74 y.o. male with history of hypertension, hyperlipidemia, diabetes, hx of Gi bleed (recent dark and tarry stool possibly 2/2 blueberry consumption) who presents with unsteadiness that has been present to some degree since Thursday, worse on day of admission. He states that he felt off balance on Thursday.  He also notes a dark and tarry stool that occurred on Thursday, he felt that he had eaten too  many blueberries. He did not receive IV t-PA due to late presentation (>4.5 hours from time of onset).   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 Patient also having acute GI bleed  Resultant  Mild right hemiparesis  Code Stroke CT Head - not ordered      CT head - No acute intracranial process.   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  MRA head - No occlusion or hemodynamically significant stenosis by NASCET criteria. 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 CTA of the head is recommended. creatinine - 1.80   CTA H&N - not ordered  CT Perfusion - not ordered  Carotid Doppler - not ordered. See MRA neck  2D Echo - normal  EF.  Sars Corona Virus 2 - negative  LDL - 59  HgbA1c - 6.0  UDS - not ordered  VTE prophylaxis - SCDs Diet  Diet Order            DIET SOFT Room service appropriate? Yes; Fluid consistency: Thin  Diet effective now                 No antithrombotic prior to admission, now on No antithrombotic (aspirin being held 2/2 to possible Gi bleeding)  Patient will be counseled to be compliant with his antithrombotic medications  Ongoing aggressive stroke risk factor management  Therapy recommendations:  pending  Disposition:  Pending  Hypertension  Home BP meds: Zestril  Current BP meds: hydralazine prn  Stable . Permissive hypertension (OK if < 220/120) but gradually normalize in 5-7 days . Long-term BP goal normotensive  Hyperlipidemia  Home Lipid lowering medication: Lipitor 20 mg daily  LDL pending, goal < 70  Current lipid lowering medication: none / NPO   Continue statin at discharge  Diabetes  Home diabetic meds: metformin and insulin  Current diabetic meds: SSI   HgbA1c pending, goal < 7.0 Recent Labs    12/16/19 0737 12/16/19 1152 12/16/19 1644  GLUCAP 103* 93 112*    Other Stroke Risk Factors  Advanced age  Former cigarette smoker - quit   Obesity, Body mass index is 30.61 kg/m., recommend weight loss, diet and exercise as appropriate   Substance Abuse - hx of marijuana use   Other Active Problems  Code status - Full code  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 CTA of the head is recommended. (creatinine - 1.80) CKD - stage 3b - creatinine - 1.80  Anemia - Hgb - 7.9 Possible Gi bleeding - aspirin being held until cleared by Gi.  Hospital day # 2  He presented with right hemiparesis secondary to pontine infarct likely from small vessel disease due to lower GI bleeding possibly from diverticulitis which appears to have stopped.  Gastroenterologist has cleared him to start  antiplatelet therapy starting tomorrow.  I would recommend coated 81 mg aspirin alone given history of recent GI bleed starting tomorrow.  Maintain aggressive risk factor modification.  I had a long discussion with patient and wife and answered questions.d/w Dr Darrick Meigs I have spent a total of  25  minutes with the patient reviewing hospital notes,  test results, labs and examining the patient as well as establishing an assessment and plan of care about his stroke and GI bleeding that was discussed personally with the patient.  > 50% of time was spent in direct patient care.  Stroke team  will sign off.  Kindly call for questions discussed with patient and wife and Dr. Higinio Plan, MD       To contact Stroke Continuity provider, please refer to [email or web address] After hours, contact General Neurology

## 2019-12-16 NOTE — Op Note (Addendum)
[facility] Patient Name: Carl Lam Procedure Date : 12/16/2019 MRN: [id number] Attending MD: Ladene Artist , MD Date of Birth: 05-Jan-1946 CSN: [id number] Age: 74 Admit Type: Inpatient Procedure:                Colonoscopy Indications:              Heme positive stool, Acute post hemorrhagic anemia Providers:                Pricilla Riffle. Fuller Plan, MD, Clyde Lundborg, RN, Cherylynn Ridges, Technician, Virgilio Belling. Huel Cote, CRNA Referring MD:             [facility] Medicines:                Monitored Anesthesia Care Complications:            No immediate complications. Estimated blood loss:                            None. Estimated Blood Loss:     Estimated blood loss: none. Procedure:                Pre-Anesthesia Assessment:                           - Prior to the procedure, a History and Physical                            was performed, and patient medications and                            allergies were reviewed. The patient's tolerance of                            previous anesthesia was also reviewed. The risks                            and benefits of the procedure and the sedation                            options and risks were discussed with the patient.                            All questions were answered, and informed consent                            was obtained. Prior Anticoagulants: The patient has                            taken no previous anticoagulant or antiplatelet                            agents. ASA Grade Assessment: III - A patient with  severe systemic disease. After reviewing the risks                            and benefits, the patient was deemed in                            satisfactory condition to undergo the procedure.                           After obtaining informed consent, the colonoscope                            was passed under direct vision. Throughout the                             procedure, the patient's blood pressure, pulse, and                            oxygen saturations were monitored continuously. The                            CF-HQ190L ([id number] Olympus colonoscope was                            introduced through the anus and advanced to the the                            cecum, identified by appendiceal orifice and                            ileocecal valve. The ileocecal valve, appendiceal                            orifice, and rectum were photographed. The quality                            of the bowel preparation was adequate after                            extensive lavage and suction. The colonoscopy was                            performed without difficulty. The patient tolerated                            the procedure well. Scope In: 10:14:11 AM Scope Out: 10:45:10 AM Total Procedure Duration: 0 hours 30 minutes 59 seconds  Findings:      The perianal and digital rectal examinations were normal.      A few medium-mouthed diverticula were found in the right colon. There       was no evidence of diverticular bleeding.      Multiple medium-mouthed diverticula were found in the left colon. There       was narrowing of the colon in association with the diverticular opening.  There was evidence of diverticular spasm. There was no evidence of       diverticular bleeding.      A 8 mm polyp was found in the descending colon. The polyp was sessile.       The polyp was removed with a cold snare. Resection and retrieval were       complete.      A 10 mm polyp was found in the rectum. The polyp was sessile. The polyp       was removed with a cold snare. Resection and retrieval were complete.       Persistent oozing at polypectomy site. For hemostasis, two hemostatic       clips were successfully placed (MR conditional). There was no bleeding       at the end of the procedure. No blood noted in the colon prior to rectal       polypectomy.       Internal hemorrhoids were found during retroflexion. The hemorrhoids       were small and Grade I (internal hemorrhoids that do not prolapse).      The exam was otherwise without abnormality on direct and retroflexion       views. Impression:               - Mild diverticulosis in the right colon. There was                            no evidence of diverticular bleeding.                           - Moderate diverticulosis in the left colon. There                            was no evidence of diverticular bleeding.                           - One 8 mm polyp in the descending colon, removed                            with a cold snare. Resected and retrieved.                           - One 10 mm polyp in the rectum, removed with a                            cold snare. Resected and retrieved. Persistent                            oozing at polypectomy site. Clips (MR conditional)                            were placed.                           - Small internal hemorrhoid.                           - No blood noted in the  colon prior to rectal                            polypectomy.                           - The examination was otherwise normal on direct                            and retroflexion views. Recommendation:           - Repeat colonoscopy, likely 3 years, after studies                            are complete for surveillance based on pathology                            results with Dr. Havery Moros.                           - Patient has a contact number available for                            emergencies. The signs and symptoms of potential                            delayed complications were discussed with the                            patient. Return to normal activities tomorrow.                            Written discharge instructions were provided to the                            patient.                           - Resume previous diet.                            - Continue present medications.                           - OK to start anticoagulants or antiplatelet                            medications tomorrow as indicated.                           - Await pathology results. Procedure Code(s):        --- Professional ---                           [phone], Colonoscopy, flexible; with removal of  tumor(s), polyp(s), or other lesion(s) by snare                            technique Diagnosis Code(s):        --- Professional ---                           K63.5, Polyp of colon                           K62.1, Rectal polyp                           R19.5, Other fecal abnormalities                           D62, Acute posthemorrhagic anemia                           K57.30, Diverticulosis of large intestine without                            perforation or abscess without bleeding CPT copyright 2019 [employer]. All rights reserved. The codes documented in this report are preliminary and upon coder review may  be revised to meet current compliance requirements. Ladene Artist, MD 12/16/2019 11:18:21 AM This report has been signed electronically. Number of Addenda: 0

## 2019-12-16 NOTE — TOC Initial Note (Signed)
Transition of Care [facility]) - Initial/Assessment Note    Patient Details  Name: Carl Lam MRN: [id number] Date of Birth: 05/27/1945  Transition of Care [facility]) CM/SW Contact:    Pollie Friar, RN Phone Number: 12/16/2019, 4:16 PM  Clinical Narrative:                 Pt lives home with spouse that can provide needed supervision as she works from home. Recommendations are for [facility] services. CM provided met with them and [facility] decided on. Britney with [facility] accepted the referral.  Pt denies issues with home transportation and medications. Pt has transport home when medically ready.   Expected Discharge Plan: [place] Barriers to Discharge: Continued Medical Work up   Patient Goals and CMS Choice   CMS Medicare.gov Compare Post Acute Care list provided to:: Patient Choice offered to / list presented to : Patient, Spouse  Expected Discharge Plan and Services Expected Discharge Plan: [place]   Discharge Planning Services: CM Consult Post Acute Care Choice: [place] arrangements for the past 2 months: Single Family Home                           HH Arranged: PT, OT [facility] Agency: [facility] Date [place]: 12/16/19   Representative spoke with at [place]: [place]  Prior Living Arrangements/Services Living arrangements for the past 2 months: [place] Lives with:: Spouse Patient language and need for interpreter reviewed:: Yes Do you feel safe going back to the place where you live?: Yes      Need for Family Participation in Patient Care: Yes (Comment) Care giver support system in place?: Yes (comment) Current home services: DME (cane) Criminal Activity/Legal Involvement Pertinent to Current Situation/Hospitalization: No - Comment as needed  Activities of Daily Living      Permission Sought/Granted                  Emotional Assessment Appearance:: Appears stated age   Affect  (typically observed): Accepting Orientation: : Oriented to Self, Oriented to Place, Oriented to  Time, Oriented to Situation   Psych Involvement: No (comment)  Admission diagnosis:  Weakness [R53.1] Acute CVA (cerebrovascular accident) ([place]) [I63.9] Gastrointestinal hemorrhage, unspecified gastrointestinal hemorrhage type [K92.2] Anemia, unspecified type [D64.9] Patient Active Problem List   Diagnosis Date Noted  . Occult blood in stools   . Benign neoplasm of descending colon   . Benign neoplasm of rectum   . Acute CVA (cerebrovascular accident) ([place]) 12/14/2019  . Acute GI bleeding 12/14/2019  . Paresthesias 09/02/2018  . Paronychia 01/19/2012  . Diverticulosis 10/19/2011  . Anemia due to blood loss, acute 10/12/2011  . GI bleed 10/11/2011  . Hyperlipidemia 04/27/2009  . Type 2 diabetes mellitus with hyperglycemia ([place]) 10/27/2008  . Essential hypertension 10/27/2008  . DEGENERATIVE JOINT DISEASE 10/27/2008   PCP:  Eulas Post, MD Pharmacy:   [facility], [place] [address] Phone: [phone] Fax: [phone]     Social Determinants of Health (SDOH) Interventions    Readmission Risk Interventions No flowsheet data found.

## 2019-12-16 NOTE — Progress Notes (Signed)
OT Cancellation Note  Patient Details Name: Carl Lam MRN: [id number] DOB: 1945/10/31   Cancelled Treatment:    Reason Eval/Treat Not Completed: Patient at procedure or test/ unavailable. Off unit, will follow and see as able.  Jolaine Artist, OT Acute Rehabilitation Services Pager [phone] Office [phone]   Delight Stare 12/16/2019, 9:33 AM

## 2019-12-16 NOTE — Op Note (Signed)
[facility] Patient Name: Carl Lam Procedure Date : 12/16/2019 MRN: [id number] Attending MD: Ladene Artist , MD Date of Birth: May 27, 1945 CSN: [id number] Age: 74 Admit Type: Inpatient Procedure:                Upper GI endoscopy Indications:              Acute post hemorrhagic anemia, Heme positive stool Providers:                Pricilla Riffle. Fuller Plan, MD, Clyde Lundborg, RN, Cherylynn Ridges, Technician, Tamala Fothergill, CRNA Referring MD:             [facility] Medicines:                Monitored Anesthesia Care Complications:            No immediate complications. Estimated Blood Loss:     Estimated blood loss: none. Procedure:                Pre-Anesthesia Assessment:                           - Prior to the procedure, a History and Physical                            was performed, and patient medications and                            allergies were reviewed. The patient's tolerance of                            previous anesthesia was also reviewed. The risks                            and benefits of the procedure and the sedation                            options and risks were discussed with the patient.                            All questions were answered, and informed consent                            was obtained. Prior Anticoagulants: The patient has                            taken no previous anticoagulant or antiplatelet                            agents. ASA Grade Assessment: III - A patient with                            severe systemic disease. After reviewing the risks  and benefits, the patient was deemed in                            satisfactory condition to undergo the procedure.                           - Prior to the procedure, a History and Physical                            was performed, and patient medications and                            allergies were reviewed. The patient's tolerance  of                            previous anesthesia was also reviewed. The risks                            and benefits of the procedure and the sedation                            options and risks were discussed with the patient.                            All questions were answered, and informed consent                            was obtained. Prior Anticoagulants: The patient has                            taken no previous anticoagulant or antiplatelet                            agents. ASA Grade Assessment: III - A patient with                            severe systemic disease. After reviewing the risks                            and benefits, the patient was deemed in                            satisfactory condition to undergo the procedure.                           After obtaining informed consent, the endoscope was                            passed under direct vision. Throughout the                            procedure, the patient's blood pressure, pulse, and  oxygen saturations were monitored continuously. The                            GIF-H190 ([id number] Olympus gastroscope was                            introduced through the mouth, and advanced to the                            second part of duodenum. The upper GI endoscopy was                            accomplished without difficulty. The patient                            tolerated the procedure well. Scope In: Scope Out: Findings:      The examined esophagus was normal.      A small hiatal hernia was present.      The exam of the stomach was otherwise normal.      The duodenal bulb and second portion of the duodenum were normal. Impression:               - Normal esophagus.                           - Small hiatal hernia.                           - Normal duodenal bulb and second portion of the                            duodenum.                           - No specimens  collected. Recommendation:           - Return patient to hospital ward for ongoing care.                           - Resume previous diet.                           - Continue present medications.                           - See colonoscopy report. GI signing off and we are                            available if needed.                           - Outpatient GI follow up with Dr. Havery Moros as                            needed. Procedure Code(s):        --- Professional ---  41583, Esophagogastroduodenoscopy, flexible,                            transoral; diagnostic, including collection of                            specimen(s) by brushing or washing, when performed                            (separate procedure) Diagnosis Code(s):        --- Professional ---                           K44.9, Diaphragmatic hernia without obstruction or                            gangrene                           D62, Acute posthemorrhagic anemia                           R19.5, Other fecal abnormalities CPT copyright 2019 [employer]. All rights reserved. The codes documented in this report are preliminary and upon coder review may  be revised to meet current compliance requirements. Ladene Artist, MD 12/16/2019 11:22:00 AM This report has been signed electronically. Number of Addenda: 0

## 2019-12-16 NOTE — Transfer of Care (Signed)
Immediate Anesthesia Transfer of Care Note  Patient: Carl Lam  Procedure(s) Performed: COLONOSCOPY WITH PROPOFOL (N/A ) ESOPHAGOGASTRODUODENOSCOPY (EGD) WITH PROPOFOL (N/A ) POLYPECTOMY HEMOSTASIS CLIP PLACEMENT  Patient Location: Endoscopy Unit  Anesthesia Type:MAC  Level of Consciousness: awake, alert  and oriented  Airway & Oxygen Therapy: Patient Spontanous Breathing and Patient connected to nasal cannula oxygen  Post-op Assessment: Report given to RN, Post -op Vital signs reviewed and stable and Patient moving all extremities X 4  Post vital signs: Reviewed and stable  Last Vitals:  Vitals Value Taken Time  BP 101/79 12/16/19 1104  Temp    Pulse 79 12/16/19 1107  Resp 15 12/16/19 1107  SpO2 100 % 12/16/19 1107  Vitals shown include unvalidated device data.  Last Pain:  Vitals:   12/16/19 0929  TempSrc: Oral  PainSc: 0-No pain      Patients Stated Pain Goal: 0 (79/48/01 6553)  Complications: No complications documented.

## 2019-12-17 ENCOUNTER — Encounter: Payer: Self-pay | Admitting: Gastroenterology

## 2019-12-17 LAB — BASIC METABOLIC PANEL
Anion gap: 6 (ref 5–15)
BUN: 8 mg/dL (ref 8–23)
CO2: 24 mmol/L (ref 22–32)
Calcium: 8.5 mg/dL — ABNORMAL LOW (ref 8.9–10.3)
Chloride: 112 mmol/L — ABNORMAL HIGH (ref 98–111)
Creatinine, Ser: 1.41 mg/dL — ABNORMAL HIGH (ref 0.61–1.24)
GFR calc Af Amer: 57 mL/min — ABNORMAL LOW (ref >60)
GFR calc non Af Amer: 49 mL/min — ABNORMAL LOW (ref >60)
Glucose, Bld: 220 mg/dL — ABNORMAL HIGH (ref 70–99)
Potassium: 4 mmol/L (ref 3.5–5.1)
Sodium: 142 mmol/L (ref 135–145)

## 2019-12-17 LAB — SURGICAL PATHOLOGY

## 2019-12-17 LAB — HEMOGLOBIN AND HEMATOCRIT, BLOOD
HCT: 26.8 % — ABNORMAL LOW (ref 39.0–52.0)
Hemoglobin: 8.9 g/dL — ABNORMAL LOW (ref 13.0–17.0)

## 2019-12-17 LAB — GLUCOSE, CAPILLARY
Glucose-Capillary: 223 mg/dL — ABNORMAL HIGH (ref 70–99)
Glucose-Capillary: 180 mg/dL — ABNORMAL HIGH (ref 70–99)
Glucose-Capillary: 194 mg/dL — ABNORMAL HIGH (ref 70–99)
Glucose-Capillary: 209 mg/dL — ABNORMAL HIGH (ref 70–99)

## 2019-12-17 LAB — CBC
HCT: 22.2 % — ABNORMAL LOW (ref 39.0–52.0)
Hemoglobin: 7.3 g/dL — ABNORMAL LOW (ref 13.0–17.0)
MCH: 30 pg (ref 26.0–34.0)
MCHC: 32.9 g/dL (ref 30.0–36.0)
MCV: 91.4 fL (ref 80.0–100.0)
Platelets: 218 10*3/uL (ref 150–400)
RBC: 2.43 MIL/uL — ABNORMAL LOW (ref 4.22–5.81)
RDW: 15.7 % — ABNORMAL HIGH (ref 11.5–15.5)
WBC: 5.6 10*3/uL (ref 4.0–10.5)
nRBC: 0 % (ref 0.0–0.2)

## 2019-12-17 LAB — PREPARE RBC (CROSSMATCH)

## 2019-12-17 MED ORDER — ASPIRIN EC 81 MG PO TBEC
81.0000 mg | DELAYED_RELEASE_TABLET | Freq: Every day | ORAL | Status: DC
  Administered 2019-12-17: 81 mg via ORAL
  Filled 2019-12-17: qty 1

## 2019-12-17 MED ORDER — PANTOPRAZOLE SODIUM 40 MG PO TBEC
40.0000 mg | DELAYED_RELEASE_TABLET | Freq: Every day | ORAL | 3 refills | Status: DC
Start: 2019-12-18 — End: 2020-02-22

## 2019-12-17 MED ORDER — SODIUM CHLORIDE 0.9% IV SOLUTION: Freq: Once | INTRAVENOUS | Status: DC

## 2019-12-17 MED ORDER — ASPIRIN 81 MG PO TBEC
81.0000 mg | DELAYED_RELEASE_TABLET | Freq: Every day | ORAL | 11 refills | Status: DC
Start: 2019-12-18 — End: 2020-01-09

## 2019-12-17 NOTE — Discharge Summary (Signed)
Physician Discharge Summary  Carl Lam IPJ:[id number] DOB: July 13, 1945 DOA: 12/14/2019  PCP: Eulas Post, MD  Admit date: 12/14/2019 Discharge date: 12/17/2019  Time spent: 50* minutes  Recommendations for Outpatient Follow-up:  1. Follow-up PCP in 2 weeks 2. Follow-up gastroenterology as outpatient in 2 weeks  Discharge Diagnoses:  Principal Problem:   Acute CVA (cerebrovascular accident) [facility]) Active Problems:   Type 2 diabetes mellitus with hyperglycemia ([place])   Essential hypertension   Anemia due to blood loss, acute   Acute GI bleeding   Occult blood in stools   Benign neoplasm of descending colon   Benign neoplasm of rectum   Discharge Condition: Stable  Diet recommendation: Heart healthy diet  Filed Weights   12/14/19 1743  Weight: 105.2 kg    History of present illness:  74 year old male with history of hypertension, diabetes mellitus type 2, hyperlipidemia, stage II CKD was referred by PCP for generalized weakness difficulty walking and gait disturbance.  Patient found to have right-sided weakness.  CT head was unremarkable.  MRI brain was ordered for evaluation of stroke.  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  He was incidentally found to have hemoglobin between 7-8, baseline hemoglobin was around 15 in 2019.  Gastroenterology was consulted for further evaluation of guaiac positive stool and anemia.  Hospital Course:  1. Acute CVA of left pons-neurology was consulted, patient underwent echocardiogram which showed grade 1 diastolic dysfunction, no evidence of apical thrombus, MRA head and neck showed no large vessel occlusion.  Patient has not been started on aspirin Plavix due to GI bleed.  Gastroenterology recommend starting antiplatelet therapy from tomorrow.  Patient's LDL is 59.  Discussed with Dr. Willaim Rayas, patient will be discharged on aspirin 81 mg enteric-coated daily.  No Plavix will be given due to recent GI bleed. 2. GI  bleed-patient's hemoglobin has dropped from 15 to 8 g/dL in the last 2 years.  GI was consulted for EGD and colonoscopy.  EGD was unremarkable and colonoscopy showed diverticulosis in the left and right colon.  No active bleeding noted.  GI recommends that patient should have repeat colonoscopy in 3 years.  Patient to follow-up with gastroenterology Dr. Havery Moros in 2 weeks.  We will continue Protonix 40 mg p.o. daily. 3. Anemia-due to blood loss as above, hemoglobin is 7.3 today.  Will transfuse 1 unit PRBC before discharge.  Will check H&H posttransfusion. 4. Diabetes mellitus type 2-continue Basaglar and Metformin *  Procedures:  Echocardiogram  Consultations:  Cardiology  Discharge Exam: Vitals:   12/17/19 0931 12/17/19 0954  BP: 131/80 135/83  Pulse: 83 74  Resp: 20   Temp: 98.5 F (36.9 C) 98.6 F (37 C)  SpO2: 100% 100%    General: Appears in no acute distress Cardiovascular: S1-S2, regular Respiratory: Clear to auscultation bilaterally  Discharge Instructions   Discharge Instructions    Diet - low sodium heart healthy   Complete by: As directed    Increase activity slowly   Complete by: As directed      Allergies as of 12/17/2019   No Known Allergies     Medication List    TAKE these medications   aspirin 81 MG EC tablet Take 1 tablet (81 mg total) by mouth daily. Swallow whole. Start taking on: December 18, 2019   atorvastatin 20 MG tablet Commonly known as: LIPITOR Take 1 tablet (20 mg total) by mouth daily.   Basaglar KwikPen 100 UNIT/ML INJECT 30 UNITS SUBCUTANEOUSLY AT BEDTIME  What changed: See the new instructions.   gabapentin 100 MG capsule Commonly known as: NEURONTIN Take 2 capsules (200 mg total) by mouth 2 (two) times daily.   lisinopril 20 MG tablet Commonly known as: ZESTRIL Take 1 tablet (20 mg total) by mouth daily.   metFORMIN 500 MG tablet Commonly known as: GLUCOPHAGE Take two tablets twice daily. What changed:   how  much to take  how to take this  when to take this   pantoprazole 40 MG tablet Commonly known as: PROTONIX Take 1 tablet (40 mg total) by mouth daily at 6 (six) AM. Start taking on: December 18, 2019      No Known Allergies  Follow-up Information    [facility] Follow up.   Specialty: Home Health Services Why: The home health agency will contact you for the first home visit. Contact information: [address] [place] [address] [phone]        Eulas Post, MD Follow up in 2 week(s).   Specialty: Family Medicine Contact information: [place] [address] [phone]        Yetta Flock, MD Follow up in 2 week(s).   Specialty: Gastroenterology Contact information: [address] [place] [address] [place] [address] [phone]                The results of significant diagnostics from this hospitalization (including imaging, microbiology, ancillary and laboratory) are listed below for reference.    Significant Diagnostic Studies: CT HEAD WO CONTRAST  Result Date: 12/14/2019 CLINICAL DATA:  Slurred speech since 7 a.m., abnormal gait, last known well sign a morning EXAM: CT HEAD WITHOUT CONTRAST TECHNIQUE: Contiguous axial images were obtained from the base of the skull through the vertex without intravenous contrast. COMPARISON:  None. FINDINGS: Brain: No acute infarct or hemorrhage. Lateral ventricles and midline structures are unremarkable. No acute extra-axial fluid collections. No mass effect. Vascular: Significant atherosclerosis of the internal carotid arteries. No hyperdense vessel. Skull: Normal. Negative for fracture or focal lesion. Sinuses/Orbits: Mild mucosal thickening within the anterior ethmoid air cells. Remaining paranasal sinuses are clear. Other: None. IMPRESSION: 1. No acute intracranial process. Electronically Signed   By: Randa Ngo M.D.   On: 12/14/2019 19:03   MR ANGIO HEAD WO  CONTRAST  Result Date: 12/15/2019 CLINICAL DATA:  Stroke follow-up EXAM: MRA NECK WITHOUT CONTRAST MRA HEAD WITHOUT CONTRAST TECHNIQUE: Angiographic images of the neck and circle of Willis were obtained using MRA technique without intravenous contrast. COMPARISON:  None. FINDINGS: MRA NECK FINDINGS Normal 3 vessel branching pattern of the aorta. Vertebral arteries are left-dominant. Both vertebral arteries are normal to the skull base. The carotid systems are normal. MRA HEAD FINDINGS POSTERIOR CIRCULATION: --Vertebral arteries: Normal V4 segments. --Inferior cerebellar arteries: Normal. --Basilar artery: Normal. --Superior cerebellar arteries: Normal. --Posterior cerebral arteries: Normal. There are bilateral posterior communicating arteries (p-comm) that partially supply the PCAs. ANTERIOR CIRCULATION: --Intracranial internal carotid arteries: Ectatic appearance of the terminal portion of the left internal carotid artery, at the origin of the left MCA. There are heterogeneous flow related enhancement characteristics. Normal right ICA. --Anterior cerebral arteries (ACA): Normal. Both A1 segments are present. Patent anterior communicating artery (a-comm). --Middle cerebral arteries (MCA): Normal. IMPRESSION: 1. No occlusion or hemodynamically significant stenosis by NASCET criteria. 2. 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 CTA of the head is recommended. Electronically Signed   By: Cletus Gash.D.  On: 12/15/2019 03:30   MR ANGIO NECK WO CONTRAST  Result Date: 12/15/2019 CLINICAL DATA:  Stroke follow-up EXAM: MRA NECK WITHOUT CONTRAST MRA HEAD WITHOUT CONTRAST TECHNIQUE: Angiographic images of the neck and circle of Willis were obtained using MRA technique without intravenous contrast. COMPARISON:  None. FINDINGS: MRA NECK FINDINGS Normal 3 vessel branching pattern of the aorta. Vertebral arteries are left-dominant. Both vertebral arteries are normal to  the skull base. The carotid systems are normal. MRA HEAD FINDINGS POSTERIOR CIRCULATION: --Vertebral arteries: Normal V4 segments. --Inferior cerebellar arteries: Normal. --Basilar artery: Normal. --Superior cerebellar arteries: Normal. --Posterior cerebral arteries: Normal. There are bilateral posterior communicating arteries (p-comm) that partially supply the PCAs. ANTERIOR CIRCULATION: --Intracranial internal carotid arteries: Ectatic appearance of the terminal portion of the left internal carotid artery, at the origin of the left MCA. There are heterogeneous flow related enhancement characteristics. Normal right ICA. --Anterior cerebral arteries (ACA): Normal. Both A1 segments are present. Patent anterior communicating artery (a-comm). --Middle cerebral arteries (MCA): Normal. IMPRESSION: 1. No occlusion or hemodynamically significant stenosis by NASCET criteria. 2. 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 CTA of the head is recommended. Electronically Signed   By: Ulyses Jarred M.D.   On: 12/15/2019 03:30   MR BRAIN WO CONTRAST  Result Date: 12/14/2019 CLINICAL DATA:  Dizziness EXAM: MRI HEAD WITHOUT CONTRAST TECHNIQUE: Multiplanar, multiecho pulse sequences of the brain and surrounding structures were obtained without intravenous contrast. COMPARISON:  None. FINDINGS: Brain: There is an acute infarct of the ventral left pons. Mild periventricular white matter hyperintensity. Mild generalized volume loss. Midline structures are normal. No chronic microhemorrhage. Vascular: Normal flow voids. Skull and upper cervical spine: Normal Sinuses/Orbits: Bilateral ocular lens replacements. Sinuses are clear. 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 No hemorrhage or mass effect. Electronically Signed   By: Ulyses Jarred M.D.   On: 12/14/2019 23:00   ECHOCARDIOGRAM COMPLETE  Result Date: 12/15/2019    ECHOCARDIOGRAM REPORT   Patient Name:    Carl Lam Date of Exam: 12/15/2019 Medical Rec #:  [id number]         Height:       73.0 in Accession #:    [id number]        Weight:       232.0 lb Date of Birth:  1945-05-21         BSA:          2.292 m Patient Age:    23 years          BP:           127/83 mmHg Patient Gender: M                 HR:           83 bpm. Exam Location:  Inpatient Procedure: 2D Echo, Cardiac Doppler and Color Doppler Indications:    Stroke 434.91 / I163.9  History:        Patient has no prior history of Echocardiogram examinations.                 Risk Factors:Hypertension, Diabetes, Dyslipidemia and Former                 Smoker.  Sonographer:    Vickie Epley RDCS Referring Phys: Many  1. Left ventricular ejection fraction, by estimation, is 60 to 65%. The left ventricle has normal function. The left ventricle has no  regional wall motion abnormalities. Left ventricular diastolic parameters are consistent with Grade I diastolic dysfunction (impaired relaxation).  2. Right ventricular systolic function is normal. The right ventricular size is normal. Tricuspid regurgitation signal is inadequate for assessing PA pressure.  3. The mitral valve is normal in structure. No evidence of mitral valve regurgitation. No evidence of mitral stenosis.  4. The aortic valve is tricuspid. Aortic valve regurgitation is not visualized. No aortic stenosis is present.  5. Aortic dilatation noted. There is mild dilatation of the aortic root measuring 38 mm.  6. The inferior vena cava is normal in size with greater than 50% respiratory variability, suggesting right atrial pressure of 3 mmHg. FINDINGS  Left Ventricle: Left ventricular ejection fraction, by estimation, is 60 to 65%. The left ventricle has normal function. The left ventricle has no regional wall motion abnormalities. The left ventricular internal cavity size was normal in size. There is  no left ventricular hypertrophy. Left ventricular diastolic parameters  are consistent with Grade I diastolic dysfunction (impaired relaxation). Right Ventricle: The right ventricular size is normal. No increase in right ventricular wall thickness. Right ventricular systolic function is normal. Tricuspid regurgitation signal is inadequate for assessing PA pressure. Left Atrium: Left atrial size was normal in size. Right Atrium: Right atrial size was normal in size. Pericardium: There is no evidence of pericardial effusion. Mitral Valve: The mitral valve is normal in structure. No evidence of mitral valve regurgitation. No evidence of mitral valve stenosis. Tricuspid Valve: The tricuspid valve is normal in structure. Tricuspid valve regurgitation is not demonstrated. Aortic Valve: The aortic valve is tricuspid. Aortic valve regurgitation is not visualized. No aortic stenosis is present. Pulmonic Valve: The pulmonic valve was normal in structure. Pulmonic valve regurgitation is not visualized. Aorta: Aortic dilatation noted. There is mild dilatation of the aortic root measuring 38 mm. Venous: The inferior vena cava is normal in size with greater than 50% respiratory variability, suggesting right atrial pressure of 3 mmHg. IAS/Shunts: No atrial level shunt detected by color flow Doppler.  LEFT VENTRICLE PLAX 2D LVIDd:         4.70 cm      Diastology LVIDs:         3.10 cm      LV e' lateral:   6.64 cm/s LV PW:         0.80 cm      LV E/e' lateral: 7.3 LV IVS:        0.80 cm      LV e' medial:    4.57 cm/s LVOT diam:     2.60 cm      LV E/e' medial:  10.7 LV SV:         89 LV SV Index:   39 LVOT Area:     5.31 cm  LV Volumes (MOD) LV vol d, MOD A2C: 83.5 ml LV vol d, MOD A4C: 100.0 ml LV vol s, MOD A2C: 40.2 ml LV vol s, MOD A4C: 46.7 ml LV SV MOD A2C:     43.3 ml LV SV MOD A4C:     100.0 ml LV SV MOD BP:      50.1 ml RIGHT VENTRICLE RV S prime:     11.30 cm/s TAPSE (M-mode): 1.7 cm LEFT ATRIUM             Index       RIGHT ATRIUM          Index LA diam:  4.20 cm 1.83 cm/m  RA Area:      9.16 cm LA Vol (A2C):   33.7 ml 14.71 ml/m RA Volume:   19.60 ml 8.55 ml/m LA Vol (A4C):   33.6 ml 14.66 ml/m LA Biplane Vol: 34.0 ml 14.84 ml/m  AORTIC VALVE LVOT Vmax:   82.90 cm/s LVOT Vmean:  56.300 cm/s LVOT VTI:    0.168 m  AORTA Ao Root diam: 3.80 cm MITRAL VALVE MV Area (PHT): 3.42 cm    SHUNTS MV Decel Time: 222 msec    Systemic VTI:  0.17 m MV E velocity: 48.80 cm/s  Systemic Diam: 2.60 cm MV A velocity: 77.10 cm/s MV E/A ratio:  0.63 Loralie Champagne MD Electronically signed by Loralie Champagne MD Signature Date/Time: 12/15/2019/4:22:38 PM    Final     Microbiology: Recent Results (from the past 240 hour(s))  SARS Coronavirus 2 by RT PCR (hospital order, performed in [place] hospital lab) Nasopharyngeal Nasopharyngeal Swab     Status: None   Collection Time: 12/14/19 10:17 PM   Specimen: Nasopharyngeal Swab  Result Value Ref Range Status   SARS Coronavirus 2 NEGATIVE NEGATIVE Final    Comment: (NOTE) SARS-CoV-2 target nucleic acids are NOT DETECTED.  The SARS-CoV-2 RNA is generally detectable in upper and lower respiratory specimens during the acute phase of infection. The lowest concentration of SARS-CoV-2 viral copies this assay can detect is 250 copies / mL. A negative result does not preclude SARS-CoV-2 infection and should not be used as the sole basis for treatment or other patient management decisions.  A negative result may occur with improper specimen collection / handling, submission of specimen other than nasopharyngeal swab, presence of viral mutation(s) within the areas targeted by this assay, and inadequate number of viral copies (<250 copies / mL). A negative result must be combined with clinical observations, patient history, and epidemiological information.  Fact Sheet for Patients:   StrictlyIdeas.no  Fact Sheet for Healthcare Providers: BankingDealers.co.za  This test is not yet approved or  cleared by  the [place] FDA and has been authorized for detection and/or diagnosis of SARS-CoV-2 by FDA under an Emergency Use Authorization (EUA).  This EUA will remain in effect (meaning this test can be used) for the duration of the COVID-19 declaration under Section 564(b)(1) of the Act, 21 U.S.C. section 360bbb-3(b)(1), unless the authorization is terminated or revoked sooner.  Performed at [facility], [place] [address] [place], [place] [address]      Labs: Basic Metabolic Panel: Recent Labs  Lab 12/14/19 1822 12/14/19 1825 12/17/19 0413  NA 141 144 142  K 4.4 4.4 4.0  CL 109 107 112*  CO2 26  --  24  GLUCOSE 196* 182* 220*  BUN 14 16 8   CREATININE 1.78* 1.80* 1.41*  CALCIUM 9.2  --  8.5*   Liver Function Tests: Recent Labs  Lab 12/14/19 1822  AST 13*  ALT 11  ALKPHOS 43  BILITOT 0.3  PROT 6.8  ALBUMIN 3.8   No results for input(s): LIPASE, AMYLASE in the last 168 hours. No results for input(s): AMMONIA in the last 168 hours. CBC: Recent Labs  Lab 12/14/19 1822 12/14/19 1825 12/15/19 0258 12/15/19 1411 12/17/19 0413  WBC 5.9  --  7.3 7.4 5.6  NEUTROABS 3.7  --   --   --   --   HGB 8.7* 8.8* 7.9* 8.6* 7.3*  HCT 27.0* 26.0* 24.9* 26.7* 22.2*  MCV 94.1  --  94.7 93.4  91.4  PLT 243  --  228 214 218    CBG: Recent Labs  Lab 12/16/19 1644 12/16/19 2052 12/16/19 2356 12/17/19 0408 12/17/19 0817  GLUCAP 112* 266* 197* 223* 180*       Signed:  Oswald Hillock MD.  [facility] 12/17/2019, 11:16 AM

## 2019-12-17 NOTE — Plan of Care (Signed)

## 2019-12-17 NOTE — Anesthesia Preprocedure Evaluation (Addendum)
Anesthesia Evaluation  Patient identified by MRN, date of birth, ID band  Reviewed: Allergy & Precautions, NPO status , Patient's Chart, lab work & pertinent test results  Airway Mallampati: II  TM Distance: >3 FB     Dental   Pulmonary Patient abstained from smoking., former smoker,    breath sounds clear to auscultation       Cardiovascular hypertension,  Rhythm:Regular     Neuro/Psych    GI/Hepatic Neg liver ROS, History noted CG   Endo/Other  diabetes  Renal/GU negative Renal ROS     Musculoskeletal   Abdominal   Peds  Hematology  (+) anemia ,   Anesthesia Other Findings   Reproductive/Obstetrics                             Anesthesia Physical Anesthesia Plan  ASA: III  Anesthesia Plan:    Post-op Pain Management:    Induction: Intravenous  PONV Risk Score and Plan: Propofol infusion  Airway Management Planned: Nasal Cannula and Simple Face Mask  Additional Equipment:   Intra-op Plan:   Post-operative Plan:   Informed Consent: I have reviewed the patients History and Physical, chart, labs and discussed the procedure including the risks, benefits and alternatives for the proposed anesthesia with the patient or authorized representative who has indicated his/her understanding and acceptance.     Dental advisory given  Plan Discussed with: Anesthesiologist  Anesthesia Plan Comments:         Anesthesia Quick Evaluation

## 2019-12-17 NOTE — Progress Notes (Signed)
Occupational Therapy Treatment Patient Details Name: BERNELL SIGAL MRN: [id number] DOB: [birth year] Today's Date: 12/17/2019    History of present illness ZORION NIMS is a 74 y.o. male with history of hypertension, diabetes, hyperlipidemia, chronic kidney disease stage II was referred to the ER by patient's primary care physician after patient was found to be increasingly weak and difficult to walk with gait disturbances. 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   OT comments  Patient supine in bed and agreeable to OT session.  Completes bed mobility without assist, at EOB engaged in pill box test to assess functional cognition and safety with engagement in IADLs.  Pt failed the assessment, demonstrating poor planning, memory of task, attention, mental flexibility, suboptimal search strategies, concrete thinking and the inability to multitask- see cognitive section for details.  Spouse agreeable to assist patient with medications at discharge.  Will follow acutely, continue to recommend 24/7 support and HHOT.    Follow Up Recommendations  Home health OT;Supervision/Assistance - 24 hour (inital 24/7 )    Equipment Recommendations  3 in 1 bedside commode    Recommendations for Other Services      Precautions / Restrictions Precautions Precautions: Fall Precaution Comments: R knee weakness/buckling Restrictions Weight Bearing Restrictions: No       Mobility Bed Mobility Overal bed mobility: Modified Independent                Transfers                      Balance Overall balance assessment: Needs assistance Sitting-balance support: No upper extremity supported;Feet supported Sitting balance-Leahy Scale: Good                                     ADL either performed or assessed with clinical judgement   ADL Overall ADL's : Needs assistance/impaired                                       General ADL Comments: session focused on  functional cognition using pill box test     Vision   Vision Assessment?: No apparent visual deficits Additional Comments: wears reaading glasses to read medicine bottles   Perception     Praxis      Cognition Arousal/Alertness: Awake/alert Behavior During Therapy: WFL for tasks assessed/performed Overall Cognitive Status: Impaired/Different from baseline Area of Impairment: Attention;Memory;Following commands;Safety/judgement;Awareness;Problem solving                   Current Attention Level: Sustained Memory: Decreased recall of precautions;Decreased short-term memory Following Commands: Follows one step commands consistently;Follows one step commands with increased time;Follows multi-step commands inconsistently Safety/Judgement: Decreased awareness of deficits Awareness: Emergent Problem Solving: Requires verbal cues;Difficulty sequencing;Slow processing General Comments: patient engaged in pill box test to assess functional cognition--see below for further details    General Comments: Assessed using the Pill Box Test. Pt failed the assessment, demonstrating poor planning, mental flexibility, suboptimal search strategies, concrete thinking and the inability to multitask. Pt had a total of 37 errors, where more than 3 errors is considered a fail.  Errors: One tablet 3x/day (yellow) - 18 errors (omission/misplacement) One tablet 2x/day with breakfast and dinner (green) - 12 errors (omission/misplacement) One tablet in the morning (Blue)- 6 errors (omission/misplacement) One tablet daily  at bedtime (orange) - 0 errors(omission/misplacement) One tablet every other day (red) - 1 errors (omission/misplacement)   Number of misplaced movement errors (pills placed in incorrect compartment)- 1 Number of total errors (sum of omissions; misplacements) - 37 Total time to complete task (allowed 5 min) - 10 min   Patient verbalized understanding of directions to complete the pill  box for the entire week.  Patient redirected to use entire pill box and to notice time of day on pill box, correctly fixed first 3x/day pill from all in AM to AM/NOON/EVENING, but only placing for 1 day.  Next pill used bedtime pill, self corrected to place for the entire week; but then for the remainder of the pills patient returned to only filling out for the day 1.  With final pill and minimal cueing, patient able to correctly place every other day. --When presented with challenges in attention, multitasking, problem solving, and memory and denied deficits and blames external differences, unfortunately spouse agrees with him.  Educated patient and spouse on role of pill box test, importance of these cognitive tasks, and relation to IADLs but limited understanding.  Patient and spouse agreeable to complete medications together initially.       Exercises     Shoulder Instructions       General Comments spouse present and supportive; agreeable to recommendations of having assistance with medications at this time     Pertinent Vitals/ Pain       Pain Assessment: No/denies pain  Home Living                                          Prior Functioning/Environment              Frequency  Min 2X/week        Progress Toward Goals  OT Goals(current goals can now be found in the care plan section)  Progress towards OT goals: Progressing toward goals  Acute Rehab OT Goals Patient Stated Goal: home OT Goal Formulation: With patient  Plan Discharge plan remains appropriate;Frequency remains appropriate    Co-evaluation                 AM-PAC OT "6 Clicks" Daily Activity     Outcome Measure   Help from another person eating meals?: None Help from another person taking care of personal grooming?: A Little Help from another person toileting, which includes using toliet, bedpan, or urinal?: A Little Help from another person bathing (including washing, rinsing,  drying)?: A Little Help from another person to put on and taking off regular upper body clothing?: A Little Help from another person to put on and taking off regular lower body clothing?: A Little 6 Click Score: 19    End of Session    OT Visit Diagnosis: Other abnormalities of gait and mobility (R26.89);Muscle weakness (generalized) (M62.81);Other symptoms and signs involving the nervous system (R29.898)   Activity Tolerance Patient tolerated treatment well   Patient Left with call bell/phone within reach;Other (comment);with bed alarm set (seated EOB )   Nurse Communication Mobility status        Time: 1344-1406 OT Time Calculation (min): 22 min  Charges: OT General Charges $OT Visit: 1 Visit OT Treatments $Cognitive Funtion inital: Initial 15 mins $Cognitive Funtion additional: Additional15 mins  Jolaine Artist, OT Acute Rehabilitation Services Pager [phone] Office [place]  Camelle Henkels 12/17/2019, 3:05 PM

## 2019-12-17 NOTE — Anesthesia Postprocedure Evaluation (Signed)
Anesthesia Post Note  Patient: Carl Lam  Procedure(s) Performed: COLONOSCOPY WITH PROPOFOL (N/A ) ESOPHAGOGASTRODUODENOSCOPY (EGD) WITH PROPOFOL (N/A ) POLYPECTOMY HEMOSTASIS CLIP PLACEMENT     Patient location during evaluation: Endoscopy Anesthesia Type: MAC Level of consciousness: awake Pain management: pain level controlled Vital Signs Assessment: post-procedure vital signs reviewed and stable Respiratory status: spontaneous breathing Cardiovascular status: stable Postop Assessment: epidural receding Anesthetic complications: no   No complications documented.  Last Vitals:  Vitals:   12/17/19 0954 12/17/19 1123  BP: 135/83 (!) 146/90  Pulse: 74 71  Resp:  18  Temp: 37 C 36.7 C  SpO2: 100% 100%    Last Pain:  Vitals:   12/17/19 1123  TempSrc: Oral  PainSc:                  Jarl Sellitto

## 2019-12-18 ENCOUNTER — Telehealth: Payer: Self-pay | Admitting: Family Medicine

## 2019-12-18 LAB — TYPE AND SCREEN
ABO/RH(D): O POS
Antibody Screen: NEGATIVE
Unit division: 0
Unit division: 0

## 2019-12-18 LAB — BPAM RBC
Blood Product Expiration Date: 202109082359
Blood Product Expiration Date: 202109262359
ISSUE DATE / TIME: 202108310554
ISSUE DATE / TIME: 202109020919
Unit Type and Rh: 5100
Unit Type and Rh: 5100

## 2019-12-18 NOTE — Telephone Encounter (Signed)
Transition Care Management Follow-up Telephone Call  Date of discharge and from where: 12/17/2019 from [facility]   How have you been since you were released from the hospital? Patient states that he is doing great  Any questions or concerns? No  Items Reviewed:  Did the pt receive and understand the discharge instructions provided? Yes   Medications obtained and verified? Yes   Any new allergies since your discharge? Yes   Dietary orders reviewed? Yes  Do you have support at home? Yes patient has his wife in the home.  Functional Questionnaire: (I = Independent and D = Dependent) ADLs: I  Bathing/Dressing- I  Meal Prep- I  Eating- I  Maintaining continence- I  Transferring/Ambulation- I  Managing Meds- I  Follow up appointments reviewed:   PCP Hospital f/u appt confirmed? Yes  Scheduled to see Dr. Elease Hashimoto on 01/06/2020 @ 8:45 am. Patient was not able to come sooner as this is the only day his wife can accompany him to the appointment   Specialist Hospital f/u appt confirmed? No    Are transportation arrangements needed? No   If their condition worsens, is the pt aware to call PCP or go to the Emergency Dept.? Yes  Was the patient provided with contact information for the PCP's office or ED? Yes  Was to pt encouraged to call back with questions or concerns? Yes

## 2019-12-20 ENCOUNTER — Encounter (HOSPITAL_COMMUNITY): Payer: Self-pay | Admitting: Gastroenterology

## 2019-12-23 ENCOUNTER — Other Ambulatory Visit: Payer: Self-pay | Admitting: Family Medicine

## 2020-01-06 ENCOUNTER — Ambulatory Visit: Payer: Federal, State, Local not specified - PPO | Admitting: Family Medicine

## 2020-01-06 ENCOUNTER — Encounter: Payer: Self-pay | Admitting: Family Medicine

## 2020-01-06 ENCOUNTER — Other Ambulatory Visit: Payer: Self-pay

## 2020-01-06 VITALS — BP 130/80 | HR 74 | Temp 98.3°F | Wt 239.3 lb

## 2020-01-06 DIAGNOSIS — I10 Essential (primary) hypertension: Secondary | ICD-10-CM

## 2020-01-06 DIAGNOSIS — D62 Acute posthemorrhagic anemia: Secondary | ICD-10-CM

## 2020-01-06 DIAGNOSIS — I639 Cerebral infarction, unspecified: Secondary | ICD-10-CM | POA: Diagnosis not present

## 2020-01-06 DIAGNOSIS — K922 Gastrointestinal hemorrhage, unspecified: Secondary | ICD-10-CM | POA: Diagnosis not present

## 2020-01-06 DIAGNOSIS — Z794 Long term (current) use of insulin: Secondary | ICD-10-CM

## 2020-01-06 DIAGNOSIS — E785 Hyperlipidemia, unspecified: Secondary | ICD-10-CM

## 2020-01-06 DIAGNOSIS — E1165 Type 2 diabetes mellitus with hyperglycemia: Secondary | ICD-10-CM

## 2020-01-06 NOTE — Patient Instructions (Signed)

## 2020-01-06 NOTE — Progress Notes (Signed)
Established Patient Office Visit  Subjective:  Patient ID: Carl Lam, male    DOB: January 28, 1946  Age: 74 y.o. MRN: [id number]  CC:  Chief Complaint  Patient presents with  . Hospitalization Follow-up    HPI Carl Lam presents for hospital follow-up.  He had presented here on 12/14/19 with stroke symptoms.  He had some speech difficulties and difficulty walking and right-sided weakness.  CT head was unremarkable.  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  He also was noted on admission to have hemoglobin in the 7 range.  Past history of diverticulosis.  He also had guaiac positive stool.  Patient was seen in consultation by neurology.  Echocardiogram was unremarkable set for grade 1 diastolic dysfunction.  No thrombus.  MRA head and neck no large vessel occlusion.  He was initially not started on Plavix or aspirin because of his GI bleed.  After EGD and colonoscopy was started on baby aspirin 81 mg.  LDL cholesterol 59.  A1c 6.0%.  Blood pressure was stable.  Decision was made not to start Plavix at discharge because of his diverticulosis  Patient had been in the 15 range hemoglobin and this had dropped down to 7.  He received 2 units of packed red blood cells during admission.  EGD unremarkable.  Colonoscopy showed diverticulosis left and right colon.  No active bleeding at the time of admission.  Patient did have a couple benign polyps with recommended colonoscopy follow-up 3 years.  He has not noted any bloody stools since discharge.  He has pending follow-up with GI.  He was started on Protonix 40 mg daily at discharge.  He has type 2 diabetes treated with Metformin and Basaglar.  These medications were continued.  A1c 6.0%.  Patient has home physical therapy.  He is progressing with regard to his weakness.  Ambulating with no assistance.  Right upper extremity weakness has essentially resolved.  Discharge medications reviewed  Past Medical History:  Diagnosis  Date  . Acute lower GI bleeding 10/11/11  . DEGENERATIVE JOINT DISEASE 10/27/2008   Qualifier: Diagnosis of  By: Elease Hashimoto MD, Joni Colegrove    . DIAB W/O COMP TYPE II/UNS NOT STATED UNCNTRL 10/27/2008   Qualifier: Diagnosis of  By: Valma Cava LPN, Izora Gala    . HYPERLIPIDEMIA 04/27/2009   Qualifier: Diagnosis of  By: Elease Hashimoto MD, Andretta Ergle    . HYPERTENSION 10/27/2008   Qualifier: Diagnosis of  By: Valma Cava LPN, Izora Gala      Past Surgical History:  Procedure Laterality Date  . COLONOSCOPY  10/12/2011   Procedure: COLONOSCOPY;  Surgeon: Lafayette Dragon, MD;  Location: [facility] ENDOSCOPY;  Service: Endoscopy;  Laterality: N/A;  . COLONOSCOPY WITH PROPOFOL N/A 12/16/2019   Procedure: COLONOSCOPY WITH PROPOFOL;  Surgeon: Ladene Artist, MD;  Location: [facility] ENDOSCOPY;  Service: Endoscopy;  Laterality: N/A;  . ESOPHAGOGASTRODUODENOSCOPY  10/12/2011   Procedure: ESOPHAGOGASTRODUODENOSCOPY (EGD);  Surgeon: Lafayette Dragon, MD;  Location[facility] ENDOSCOPY;  Service: Endoscopy;  Laterality: N/A;  . ESOPHAGOGASTRODUODENOSCOPY (EGD) WITH PROPOFOL N/A 12/16/2019   Procedure: ESOPHAGOGASTRODUODENOSCOPY (EGD) WITH PROPOFOL;  Surgeon: Ladene Artist, MD;  Location: [facility] ENDOSCOPY;  Service: Endoscopy;  Laterality: N/A;  . HEMOSTASIS CLIP PLACEMENT  12/16/2019   Procedure: HEMOSTASIS CLIP PLACEMENT;  Surgeon: Ladene Artist, MD;  Location: [facility] ENDOSCOPY;  Service: Endoscopy;;  . HERNIA REPAIR  2007   "navel"  . JOINT REPLACEMENT    . KNEE CARTILAGE SURGERY  1960's and 1970's   left; "2 total"  .  POLYPECTOMY  12/16/2019   Procedure: POLYPECTOMY;  Surgeon: Ladene Artist, MD;  Location: [facility] ENDOSCOPY;  Service: Endoscopy;;  . TOTAL HIP ARTHROPLASTY  2007    Family History  Problem Relation Age of Onset  . Alcohol abuse Other        grandparent  . Arthritis Other        arthritis  . Diabetes Other        grandparent    Social History   Socioeconomic History  . Marital status: Married    Spouse name: Not on file  . Number of  children: Not on file  . Years of education: Not on file  . Highest education level: Not on file  Occupational History  . Not on file  Tobacco Use  . Smoking status: Former Smoker    Packs/day: 0.30    Years: 15.00    Pack years: 4.50    Types: Cigarettes    Quit date: 10/11/1978    Years since quitting: 41.2  . Smokeless tobacco: Never Used  Vaping Use  . Vaping Use: Never used  Substance and Sexual Activity  . Alcohol use: Yes    Comment: 10/11/11 "last alcohol 27-28 years ago"  . Drug use: Yes    Types: Marijuana    Comment: "recreational marijuana in [employer]; 1970's"  . Sexual activity: Not Currently  Other Topics Concern  . Not on file  Social History Narrative  . Not on file   Social Determinants of Health   Financial Resource Strain:   . Difficulty of Paying Living Expenses: Not on file  Food Insecurity:   . Worried About Charity fundraiser in the Last Year: Not on file  . Ran Out of Food in the Last Year: Not on file  Transportation Needs:   . Lack of Transportation (Medical): Not on file  . Lack of Transportation (Non-Medical): Not on file  Physical Activity:   . Days of Exercise per Week: Not on file  . Minutes of Exercise per Session: Not on file  Stress:   . Feeling of Stress : Not on file  Social Connections:   . Frequency of Communication with Friends and Family: Not on file  . Frequency of Social Gatherings with Friends and Family: Not on file  . Attends Religious Services: Not on file  . Active Member of Clubs or Organizations: Not on file  . Attends Archivist Meetings: Not on file  . Marital Status: Not on file  Intimate Partner Violence:   . Fear of Current or Ex-Partner: Not on file  . Emotionally Abused: Not on file  . Physically Abused: Not on file  . Sexually Abused: Not on file    Outpatient Medications Prior to Visit  Medication Sig Dispense Refill  . aspirin EC 81 MG EC tablet Take 1 tablet (81 mg total) by mouth daily.  Swallow whole. 30 tablet 11  . atorvastatin (LIPITOR) 20 MG tablet Take 1 tablet (20 mg total) by mouth daily. 90 tablet 3  . gabapentin (NEURONTIN) 100 MG capsule Take 2 capsules (200 mg total) by mouth 2 (two) times daily. 360 capsule 3  . Insulin Glargine (BASAGLAR KWIKPEN) 100 UNIT/ML INJECT 30 UNITS SUBCUTANEOUSLY AT BEDTIME 15 mL 0  . lisinopril (ZESTRIL) 20 MG tablet Take 1 tablet (20 mg total) by mouth daily. 90 tablet 3  . metFORMIN (GLUCOPHAGE) 500 MG tablet Take two tablets twice daily. (Patient taking differently: Take 1,000 mg by mouth 2 (two) times  daily with a meal. Take two tablets twice daily.) 360 tablet 3  . pantoprazole (PROTONIX) 40 MG tablet Take 1 tablet (40 mg total) by mouth daily at 6 (six) AM. 30 tablet 3   No facility-administered medications prior to visit.    No Known Allergies  ROS Review of Systems  Constitutional: Negative for activity change, appetite change, fatigue, fever and unexpected weight change.  HENT: Negative for congestion, ear pain and trouble swallowing.   Eyes: Negative for pain and visual disturbance.  Respiratory: Negative for cough, shortness of breath and wheezing.   Cardiovascular: Negative for chest pain and palpitations.  Gastrointestinal: Negative for abdominal distention, abdominal pain, blood in stool, constipation, diarrhea, nausea, rectal pain and vomiting.  Endocrine: Negative for polydipsia and polyuria.  Genitourinary: Negative for dysuria, hematuria and testicular pain.  Musculoskeletal: Negative for arthralgias and joint swelling.  Skin: Negative for rash.  Neurological: Negative for dizziness, syncope and headaches.  Hematological: Negative for adenopathy.  Psychiatric/Behavioral: Negative for confusion and dysphoric mood.      Objective:    Physical Exam Vitals reviewed.  Constitutional:      Appearance: Normal appearance.  Cardiovascular:     Rate and Rhythm: Normal rate and regular rhythm.  Pulmonary:      Effort: Pulmonary effort is normal.     Breath sounds: Normal breath sounds.  Abdominal:     Palpations: Abdomen is soft.     Tenderness: There is no abdominal tenderness.  Musculoskeletal:     Right lower leg: No edema.     Left lower leg: No edema.  Neurological:     Mental Status: He is alert.     BP 130/80 (BP Location: Left Arm, Patient Position: Sitting, Cuff Size: Normal)   Pulse 74   Temp 98.3 F (36.8 C) (Oral)   Wt 239 lb 4.8 oz (108.5 kg)   SpO2 99%   BMI 31.57 kg/m  Wt Readings from Last 3 Encounters:  01/06/20 239 lb 4.8 oz (108.5 kg)  12/14/19 232 lb (105.2 kg)  12/14/19 236 lb (107 kg)     Health Maintenance Due  Topic Date Due  . TETANUS/TDAP  Never done    There are no preventive care reminders to display for this patient.  Lab Results  Component Value Date   TSH 2.66 07/15/2017   Lab Results  Component Value Date   WBC 5.6 12/17/2019   HGB 8.9 (L) 12/17/2019   HCT 26.8 (L) 12/17/2019   MCV 91.4 12/17/2019   PLT 218 12/17/2019   Lab Results  Component Value Date   NA 142 12/17/2019   K 4.0 12/17/2019   CO2 24 12/17/2019   GLUCOSE 220 (H) 12/17/2019   BUN 8 12/17/2019   CREATININE 1.41 (H) 12/17/2019   BILITOT 0.3 12/14/2019   ALKPHOS 43 12/14/2019   AST 13 (L) 12/14/2019   ALT 11 12/14/2019   PROT 6.8 12/14/2019   ALBUMIN 3.8 12/14/2019   CALCIUM 8.5 (L) 12/17/2019   ANIONGAP 6 12/17/2019   GFR 65.02 10/15/2018   Lab Results  Component Value Date   CHOL 107 12/15/2019   Lab Results  Component Value Date   HDL 36 (L) 12/15/2019   Lab Results  Component Value Date   LDLCALC 59 12/15/2019   Lab Results  Component Value Date   TRIG 61 12/15/2019   Lab Results  Component Value Date   CHOLHDL 3.0 12/15/2019   Lab Results  Component Value Date   HGBA1C 6.0 (H)  12/15/2019      Assessment & Plan:   #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  Patient had some right upper extremity weakness and initially had some  speech change and those have cleared.  He still has some mild weakness right lower extremity and he thinks some of this is chronic related to previous hip replacement.  He is ambulating without difficulty.  He has home PT still set in place  -Continue aspirin 81 mg daily -We discussed secondary prevention.  His A1c recent was 6.0%.  LDL cholesterol less than 70.  Blood pressure 130/80  #2 acute blood loss anemia from lower GI diverticulosis bleed  -Recheck CBC today  #3 type 2 diabetes well controlled with A1c 6.0% -Continue Metformin and Basaglar insulin.  Will recheck A1c at follow-up which is scheduled for November  #4 health maintenance -Flu vaccine advised and patient declines.  He has had Covid vaccine.  No orders of the defined types were placed in this encounter.   Follow-up: No follow-ups on file.    Carolann Littler, MD

## 2020-01-07 ENCOUNTER — Other Ambulatory Visit: Payer: Self-pay

## 2020-01-07 ENCOUNTER — Encounter (HOSPITAL_COMMUNITY): Payer: Self-pay | Admitting: Emergency Medicine

## 2020-01-07 ENCOUNTER — Observation Stay (HOSPITAL_COMMUNITY)
Admission: EM | Admit: 2020-01-07 | Discharge: 2020-01-09 | Disposition: A | Discharge status: Home / Self Care | Payer: Federal, State, Local not specified - PPO | Attending: Internal Medicine | Admitting: Internal Medicine

## 2020-01-07 ENCOUNTER — Telehealth: Payer: Self-pay | Admitting: Gastroenterology

## 2020-01-07 DIAGNOSIS — E1165 Type 2 diabetes mellitus with hyperglycemia: Secondary | ICD-10-CM | POA: Diagnosis present

## 2020-01-07 DIAGNOSIS — Z87891 Personal history of nicotine dependence: Secondary | ICD-10-CM | POA: Insufficient documentation

## 2020-01-07 DIAGNOSIS — K625 Hemorrhage of anus and rectum: Secondary | ICD-10-CM | POA: Diagnosis not present

## 2020-01-07 DIAGNOSIS — I10 Essential (primary) hypertension: Secondary | ICD-10-CM

## 2020-01-07 DIAGNOSIS — Z20822 Contact with and (suspected) exposure to covid-19: Secondary | ICD-10-CM | POA: Diagnosis not present

## 2020-01-07 DIAGNOSIS — Z7982 Long term (current) use of aspirin: Secondary | ICD-10-CM | POA: Diagnosis not present

## 2020-01-07 DIAGNOSIS — Z7984 Long term (current) use of oral hypoglycemic drugs: Secondary | ICD-10-CM | POA: Insufficient documentation

## 2020-01-07 DIAGNOSIS — K5790 Diverticulosis of intestine, part unspecified, without perforation or abscess without bleeding: Secondary | ICD-10-CM | POA: Diagnosis not present

## 2020-01-07 DIAGNOSIS — D62 Acute posthemorrhagic anemia: Secondary | ICD-10-CM

## 2020-01-07 DIAGNOSIS — Z79899 Other long term (current) drug therapy: Secondary | ICD-10-CM | POA: Diagnosis not present

## 2020-01-07 DIAGNOSIS — K922 Gastrointestinal hemorrhage, unspecified: Secondary | ICD-10-CM | POA: Diagnosis present

## 2020-01-07 LAB — BASIC METABOLIC PANEL
BUN/Creatinine Ratio: 16 (calc) (ref 6–22)
BUN: 25 mg/dL (ref 7–25)
CO2: 25 mmol/L (ref 20–32)
Calcium: 9.6 mg/dL (ref 8.6–10.3)
Chloride: 106 mmol/L (ref 98–110)
Creat: 1.56 mg/dL — ABNORMAL HIGH (ref 0.70–1.18)
Glucose, Bld: 101 mg/dL — ABNORMAL HIGH (ref 65–99)
Potassium: 4.7 mmol/L (ref 3.5–5.3)
Sodium: 140 mmol/L (ref 135–146)

## 2020-01-07 LAB — COMPREHENSIVE METABOLIC PANEL
ALT: 12 U/L (ref 0–44)
AST: 16 U/L (ref 15–41)
Albumin: 3.8 g/dL (ref 3.5–5.0)
Alkaline Phosphatase: 52 U/L (ref 38–126)
Anion gap: 10 (ref 5–15)
BUN: 19 mg/dL (ref 8–23)
CO2: 23 mmol/L (ref 22–32)
Calcium: 8.9 mg/dL (ref 8.9–10.3)
Chloride: 106 mmol/L (ref 98–111)
Creatinine, Ser: 1.52 mg/dL — ABNORMAL HIGH (ref 0.61–1.24)
GFR calc Af Amer: 52 mL/min — ABNORMAL LOW (ref >60)
GFR calc non Af Amer: 45 mL/min — ABNORMAL LOW (ref >60)
Glucose, Bld: 172 mg/dL — ABNORMAL HIGH (ref 70–99)
Potassium: 4.1 mmol/L (ref 3.5–5.1)
Sodium: 139 mmol/L (ref 135–145)
Total Bilirubin: 0.6 mg/dL (ref 0.3–1.2)
Total Protein: 7.2 g/dL (ref 6.5–8.1)

## 2020-01-07 LAB — CBC WITH DIFFERENTIAL/PLATELET
Absolute Monocytes: 582 cells/uL (ref 200–950)
Basophils Absolute: 43 cells/uL (ref 0–200)
Basophils Relative: 0.6 %
Eosinophils Absolute: 142 cells/uL (ref 15–500)
Eosinophils Relative: 2 %
HCT: 34.3 % — ABNORMAL LOW (ref 38.5–50.0)
Hemoglobin: 10.8 g/dL — ABNORMAL LOW (ref 13.2–17.1)
Lymphs Abs: 1406 cells/uL (ref 850–3900)
MCH: 28.6 pg (ref 27.0–33.0)
MCHC: 31.5 g/dL — ABNORMAL LOW (ref 32.0–36.0)
MCV: 90.7 fL (ref 80.0–100.0)
MPV: 10.6 fL (ref 7.5–12.5)
Monocytes Relative: 8.2 %
Neutro Abs: 4927 cells/uL (ref 1500–7800)
Neutrophils Relative %: 69.4 %
Platelets: 375 10*3/uL (ref 140–400)
RBC: 3.78 10*6/uL — ABNORMAL LOW (ref 4.20–5.80)
RDW: 14.6 % (ref 11.0–15.0)
Total Lymphocyte: 19.8 %
WBC: 7.1 10*3/uL (ref 3.8–10.8)

## 2020-01-07 LAB — CBC
HCT: 31.4 % — ABNORMAL LOW (ref 39.0–52.0)
Hemoglobin: 10 g/dL — ABNORMAL LOW (ref 13.0–17.0)
MCH: 28.6 pg (ref 26.0–34.0)
MCHC: 31.8 g/dL (ref 30.0–36.0)
MCV: 89.7 fL (ref 80.0–100.0)
Platelets: 375 10*3/uL (ref 150–400)
RBC: 3.5 MIL/uL — ABNORMAL LOW (ref 4.22–5.81)
RDW: 14.7 % (ref 11.5–15.5)
WBC: 7.5 10*3/uL (ref 4.0–10.5)
nRBC: 0 % (ref 0.0–0.2)

## 2020-01-07 LAB — TYPE AND SCREEN
ABO/RH(D): O POS
Antibody Screen: NEGATIVE

## 2020-01-07 LAB — POC OCCULT BLOOD, ED: Fecal Occult Bld: POSITIVE — AB

## 2020-01-07 MED ORDER — SODIUM CHLORIDE 0.9 % IV SOLN: INTRAVENOUS | Status: DC

## 2020-01-07 MED ORDER — PANTOPRAZOLE SODIUM 40 MG IV SOLR: 40.0000 mg | Freq: Two times a day (BID) | INTRAVENOUS | Status: DC

## 2020-01-07 MED ORDER — SODIUM CHLORIDE 0.9 % IV SOLN
80.0000 mg | Freq: Once | INTRAVENOUS | Status: AC
  Administered 2020-01-07: 80 mg via INTRAVENOUS
  Filled 2020-01-07: qty 80

## 2020-01-07 MED ORDER — SODIUM CHLORIDE 0.9 % IV SOLN
8.0000 mg/h | INTRAVENOUS | Status: DC
  Administered 2020-01-07 – 2020-01-08 (×2): 8 mg/h via INTRAVENOUS
  Filled 2020-01-07 (×3): qty 80

## 2020-01-07 NOTE — ED Provider Notes (Signed)
Carl Lam Provider Note   CSN: [id number] Arrival date & time: 01/07/20  1743     History Chief Complaint  Patient presents with  . Carl Lam is a 74 y.o. male.  The history is provided by the patient and medical records.   74 y.o. M with hx of GI bleeding (recent admission for same and discharged 9/2), recent CVA currently on ASA only, HLP, HTN, DM, presenting to the ED for recurrent GI bleeding.  States after leaving hospital bleeding had stopped, re-started again today.  States it seemed dark in color.  He denies any abdominal pain but does have urge to have BM frequently and feels lots of "gas".  No fever, chills, sweats.  Has been eating/drinking well.  No vomiting.  He is only taking ASA still, has continued on his protonix as well.  Had endoscopy/colonoscopy 12/16/19 with polypectomy.  Past Medical History:  Diagnosis Date  . Acute lower GI bleeding 10/11/11  . DEGENERATIVE JOINT DISEASE 10/27/2008   Qualifier: Diagnosis of  By: Elease Hashimoto MD, Bruce    . DIAB W/O COMP TYPE II/UNS NOT STATED UNCNTRL 10/27/2008   Qualifier: Diagnosis of  By: Valma Cava LPN, Izora Gala    . HYPERLIPIDEMIA 04/27/2009   Qualifier: Diagnosis of  By: Elease Hashimoto MD, Bruce    . HYPERTENSION 10/27/2008   Qualifier: Diagnosis of  By: Valma Cava LPN, Izora Gala      Patient Active Problem List   Diagnosis Date Noted  . Occult Carl in stools   . Benign neoplasm of descending colon   . Benign neoplasm of rectum   . Acute CVA (cerebrovascular accident) ([place]) 12/14/2019  . Acute GI bleeding 12/14/2019  . Paresthesias 09/02/2018  . Paronychia 01/19/2012  . Diverticulosis 10/19/2011  . Anemia due to Carl loss, acute 10/12/2011  . GI bleed 10/11/2011  . Hyperlipidemia 04/27/2009  . Type 2 diabetes mellitus with hyperglycemia ([place]) 10/27/2008  . Essential hypertension 10/27/2008  . DEGENERATIVE JOINT DISEASE 10/27/2008    Past Surgical History:    Procedure Laterality Date  . COLONOSCOPY  10/12/2011   Procedure: COLONOSCOPY;  Surgeon: Lafayette Dragon, MD;  Location[facility] ENDOSCOPY;  Service: Endoscopy;  Laterality: N/A;  . COLONOSCOPY WITH PROPOFOL N/A 12/16/2019   Procedure: COLONOSCOPY WITH PROPOFOL;  Surgeon: Ladene Artist, MD;  Location: [facility] ENDOSCOPY;  Service: Endoscopy;  Laterality: N/A;  . ESOPHAGOGASTRODUODENOSCOPY  10/12/2011   Procedure: ESOPHAGOGASTRODUODENOSCOPY (EGD);  Surgeon: Lafayette Dragon, MD;  Location: [facility] ENDOSCOPY;  Service: Endoscopy;  Laterality: N/A;  . ESOPHAGOGASTRODUODENOSCOPY (EGD) WITH PROPOFOL N/A 12/16/2019   Procedure: ESOPHAGOGASTRODUODENOSCOPY (EGD) WITH PROPOFOL;  Surgeon: Ladene Artist, MD;  Location: [facility] ENDOSCOPY;  Service: Endoscopy;  Laterality: N/A;  . HEMOSTASIS CLIP PLACEMENT  12/16/2019   Procedure: HEMOSTASIS CLIP PLACEMENT;  Surgeon: Ladene Artist, MD;  Location: [facility] ENDOSCOPY;  Service: Endoscopy;;  . HERNIA REPAIR  2007   "navel"  . JOINT REPLACEMENT    . KNEE CARTILAGE SURGERY  1960's and 1970's   left; "2 total"  . POLYPECTOMY  12/16/2019   Procedure: POLYPECTOMY;  Surgeon: Ladene Artist, MD;  Location: [facility] ENDOSCOPY;  Service: Endoscopy;;  . TOTAL HIP ARTHROPLASTY  2007       Family History  Problem Relation Age of Onset  . Alcohol abuse Other        grandparent  . Arthritis Other        arthritis  . Diabetes Other  grandparent    Social History   Tobacco Use  . Smoking status: Former Smoker    Packs/day: 0.30    Years: 15.00    Pack years: 4.50    Types: Cigarettes    Quit date: 10/11/1978    Years since quitting: 41.2  . Smokeless tobacco: Never Used  Vaping Use  . Vaping Use: Never used  Substance Use Topics  . Alcohol use: Yes    Comment: 10/11/11 "last alcohol 27-28 years ago"  . Drug use: Yes    Types: Marijuana    Comment: "recreational marijuana in [employer]; 1970's"    Home Medications Prior to Admission medications   Medication Sig Start Date End  Date Taking? Authorizing Provider  aspirin EC 81 MG EC tablet Take 1 tablet (81 mg total) by mouth daily. Swallow whole. 12/18/19   Oswald Hillock, MD  atorvastatin (LIPITOR) 20 MG tablet Take 1 tablet (20 mg total) by mouth daily. 01/16/19   Burchette, Alinda Sierras, MD  gabapentin (NEURONTIN) 100 MG capsule Take 2 capsules (200 mg total) by mouth 2 (two) times daily. 01/16/19   Burchette, Alinda Sierras, MD  Insulin Glargine [facility] KWIKPEN) 100 UNIT/ML INJECT 30 UNITS SUBCUTANEOUSLY AT BEDTIME 12/26/19   Burchette, Alinda Sierras, MD  lisinopril (ZESTRIL) 20 MG tablet Take 1 tablet (20 mg total) by mouth daily. 11/20/19   Burchette, Alinda Sierras, MD  metFORMIN (GLUCOPHAGE) 500 MG tablet Take two tablets twice daily. Patient taking differently: Take 1,000 mg by mouth 2 (two) times daily with a meal. Take two tablets twice daily. 04/20/19   Burchette, Alinda Sierras, MD  pantoprazole (PROTONIX) 40 MG tablet Take 1 tablet (40 mg total) by mouth daily at 6 (six) AM. 12/18/19   Oswald Hillock, MD    Allergies    Patient has no known allergies.  Review of Systems   Review of Systems  Gastrointestinal: Positive for Carl in stool.  All other systems reviewed and are negative.   Physical Exam Updated Vital Signs BP 134/83 (BP Location: Right Arm)   Pulse 76   Temp 98.5 F (36.9 C) (Oral)   Resp 18   Ht 6\' 1"  (1.854 m)   Wt 108.4 kg   SpO2 99%   BMI 31.53 kg/m   Physical Exam Vitals and nursing note reviewed.  Constitutional:      Appearance: He is well-developed.  HENT:     Head: Normocephalic and atraumatic.  Eyes:     Conjunctiva/sclera: Conjunctivae normal.     Pupils: Pupils are equal, round, and reactive to light.  Cardiovascular:     Rate and Rhythm: Normal rate and regular rhythm.     Heart sounds: Normal heart sounds.  Pulmonary:     Effort: Pulmonary effort is normal. No respiratory distress.     Breath sounds: Normal breath sounds. No rhonchi.  Abdominal:     General: Bowel sounds are normal.      Palpations: Abdomen is soft.     Tenderness: There is no abdominal tenderness. There is no rebound.     Comments: Soft, non-tender, actively passing gas during exam  Genitourinary:    Comments: Exam chaperoned by RN Gross red Carl noted on DRE with intermixed loose stool Musculoskeletal:        General: Normal range of motion.     Cervical back: Normal range of motion.  Skin:    General: Skin is warm and dry.  Neurological:     Mental Status: He is alert and  oriented to person, place, and time.     ED Results / Procedures / Treatments   Labs (all labs ordered are listed, but only abnormal results are displayed) Labs Reviewed  COMPREHENSIVE METABOLIC PANEL - Abnormal; Notable for the following components:      Result Value   Glucose, Bld 172 (*)    Creatinine, Ser 1.52 (*)    GFR calc non Af Amer 45 (*)    GFR calc Af Amer 52 (*)    All other components within normal limits  CBC - Abnormal; Notable for the following components:   RBC 3.50 (*)    Hemoglobin 10.0 (*)    HCT 31.4 (*)    All other components within normal limits  POC OCCULT Carl, ED - Abnormal; Notable for the following components:   Fecal Occult Bld POSITIVE (*)    All other components within normal limits  RESPIRATORY PANEL BY RT PCR (FLU A&B, COVID)  TYPE AND SCREEN    EKG None  Radiology No results found.  Procedures Procedures (including critical care time)  CRITICAL CARE Performed by: Larene Pickett   Total critical care time: 45 minutes  Critical care time was exclusive of separately billable procedures and treating other patients.  Critical care was necessary to treat or prevent imminent or life-threatening deterioration.  Critical care was time spent personally by me on the following activities: development of treatment plan with patient and/or surrogate as well as nursing, discussions with consultants, evaluation of patient's response to treatment, examination of patient, obtaining  history from patient or surrogate, ordering and performing treatments and interventions, ordering and review of laboratory studies, ordering and review of radiographic studies, pulse oximetry and re-evaluation of patient's condition.   Medications Ordered in ED Medications  pantoprazole (PROTONIX) 80 mg in sodium chloride 0.9 % 100 mL (0.8 mg/mL) infusion (8 mg/hr Intravenous New Bag/Given 01/07/20 2325)  pantoprazole (PROTONIX) injection 40 mg (has no administration in time range)  0.9 %  sodium chloride infusion ( Intravenous New Bag/Given 01/07/20 2309)  pantoprazole (PROTONIX) 80 mg in sodium chloride 0.9 % 100 mL IVPB (0 mg Intravenous Stopped 01/07/20 2339)    ED Course  I have reviewed the triage vital signs and the nursing notes.  Pertinent labs & imaging results that were available during my care of the patient were reviewed by me and considered in my medical decision making (see chart for details).    MDM Rules/Calculators/A&P    74 y.o. M here with recurrent GI bleeding.  Recent admission for same, currently on daily ASA due to recent CVA.  Denies abdominal pain, just urge to have frequent BM and feeling gassy.  He is afebrile, non-toxic,  HD stable.  Abdomen soft, non-tender.  Does have gross red Carl on DRE.  Labs reassuring, hemoglobin stable.  Family did call GI office earlier today, will speak with them about recommendation given recent colonoscopy w/polypectomy.  Will re-start IV protonix.  Plan for admission.  10:59 PM  Spoke with GI physician on call, Dr. Rush Landmark-- given exam findings, stable hgb and BP, and no heavy bleeding currently recommends to monitor throughout the night, obtain CTA if begins to have heavy bleeding or lots of bloody stools.  Consider giving gentle IVF in case he does need CTA. He will be seen in the AM by GI team.  Spoke with hospitalist, Dr. Jonelle Sidle-- we have reviewed recommendations from GI.  He will admit for ongoing care.  Final Clinical  Impression(s) / ED  Diagnoses Final diagnoses:  Lower GI bleed    Rx / DC Orders ED Discharge Orders    None       Larene Pickett, PA-C 01/07/20 2346    Lacretia Leigh, MD 01/11/20 [phone]

## 2020-01-07 NOTE — Telephone Encounter (Signed)
Thank you for update. I have reviewed chart. He is not at ED as of yet. Query Diverticular bleeding vs less likely though not impossible post-polypectomy bleeding (though many weeks since procedure). If they call, will ask to consider role of CT-A if having active ongoing bleeding. Otherwise monitor may be reasonable and supportive management. Discussions about potential endoscopic evaluation will need to be considered as well.  Justice Britain, MD [facility] Office # [phone]

## 2020-01-07 NOTE — ED Triage Notes (Signed)
Pt reports bright red and dark colored rectal bleeding that started today.  Denies pain or weakness.

## 2020-01-07 NOTE — H&P (Signed)
History and Physical   Carl Lam Carl Lam DOB: May 14, 1945 DOA: 01/07/2020  Referring MD/NP/PA: Dr. Darl Householder  PCP: Eulas Post, MD   Outpatient Specialists: Dr. Lucio Edward, GI  Patient coming from: Home  Chief Complaint: Rectal bleed  HPI: Carl Lam is a 74 y.o. male with medical history significant of recent GI bleed with polypectomy done, hypertension, hyperlipidemia, diabetes, morbid obesity, diverticular disease who presented to the ER with 4 episodes of rectal bleeding today.  Patient was recently in the hospital from August 30 through September 2.  During that.  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  Patient also had noted diverticular bleed.  He has been doing fine at home until today when he started having these rectal bleeds.  They were painless in nature.  He was not dizzy and hemodynamically stable.  Bleeding was stool mixed with blood.  Came to the ER where he was evaluated.  Patient reported eating a lot of: And has eaten a lot of food with seeds.  He is not aware of the fact that this could exacerbate his diverticular disease.  He denied any fever or chills no nausea vomiting and no hematemesis.  GI has been consulted and recommends observation and possible  CTA if bleeding continues..  ED Course: Temperature is 98.8 blood pressure 174/104 pulse 99 respiratory rate 18 oxygen sat 96% on room air.  Chemistry appear to be largely within normal except for creatinine 1.52.  Hemoglobin is 10.0 and his discharge hemoglobin was 10.8.  Glucose 172.  COVID-19 screen is negative and fecal occult blood testing was positive x2.  Patient being admitted for further work-up.  Review of Systems: As per HPI otherwise 10 point review of systems negative.    Past Medical History:  Diagnosis Date  . Acute lower GI bleeding 10/11/11  . DEGENERATIVE JOINT DISEASE 10/27/2008   Qualifier: Diagnosis of   By: Elease Hashimoto MD, Bruce    . DIAB W/O COMP TYPE II/UNS NOT STATED UNCNTRL 10/27/2008   Qualifier: Diagnosis of  By: Valma Cava LPN, Izora Gala    . HYPERLIPIDEMIA 04/27/2009   Qualifier: Diagnosis of  By: Elease Hashimoto MD, Bruce    . HYPERTENSION 10/27/2008   Qualifier: Diagnosis of  By: Valma Cava LPN, Izora Gala      Past Surgical History:  Procedure Laterality Date  . COLONOSCOPY  10/12/2011   Procedure: COLONOSCOPY;  Surgeon: Lafayette Dragon, MD;  Location: [facility] ENDOSCOPY;  Service: Endoscopy;  Laterality: N/A;  . COLONOSCOPY WITH PROPOFOL N/A 12/16/2019   Procedure: COLONOSCOPY WITH PROPOFOL;  Surgeon: Ladene Artist, MD;  Location[facility]COPY;  Service: Endoscopy;  Laterality: N/A;  . ESOPHAGOGASTRODUODENOSCOPY  10/12/2011   Procedure: ESOPHAGOGASTRODUODENOSCOPY (EGD);  Surgeon: Lafayette Dragon, MD;  Location: [facility] ENDOSCOPY;  Service: Endoscopy;  Laterality: N/A;  . ESOPHAGOGASTRODUODENOSCOPY (EGD) WITH PROPOFOL N/A 12/16/2019   Procedure: ESOPHAGOGASTRODUODENOSCOPY (EGD) WITH PROPOFOL;  Surgeon: Ladene Artist, MD;  Location: [facility] ENDOSCOPY;  Service: Endoscopy;  Laterality: N/A;  . HEMOSTASIS CLIP PLACEMENT  12/16/2019   Procedure: HEMOSTASIS CLIP PLACEMENT;  Surgeon: Ladene Artist, MD;  Location: [facility] ENDOSCOPY;  Service: Endoscopy;;  . HERNIA REPAIR  2007   "navel"  . JOINT REPLACEMENT    . KNEE CARTILAGE SURGERY  1960's and 1970's   left; "2 total"  . POLYPECTOMY  12/16/2019   Procedure: POLYPECTOMY;  Surgeon: Ladene Artist, MD;  Location: [facility] ENDOSCOPY;  Service: Endoscopy;;  .  TOTAL HIP ARTHROPLASTY  2007     reports that he quit smoking about 41 years ago. His smoking use included cigarettes. He has a 4.50 pack-year smoking history. He has never used smokeless tobacco. He reports current alcohol use. He reports current drug use. Drug: Marijuana.  No Known Allergies  Family History  Problem Relation Age of Onset  . Alcohol abuse Other        grandparent  . Arthritis Other        arthritis  .  Diabetes Other        grandparent     Prior to Admission medications   Medication Sig Start Date End Date Taking? Authorizing Provider  aspirin EC 81 MG EC tablet Take 1 tablet (81 mg total) by mouth daily. Swallow whole. 12/18/19  Yes Oswald Hillock, MD  atorvastatin (LIPITOR) 20 MG tablet Take 1 tablet (20 mg total) by mouth daily. 01/16/19  Yes Burchette, Alinda Sierras, MD  gabapentin (NEURONTIN) 100 MG capsule Take 2 capsules (200 mg total) by mouth 2 (two) times daily. 01/16/19  Yes Burchette, Alinda Sierras, MD  Insulin Glargine (BASAGLAR KWIKPEN) 100 UNIT/ML INJECT 30 UNITS SUBCUTANEOUSLY AT BEDTIME Patient taking differently: Inject 30 Units into the skin daily.  12/26/19  Yes Burchette, Alinda Sierras, MD  lisinopril (ZESTRIL) 20 MG tablet Take 1 tablet (20 mg total) by mouth daily. 11/20/19  Yes Burchette, Alinda Sierras, MD  metFORMIN (GLUCOPHAGE) 500 MG tablet Take two tablets twice daily. Patient taking differently: Take 1,000 mg by mouth 2 (two) times daily with a meal. Take two tablets twice daily. 04/20/19  Yes Burchette, Alinda Sierras, MD  pantoprazole (PROTONIX) 40 MG tablet Take 1 tablet (40 mg total) by mouth daily at 6 (six) AM. 12/18/19  Yes Oswald Hillock, MD    Physical Exam: Vitals:   01/07/20 1750 01/07/20 1844 01/07/20 2110 01/07/20 2238  BP: (!) 174/104 (!) 154/92 (!) 162/76 134/83  Pulse: 99 88 82 76  Resp: 16 18 18 18   Temp: 98.6 F (37 C)  98.8 F (37.1 C) 98.5 F (36.9 C)  TempSrc: Oral  Oral Oral  SpO2: 100% 100% 97% 99%  Weight: 108.4 kg     Height: 6\' 1"  (1.854 m)         Constitutional: Obese no distress Vitals:   01/07/20 1750 01/07/20 1844 01/07/20 2110 01/07/20 2238  BP: (!) 174/104 (!) 154/92 (!) 162/76 134/83  Pulse: 99 88 82 76  Resp: 16 18 18 18   Temp: 98.6 F (37 C)  98.8 F (37.1 C) 98.5 F (36.9 C)  TempSrc: Oral  Oral Oral  SpO2: 100% 100% 97% 99%  Weight: 108.4 kg     Height: 6\' 1"  (1.854 m)      Eyes: PERRL, lids and conjunctivae normal ENMT: Mucous membranes  are moist. Posterior pharynx clear of any exudate or lesions.Normal dentition.  Neck: normal, supple, no masses, no thyromegaly Respiratory: clear to auscultation bilaterally, no wheezing, no crackles. Normal respiratory effort. No accessory muscle use.  Cardiovascular: Regular rate and rhythm, no murmurs / rubs / gallops. No extremity edema. 2+ pedal pulses. No carotid bruits.  Abdomen: no tenderness, no masses palpated. No hepatosplenomegaly. Bowel sounds positive.  Musculoskeletal: no clubbing / cyanosis. No joint deformity upper and lower extremities. Good ROM, no contractures. Normal muscle tone.  Skin: no rashes, lesions, ulcers. No induration Neurologic: CN 2-12 grossly intact. Sensation intact, DTR normal. Strength 5/5 in all 4.  Psychiatric: Normal judgment and insight. Alert  and oriented x 3. Normal mood.     Labs on Admission: I have personally reviewed following labs and imaging studies  CBC: Recent Labs  Lab 01/06/20 0905 01/07/20 1810  WBC 7.1 7.5  NEUTROABS 4,927  --   HGB 10.8* 10.0*  HCT 34.3* 31.4*  MCV 90.7 89.7  PLT 375 676   Basic Metabolic Panel: Recent Labs  Lab 01/06/20 0905 01/07/20 1810  NA 140 139  K 4.7 4.1  CL 106 106  CO2 25 23  GLUCOSE 101* 172*  BUN 25 19  CREATININE 1.56* 1.52*  CALCIUM 9.6 8.9   GFR: Estimated Creatinine Clearance: 55.9 mL/min (A) (by C-G formula based on SCr of 1.52 mg/dL (H)). Liver Function Tests: Recent Labs  Lab 01/07/20 1810  AST 16  ALT 12  ALKPHOS 52  BILITOT 0.6  PROT 7.2  ALBUMIN 3.8   No results for input(s): LIPASE, AMYLASE in the last 168 hours. No results for input(s): AMMONIA in the last 168 hours. Coagulation Profile: No results for input(s): INR, PROTIME in the last 168 hours. Cardiac Enzymes: No results for input(s): CKTOTAL, CKMB, CKMBINDEX, TROPONINI in the last 168 hours. BNP (last 3 results) No results for input(s): PROBNP in the last 8760 hours. HbA1C: No results for input(s):  HGBA1C in the last 72 hours. CBG: No results for input(s): GLUCAP in the last 168 hours. Lipid Profile: No results for input(s): CHOL, HDL, LDLCALC, TRIG, CHOLHDL, LDLDIRECT in the last 72 hours. Thyroid Function Tests: No results for input(s): TSH, T4TOTAL, FREET4, T3FREE, THYROIDAB in the last 72 hours. Anemia Panel: No results for input(s): VITAMINB12, FOLATE, FERRITIN, TIBC, IRON, RETICCTPCT in the last 72 hours. Urine analysis: No results found for: COLORURINE, APPEARANCEUR, LABSPEC, PHURINE, GLUCOSEU, HGBUR, BILIRUBINUR, KETONESUR, PROTEINUR, UROBILINOGEN, NITRITE, LEUKOCYTESUR Sepsis Labs: @LABRCNTIP (procalcitonin:4,lacticidven:4) )No results found for this or any previous visit (from the past 240 hour(s)).   Radiological Exams on Admission: No results found.    Assessment/Plan Principal Problem:   GI bleed Active Problems:   Type 2 diabetes mellitus with hyperglycemia (HCC)   Essential hypertension   Anemia due to blood loss, acute   Diverticulosis     #1 lower GI bleed: Most likely diverticular bleed based on patient's history.  He has recently eaten a lot of hard nuts and seeds.  He has no pain so I suspect this is simple diverticular bleed.  Patient reported having polypectomy done before but I doubt this is related.  We will admit the patient and do serial H&H.  IV Protonix.  GI has been consulted.  Hydrate the patient in case we need to do CT with contrast.  If bleeding stops patient likely to be discharged home.  I have discussed high-fiber diet and avoiding any foods with seeds and may be nuts.  Discussed also with the wife and advised her on his diet.  #2 diabetes: Initiate and continue sliding scale insulin.  #3 hypertension: We will resume his home regimen if no evidence of active bleeding.  #4 anemia of acute blood loss: Again hemoglobin is close to his discharge hemoglobin from earlier this month.  We will monitor H&H.  If it drops further patient will be  transfused.   DVT prophylaxis: SCD Code Status: Full code Family Communication: Wife at bedside Disposition Plan: Home Consults called: Gastroenterology from [facility] Admission status: Inpatient  Severity of Illness: The appropriate patient status for this patient is INPATIENT. Inpatient status is judged to be reasonable and necessary in order to provide  the required intensity of service to ensure the patient's safety. The patient's presenting symptoms, physical exam findings, and initial radiographic and laboratory data in the context of their chronic comorbidities is felt to place them at high risk for further clinical deterioration. Furthermore, it is not anticipated that the patient will be medically stable for discharge from the hospital within 2 midnights of admission. The following factors support the patient status of inpatient.   " The patient's presenting symptoms include rectal bleed. " The worrisome physical exam findings include no tenderness. " The initial radiographic and laboratory data are worrisome because of fecal occult blood testing positive. " The chronic co-morbidities include diabetes hypertension and GI bleed.   * I certify that at the point of admission it is my clinical judgment that the patient will require inpatient hospital care spanning beyond 2 midnights from the point of admission due to high intensity of service, high risk for further deterioration and high frequency of surveillance required.Barbette Merino MD [facility] Pager [phone]  If 7PM-7AM, please contact night-coverage [email or web address] Password [facility]  01/07/2020, 11:36 PM

## 2020-01-07 NOTE — Telephone Encounter (Signed)
Patient reports that he was recently admitted with GI bleed and CVA.   He reports that today he began having rectal bleeding. Blood is dark,  Passing a significant amount with each BM and have had several this afternoon.  He is advised to go to the ED for eval now.  He is advised to not drive himself.  He verbalized  understanding and will go to [facility] ED now.    Dr. Rush Landmark, you are on call this evening.  FYI.

## 2020-01-07 NOTE — Telephone Encounter (Signed)
Pt's wife Carl Lam called to inform that pt began passing blood with his bms today. Pt had procedures with Dr. Fuller Plan when he was hospitalized. However, he is scheduled for hosp f/u with Dr. Havery Moros so I am not sure of who to route this message. Pls route it to the correct nurse if Dr. Fuller Plan is not pt's MD. Thank you.

## 2020-01-08 DIAGNOSIS — Z86010 Personal history of colonic polyps: Secondary | ICD-10-CM | POA: Diagnosis not present

## 2020-01-08 DIAGNOSIS — K5793 Diverticulitis of intestine, part unspecified, without perforation or abscess with bleeding: Secondary | ICD-10-CM

## 2020-01-08 DIAGNOSIS — K922 Gastrointestinal hemorrhage, unspecified: Secondary | ICD-10-CM | POA: Diagnosis not present

## 2020-01-08 DIAGNOSIS — Z794 Long term (current) use of insulin: Secondary | ICD-10-CM

## 2020-01-08 DIAGNOSIS — I10 Essential (primary) hypertension: Secondary | ICD-10-CM | POA: Diagnosis not present

## 2020-01-08 DIAGNOSIS — E1165 Type 2 diabetes mellitus with hyperglycemia: Secondary | ICD-10-CM

## 2020-01-08 DIAGNOSIS — D649 Anemia, unspecified: Secondary | ICD-10-CM

## 2020-01-08 DIAGNOSIS — K5790 Diverticulosis of intestine, part unspecified, without perforation or abscess without bleeding: Secondary | ICD-10-CM | POA: Diagnosis not present

## 2020-01-08 DIAGNOSIS — D62 Acute posthemorrhagic anemia: Secondary | ICD-10-CM | POA: Diagnosis not present

## 2020-01-08 LAB — GLUCOSE, CAPILLARY
Glucose-Capillary: 102 mg/dL — ABNORMAL HIGH (ref 70–99)
Glucose-Capillary: 103 mg/dL — ABNORMAL HIGH (ref 70–99)
Glucose-Capillary: 91 mg/dL (ref 70–99)
Glucose-Capillary: 95 mg/dL (ref 70–99)
Glucose-Capillary: 98 mg/dL (ref 70–99)

## 2020-01-08 LAB — COMPREHENSIVE METABOLIC PANEL
ALT: 10 U/L (ref 0–44)
AST: 11 U/L — ABNORMAL LOW (ref 15–41)
Albumin: 3.3 g/dL — ABNORMAL LOW (ref 3.5–5.0)
Alkaline Phosphatase: 38 U/L (ref 38–126)
Anion gap: 8 (ref 5–15)
BUN: 17 mg/dL (ref 8–23)
CO2: 24 mmol/L (ref 22–32)
Calcium: 8.3 mg/dL — ABNORMAL LOW (ref 8.9–10.3)
Chloride: 110 mmol/L (ref 98–111)
Creatinine, Ser: 1.39 mg/dL — ABNORMAL HIGH (ref 0.61–1.24)
GFR calc Af Amer: 58 mL/min — ABNORMAL LOW (ref >60)
GFR calc non Af Amer: 50 mL/min — ABNORMAL LOW (ref >60)
Glucose, Bld: 113 mg/dL — ABNORMAL HIGH (ref 70–99)
Potassium: 4.2 mmol/L (ref 3.5–5.1)
Sodium: 142 mmol/L (ref 135–145)
Total Bilirubin: 0.5 mg/dL (ref 0.3–1.2)
Total Protein: 5.9 g/dL — ABNORMAL LOW (ref 6.5–8.1)

## 2020-01-08 LAB — CBC
HCT: 25.3 % — ABNORMAL LOW (ref 39.0–52.0)
HCT: 28.1 % — ABNORMAL LOW (ref 39.0–52.0)
HCT: 23.6 % — ABNORMAL LOW (ref 39.0–52.0)
Hemoglobin: 8.1 g/dL — ABNORMAL LOW (ref 13.0–17.0)
Hemoglobin: 8.9 g/dL — ABNORMAL LOW (ref 13.0–17.0)
Hemoglobin: 7.6 g/dL — ABNORMAL LOW (ref 13.0–17.0)
MCH: 28.2 pg (ref 26.0–34.0)
MCH: 27.8 pg (ref 26.0–34.0)
MCH: 27.8 pg (ref 26.0–34.0)
MCHC: 32 g/dL (ref 30.0–36.0)
MCHC: 31.7 g/dL (ref 30.0–36.0)
MCHC: 32.2 g/dL (ref 30.0–36.0)
MCV: 88.2 fL (ref 80.0–100.0)
MCV: 87.8 fL (ref 80.0–100.0)
MCV: 86.4 fL (ref 80.0–100.0)
Platelets: 327 10*3/uL (ref 150–400)
Platelets: 332 10*3/uL (ref 150–400)
Platelets: 297 10*3/uL (ref 150–400)
RBC: 2.87 MIL/uL — ABNORMAL LOW (ref 4.22–5.81)
RBC: 3.2 MIL/uL — ABNORMAL LOW (ref 4.22–5.81)
RBC: 2.73 MIL/uL — ABNORMAL LOW (ref 4.22–5.81)
RDW: 14.7 % (ref 11.5–15.5)
RDW: 14.7 % (ref 11.5–15.5)
RDW: 14.8 % (ref 11.5–15.5)
WBC: 5.8 10*3/uL (ref 4.0–10.5)
WBC: 6.2 10*3/uL (ref 4.0–10.5)
WBC: 5.9 10*3/uL (ref 4.0–10.5)
nRBC: 0 % (ref 0.0–0.2)
nRBC: 0 % (ref 0.0–0.2)
nRBC: 0 % (ref 0.0–0.2)

## 2020-01-08 LAB — RESPIRATORY PANEL BY RT PCR (FLU A&B, COVID)
Influenza A by PCR: NEGATIVE
Influenza B by PCR: NEGATIVE
SARS Coronavirus 2 by RT PCR: NEGATIVE

## 2020-01-08 MED ORDER — ONDANSETRON HCL 4 MG PO TABS: 4.0000 mg | ORAL_TABLET | Freq: Four times a day (QID) | ORAL | Status: DC | PRN

## 2020-01-08 MED ORDER — PANTOPRAZOLE SODIUM 40 MG IV SOLR: 40.0000 mg | Freq: Two times a day (BID) | INTRAVENOUS | Status: DC

## 2020-01-08 MED ORDER — SODIUM CHLORIDE 0.9 % IV SOLN: INTRAVENOUS | Status: DC

## 2020-01-08 MED ORDER — LISINOPRIL 20 MG PO TABS
20.0000 mg | ORAL_TABLET | Freq: Every day | ORAL | Status: DC
  Administered 2020-01-08 – 2020-01-09 (×2): 20 mg via ORAL
  Filled 2020-01-08 (×2): qty 1

## 2020-01-08 MED ORDER — PANTOPRAZOLE SODIUM 40 MG PO TBEC
40.0000 mg | DELAYED_RELEASE_TABLET | Freq: Every day | ORAL | Status: DC
  Administered 2020-01-09: 40 mg via ORAL
  Filled 2020-01-08: qty 1

## 2020-01-08 MED ORDER — ATORVASTATIN CALCIUM 10 MG PO TABS
20.0000 mg | ORAL_TABLET | Freq: Every day | ORAL | Status: DC
  Administered 2020-01-08 – 2020-01-09 (×2): 20 mg via ORAL
  Filled 2020-01-08 (×2): qty 2

## 2020-01-08 MED ORDER — ONDANSETRON HCL 4 MG/2ML IJ SOLN: 4.0000 mg | Freq: Four times a day (QID) | INTRAMUSCULAR | Status: DC | PRN

## 2020-01-08 MED ORDER — SODIUM CHLORIDE 0.9 % IV SOLN: 80.0000 mg | Freq: Once | INTRAVENOUS | Status: DC

## 2020-01-08 MED ORDER — INSULIN ASPART 100 UNIT/ML ~~LOC~~ SOLN
0.0000 [IU] | Freq: Three times a day (TID) | SUBCUTANEOUS | Status: DC
  Administered 2020-01-09: 1 [IU] via SUBCUTANEOUS

## 2020-01-08 MED ORDER — SODIUM CHLORIDE 0.9 % IV SOLN: 8.0000 mg/h | INTRAVENOUS | Status: DC

## 2020-01-08 MED ORDER — GABAPENTIN 100 MG PO CAPS
200.0000 mg | ORAL_CAPSULE | Freq: Two times a day (BID) | ORAL | Status: DC
  Administered 2020-01-08 – 2020-01-09 (×3): 200 mg via ORAL
  Filled 2020-01-08 (×3): qty 2

## 2020-01-08 NOTE — Consult Note (Signed)
Consultation  Referring Provider:  [facility] Primary Care Physician:  Eulas Post, MD Primary Gastroenterologist:  Dr. Havery Moros  Reason for Consultation:   GI bleed  HPI: Carl Lam is a 74 y.o. male, known to GI service, who we are asked to see regarding recurrent GI bleeding. Patient has history of diverticular bleed in 2013, he was recently admitted 8/30 through 12/17/2019 with acute lower GI bleed.  He underwent EGD and colonoscopy during that admission.  EGD was normal and a colonoscopy was noted to have multiple diverticuli in the left colon with narrowing in the area of the diverticuli, there were a few diverticuli in the right colon.  He had 2 polyps removed, an 8 mm polyp in the left colon and a 10 mm polyp in the rectum which did have some persistent oozing and was endoclipped.  Path on both of the polyps consistent with tubular adenomas. Patient had  hemoglobin of 10.8 on discharge 12/17/2019. He had onset of recurrent rectal bleeding yesterday and presented to the emergency room.  He relates several episodes of dark red bloody bowel movements prior to presenting to the ER.  He had 2 bloody bowel movements during the night, the last bowel movement was about 4 hours ago.  He thinks that may have been less volume.  He said he had an unsettled gassy sensation in his abdomen all day yesterday which is not there this morning.  No associated diaphoresis lightheadedness shortness of breath etc. Hemoglobin was 10 on admission, down to 8.1 this a.m.  Other medical issues include hypertension, hyperlipidemia, morbid obesity, adult onset diabetes mellitus. 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 Acute lower GI bleeding 10/11/11    . DEGENERATIVE JOINT DISEASE 10/27/2008   Qualifier: Diagnosis of  By: Elease Hashimoto MD, Bruce    . DIAB W/O COMP TYPE II/UNS NOT STATED UNCNTRL 10/27/2008   Qualifier: Diagnosis of  By: Valma Cava LPN, Izora Gala    . HYPERLIPIDEMIA 04/27/2009   Qualifier: Diagnosis of  By: Elease Hashimoto MD, Bruce    . HYPERTENSION 10/27/2008   Qualifier: Diagnosis of  By: Valma Cava LPN, Izora Gala      Past Surgical History:  Procedure Laterality Date  . COLONOSCOPY  10/12/2011   Procedure: COLONOSCOPY;  Surgeon: Lafayette Dragon, MD;  Location: [facility] ENDOSCOPY;  Service: Endoscopy;  Laterality: N/A;  . COLONOSCOPY WITH PROPOFOL N/A 12/16/2019   Procedure: COLONOSCOPY WITH PROPOFOL;  Surgeon: Ladene Artist, MD;  Location: [facility] ENDOSCOPY;  Service: Endoscopy;  Laterality: N/A;  . ESOPHAGOGASTRODUODENOSCOPY  10/12/2011   Procedure: ESOPHAGOGASTRODUODENOSCOPY (EGD);  Surgeon: Lafayette Dragon, MD;  Location: [facility] ENDOSCOPY;  Service: Endoscopy;  Laterality: N/A;  . ESOPHAGOGASTRODUODENOSCOPY (EGD) WITH PROPOFOL N/A 12/16/2019   Procedure: ESOPHAGOGASTRODUODENOSCOPY (EGD) WITH PROPOFOL;  Surgeon: Ladene Artist, MD;  Location: [facility] ENDOSCOPY;  Service: Endoscopy;  Laterality: N/A;  . HEMOSTASIS CLIP PLACEMENT  12/16/2019   Procedure: HEMOSTASIS CLIP PLACEMENT;  Surgeon: Ladene Artist, MD;  Location: [facility] ENDOSCOPY;  Service: Endoscopy;;  . HERNIA REPAIR  2007   "navel"  . JOINT REPLACEMENT    . KNEE CARTILAGE SURGERY  1960's and 1970's   left; "2 total"  . POLYPECTOMY  12/16/2019   Procedure: POLYPECTOMY;  Surgeon: Ladene Artist, MD;  Location: [facility] ENDOSCOPY;  Service: Endoscopy;;  . TOTAL HIP ARTHROPLASTY  2007    Prior to Admission medications   Medication Sig Start Date End Date Taking? Authorizing Provider  aspirin EC 81 MG EC tablet Take 1 tablet (81 mg total) by mouth daily. Swallow whole. 12/18/19  Yes Oswald Hillock, MD  atorvastatin (LIPITOR) 20 MG tablet Take 1 tablet (20 mg total) by mouth daily. 01/16/19  Yes Burchette, Alinda Sierras, MD   gabapentin (NEURONTIN) 100 MG capsule Take 2 capsules (200 mg total) by mouth 2 (two) times daily. 01/16/19  Yes Burchette, Alinda Sierras, MD  Insulin Glargine (BASAGLAR KWIKPEN) 100 UNIT/ML INJECT 30 UNITS SUBCUTANEOUSLY AT BEDTIME Patient taking differently: Inject 30 Units into the skin daily.  12/26/19  Yes Burchette, Alinda Sierras, MD  lisinopril (ZESTRIL) 20 MG tablet Take 1 tablet (20 mg total) by mouth daily. 11/20/19  Yes Burchette, Alinda Sierras, MD  metFORMIN (GLUCOPHAGE) 500 MG tablet Take two tablets twice daily. Patient taking differently: Take 1,000 mg by mouth 2 (two) times daily with a meal. Take two tablets twice daily. 04/20/19  Yes Burchette, Alinda Sierras, MD  pantoprazole (PROTONIX) 40 MG tablet Take 1 tablet (40 mg total) by mouth daily at 6 (six) AM. 12/18/19  Yes Darrick Meigs, Marge Duncans, MD    Current Facility-Administered Medications  Medication Dose Route Frequency Provider Last Rate Last Admin  . 0.9 %  sodium chloride infusion   Intravenous Continuous Larene Pickett, PA-C   Stopped at 01/08/20 0054  . 0.9 %  sodium chloride infusion   Intravenous Continuous Elwyn Reach, MD 125 mL/hr at 01/08/20 0624 New Bag at 01/08/20 8786  . insulin aspart (novoLOG) injection 0-6 Units  0-6 Units Subcutaneous TID WC Garba, Mohammad L, MD      . ondansetron (ZOFRAN) tablet 4 mg  4 mg Oral Q6H PRN Elwyn Reach, MD       Or  . ondansetron (ZOFRAN) injection 4 mg  4 mg Intravenous Q6H PRN Gala Romney L, MD      . pantoprazole (PROTONIX) 80 mg in sodium chloride 0.9 % 100 mL (0.8 mg/mL) infusion  8 mg/hr Intravenous Continuous Larene Pickett, PA-C 10 mL/hr at 01/08/20 0816 8 mg/hr at 01/08/20 0816  . [START ON 01/11/2020] pantoprazole (PROTONIX) injection 40 mg  40 mg Intravenous Q12H Larene Pickett, PA-C        Allergies as of 01/07/2020  . (No Known Allergies)    Family History  Problem Relation Age of Onset  . Alcohol abuse Other        grandparent  . Arthritis Other        arthritis  . Diabetes  Other        grandparent    Social History   Socioeconomic History  . Marital status: Married    Spouse name: Not on file  . Number of children: Not on file  . Years of education: Not on file  . Highest education level: Not on file  Occupational History  . Not on file  Tobacco Use  . Smoking status: Former Smoker    Packs/day: 0.30    Years: 15.00    Pack years: 4.50    Types: Cigarettes    Quit date: 10/11/1978    Years since quitting: 41.2  . Smokeless tobacco: Never Used  Vaping Use  . Vaping Use: Never used  Substance and Sexual Activity  . Alcohol use: Yes  Comment: 10/11/11 "last alcohol 27-28 years ago"  . Drug use: Yes    Types: Marijuana    Comment: "recreational marijuana in [employer]; 1970's"  . Sexual activity: Not Currently  Other Topics Concern  . Not on file  Social History Narrative  . Not on file   Social Determinants of Health   Financial Resource Strain:   . Difficulty of Paying Living Expenses: Not on file  Food Insecurity:   . Worried About Charity fundraiser in the Last Year: Not on file  . Ran Out of Food in the Last Year: Not on file  Transportation Needs:   . Lack of Transportation (Medical): Not on file  . Lack of Transportation (Non-Medical): Not on file  Physical Activity:   . Days of Exercise per Week: Not on file  . Minutes of Exercise per Session: Not on file  Stress:   . Feeling of Stress : Not on file  Social Connections:   . Frequency of Communication with Friends and Family: Not on file  . Frequency of Social Gatherings with Friends and Family: Not on file  . Attends Religious Services: Not on file  . Active Member of Clubs or Organizations: Not on file  . Attends Archivist Meetings: Not on file  . Marital Status: Not on file  Intimate Partner Violence:   . Fear of Current or Ex-Partner: Not on file  . Emotionally Abused: Not on file  . Physically Abused: Not on file  . Sexually Abused: Not on file     Review of Systems: Pertinent positive and negative review of systems were noted in the above HPI section.  All other review of systems was otherwise negative.  Physical Exam: Vital signs in last 24 hours: Temp:  [97.9 F (36.6 C)-98.8 F (37.1 C)] 97.9 F (36.6 C) (09/24 0836) Pulse Rate:  [70-99] 70 (09/24 0836) Resp:  [16-18] 18 (09/24 0836) BP: (121-174)/(76-104) 122/80 (09/24 0836) SpO2:  [96 %-100 %] 100 % (09/24 0836) Weight:  [106.8 kg-108.4 kg] 106.8 kg (09/24 0027) Last BM Date: 01/08/20 General:   Alert,  Well-developed, well-nourished, older African-American male pleasant and cooperative in NAD.  Wife at bedside Head:  Normocephalic and atraumatic. Eyes:  Sclera clear, no icterus.   Conjunctiva pale Ears:  Normal auditory acuity. Nose:  No deformity, discharge,  or lesions. Mouth:  No deformity or lesions.   Neck:  Supple; no masses or thyromegaly. Lungs:  Clear throughout to auscultation.   No wheezes, crackles, or rhonchi. Heart:  Regular rate and rhythm; no murmurs, clicks, rubs,  or gallops. Abdomen:  Soft,nontender, BS active,nonpalp mass or hsm.   Rectal:  Deferred  Msk:  Symmetrical without gross deformities. . Pulses:  Normal pulses noted. Extremities:  Without clubbing or edema. Neurologic:  Alert and  oriented x4;  grossly normal neurologically. Skin:  Intact without significant lesions or rashes.. Psych:  Alert and cooperative. Normal mood and affect.  Intake/Output from previous day: 09/23 0701 - 09/24 0700 In: 641.9 [I.V.:641.9] Out: 0  Intake/Output this shift: No intake/output data recorded.  Lab Results: Recent Labs    01/06/20 0905 01/07/20 1810 01/08/20 0844  WBC 7.1 7.5 5.8  HGB 10.8* 10.0* 8.1*  HCT 34.3* 31.4* 25.3*  PLT 375 375 327   BMET Recent Labs    01/06/20 0905 01/07/20 1810 01/08/20 0844  NA 140 139 142  K 4.7 4.1 4.2  CL 106 106 110  CO2 25 23 24   GLUCOSE 101* 172*  113*  BUN 25 19 17   CREATININE 1.56* 1.52*  1.39*  CALCIUM 9.6 8.9 8.3*   LFT Recent Labs    01/08/20 0844  PROT 5.9*  ALBUMIN 3.3*  AST 11*  ALT 10  ALKPHOS 38  BILITOT 0.5   PT/INR No results for input(s): LABPROT, INR in the last 72 hours. Hepatitis Panel No results for input(s): HEPBSAG, HCVAB, HEPAIGM, HEPBIGM in the last 72 hours.        IMPRESSION:  #5 74 year old African-American male with recurrent lower GI bleed.  Patient was hospitalized about 3 weeks ago with GI bleed, felt to be diverticular in origin.  He did undergo colonoscopy during that admission with multiple diverticuli in the left colon and a few diverticuli in the right colon.  He also had 2 polyps removed, largest was 10 mm in the rectum which was endoclipped. Patient has prior history of diverticular bleed 2013.  Onset of recurrent bleeding yesterday with multiple dark red bloody bowel movements.  Bleeding appears to be slowing/resolving this morning. History is consistent with recurrent diverticular bleeding. Less likely bleeding secondary to previous polypectomy site from the rectum which was endoclipped.  #2 anemia secondary to above #3 recent CVA-which she had been placed on baby aspirin #4 hypertension 5.  Adult onset diabetes mellitus 6.  Hyperlipidemia 7 adenomatous colon polyps.  PLAN: Clear liquids today Continue every 6 hour hemoglobins and transfuse for hemoglobin less than 7.5 Observe closely today, if he manifests further active hemorrhage will need CT angio and possible embolization, versus consideration for flexible sigmoidoscopy.  Hold aspirin short-term Thank you will follow with you   Livio Ledwith EsterwoodPA-C  01/08/2020, 10:34 AM

## 2020-01-08 NOTE — Plan of Care (Signed)
  Problem: Education: Goal: Knowledge of General Education information will improve Description Including pain rating scale, medication(s)/side effects and non-pharmacologic comfort measures Outcome: Progressing   

## 2020-01-08 NOTE — Plan of Care (Signed)
  Problem: Fluid Volume: Goal: Will show no signs and symptoms of excessive bleeding Outcome: Completed/Met   Problem: Education: Goal: Knowledge of General Education information will improve Description: Including pain rating scale, medication(s)/side effects and non-pharmacologic comfort measures Outcome: Completed/Met   Problem: Health Behavior/Discharge Planning: Goal: Ability to manage health-related needs will improve Outcome: Completed/Met   Problem: Clinical Measurements: Goal: Will remain free from infection Outcome: Completed/Met Goal: Diagnostic test results will improve Outcome: Completed/Met Goal: Respiratory complications will improve Outcome: Completed/Met Goal: Cardiovascular complication will be avoided Outcome: Completed/Met   Problem: Activity: Goal: Risk for activity intolerance will decrease Outcome: Completed/Met   Problem: Coping: Goal: Level of anxiety will decrease Outcome: Completed/Met

## 2020-01-08 NOTE — Progress Notes (Signed)
New Admission Note:   Arrival Method: from ED via stretcher Mental Orientation: Alert & oriented x4 Telemetry: 39m08, CCMD notified Assessment: Completed Skin: Intact, warm and dry IV: LAC, IVF infusing Pain: 0/10 Tubes: None Safety Measures: Safety Fall Prevention Plan has been discussed  Admission: completed 5 [place] Orientation: Patient has been orientated to the room, unit and staff.   Family: none at bedside  Orders to be reviewed and implemented. Will continue to monitor the patient. Call light has been placed within reach and bed alarm has been activated.

## 2020-01-08 NOTE — Progress Notes (Addendum)
Notified Gi number on cal, that pt had a dark red BM. Per PA [place], she wanted this RN to notify GI if pt had bloody BM. Let a message for the provider on call with the secretary.

## 2020-01-08 NOTE — Progress Notes (Signed)
PROGRESS NOTE    Carl Lam  GDJ:[id number] DOB: 02-18-1946 DOA: 01/07/2020 PCP: Eulas Post, MD   Brief Narrative:  Carl Lam is a 74 y.o. male with medical history significant of recent GI bleed with polypectomy done, hypertension, hyperlipidemia, diabetes, morbid obesity, diverticular disease who presented to the ER with 4 episodes of rectal bleeding today.  Patient was recently in the hospital from August 30 through September 2.  During that.  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  Patient also had noted diverticular bleed.  He has been doing fine at home until today when he started having these rectal bleeds.  They were painless in nature.  He was not dizzy and hemodynamically stable.  Bleeding was stool mixed with blood.  Came to the ER where he was evaluated.  Patient reported eating a lot of: And has eaten a lot of food with seeds.  He is not aware of the fact that this could exacerbate his diverticular disease.  He denied any fever or chills no nausea vomiting and no hematemesis.  GI has been consulted and recommends observation and possible  CTA if bleeding continues. In ED: Hemoglobin is 10.0 and his discharge hemoglobin was 10.8.  Fecal occult blood testing was positive x2.  Patient being admitted for further work-up.   Assessment & Plan:   Principal Problem:   GI bleed Active Problems:   Type 2 diabetes mellitus with hyperglycemia (HCC)   Essential hypertension   Anemia due to blood loss, acute   Diverticulosis   Acute lower GI bleed, painless, POA:  Most likely diverticular given patient's recent history and recent endoscopy FOBT positive x2 for maroon stool GI following, appreciate insight recommendations, given stable hemoglobin and no further episodes of bleeding agree with holding off on endoscopy at this time; CTA for possible localization and subsequent embolization  certainly reasonable if bleeding continues Continue clear liquid diet  Insulin-dependent diabetes type 2, controlled Lab Results  Component Value Date   HGBA1C 6.0 (H) 12/15/2019  Continue sliding scale insulin, hypoglycemic protocol while dietary changes are ongoing as above with clear liquids  Essential hypertension:  Resume home medications including lisinopril  DVT prophylaxis: SCD only Code Status: Full code Family Communication: None present  Status is: Observation  Dispo: The patient is from: Home              Anticipated d/c is to: Home              Anticipated d/c date is: 24 to 48 hours              Patient currently not medically stable for discharge given ongoing need for further monitoring, possible further imaging or intervention per GI  Consultants:   GI, lumbar  Procedures:   None planned  Antimicrobials:  None indicated  Subjective: No acute issues or events overnight denies nausea, vomiting, diarrhea, constipation, headache, fevers, chills.  No recurrent episodes of per rectum or maroon stool since admission  Objective: Vitals:   01/08/20 0000 01/08/20 0027 01/08/20 0447 01/08/20 0836  BP: 121/82 (!) 150/90 (!) 135/93 122/80  Pulse: 73 76 71 70  Resp: 17 18 18 18   Temp:  98.4 F (36.9 C) 98.7 F (37.1 C) 97.9 F (36.6 C)  TempSrc:  Oral  Oral  SpO2: 96% 100% 100% 100%  Weight:  106.8 kg    Height:  6\' 2"  (1.88  m)      Intake/Output Summary (Last 24 hours) at 01/08/2020 1316 Last data filed at 01/08/2020 0600 Gross per 24 hour  Intake 641.93 ml  Output 0 ml  Net 641.93 ml   Filed Weights   01/07/20 1750 01/08/20 0027  Weight: 108.4 kg 106.8 kg    Examination:  General:  Pleasantly resting in bed, No acute distress. HEENT:  Normocephalic atraumatic.  Sclerae nonicteric, noninjected.  Extraocular movements intact bilaterally. Neck:  Without mass or deformity.  Trachea is midline. Lungs:  Clear to auscultate bilaterally without  rhonchi, wheeze, or rales. Heart:  Regular rate and rhythm.  Without murmurs, rubs, or gallops. Abdomen:  Soft, nontender, nondistended.  Without guarding or rebound. Extremities: Without cyanosis, clubbing, edema, or obvious deformity. Vascular:  Dorsalis pedis and posterior tibial pulses palpable bilaterally. Skin:  Warm and dry, no erythema, no ulcerations.   Data Reviewed: I have personally reviewed following labs and imaging studies  CBC: Recent Labs  Lab 01/06/20 0905 01/07/20 1810 01/08/20 0844  WBC 7.1 7.5 5.8  NEUTROABS 4,927  --   --   HGB 10.8* 10.0* 8.1*  HCT 34.3* 31.4* 25.3*  MCV 90.7 89.7 88.2  PLT 375 375 403   Basic Metabolic Panel: Recent Labs  Lab 01/06/20 0905 01/07/20 1810 01/08/20 0844  NA 140 139 142  K 4.7 4.1 4.2  CL 106 106 110  CO2 25 23 24   GLUCOSE 101* 172* 113*  BUN 25 19 17   CREATININE 1.56* 1.52* 1.39*  CALCIUM 9.6 8.9 8.3*   GFR: Estimated Creatinine Clearance: 61.6 mL/min (A) (by C-G formula based on SCr of 1.39 mg/dL (H)). Liver Function Tests: Recent Labs  Lab 01/07/20 1810 01/08/20 0844  AST 16 11*  ALT 12 10  ALKPHOS 52 38  BILITOT 0.6 0.5  PROT 7.2 5.9*  ALBUMIN 3.8 3.3*   No results for input(s): LIPASE, AMYLASE in the last 168 hours. No results for input(s): AMMONIA in the last 168 hours. Coagulation Profile: No results for input(s): INR, PROTIME in the last 168 hours. Cardiac Enzymes: No results for input(s): CKTOTAL, CKMB, CKMBINDEX, TROPONINI in the last 168 hours. BNP (last 3 results) No results for input(s): PROBNP in the last 8760 hours. HbA1C: No results for input(s): HGBA1C in the last 72 hours. CBG: Recent Labs  Lab 01/08/20 0042 01/08/20 0654 01/08/20 1117  GLUCAP 95 98 91   Lipid Profile: No results for input(s): CHOL, HDL, LDLCALC, TRIG, CHOLHDL, LDLDIRECT in the last 72 hours. Thyroid Function Tests: No results for input(s): TSH, T4TOTAL, FREET4, T3FREE, THYROIDAB in the last 72  hours. Anemia Panel: No results for input(s): VITAMINB12, FOLATE, FERRITIN, TIBC, IRON, RETICCTPCT in the last 72 hours. Sepsis Labs: No results for input(s): PROCALCITON, LATICACIDVEN in the last 168 hours.  Recent Results (from the past 240 hour(s))  Respiratory Panel by RT PCR (Flu A&B, Covid) - Nasopharyngeal Swab     Status: None   Collection Time: 01/07/20 10:48 PM   Specimen: Nasopharyngeal Swab  Result Value Ref Range Status   SARS Coronavirus 2 by RT PCR NEGATIVE NEGATIVE Final    Comment: (NOTE) SARS-CoV-2 target nucleic acids are NOT DETECTED.  The SARS-CoV-2 RNA is generally detectable in upper respiratoy specimens during the acute phase of infection. The lowest concentration of SARS-CoV-2 viral copies this assay can detect is 131 copies/mL. A negative result does not preclude SARS-Cov-2 infection and should not be used as the sole basis for treatment or other patient management decisions. A  negative result may occur with  improper specimen collection/handling, submission of specimen other than nasopharyngeal swab, presence of viral mutation(s) within the areas targeted by this assay, and inadequate number of viral copies (<131 copies/mL). A negative result must be combined with clinical observations, patient history, and epidemiological information. The expected result is Negative.  Fact Sheet for Patients:  PinkCheek.be  Fact Sheet for Healthcare Providers:  GravelBags.it  This test is no t yet approved or cleared by the [place] FDA and  has been authorized for detection and/or diagnosis of SARS-CoV-2 by FDA under an Emergency Use Authorization (EUA). This EUA will remain  in effect (meaning this test can be used) for the duration of the COVID-19 declaration under Section 564(b)(1) of the Act, 21 U.S.C. section 360bbb-3(b)(1), unless the authorization is terminated or revoked sooner.     Influenza  A by PCR NEGATIVE NEGATIVE Final   Influenza B by PCR NEGATIVE NEGATIVE Final    Comment: (NOTE) The Xpert Xpress SARS-CoV-2/FLU/RSV assay is intended as an aid in  the diagnosis of influenza from Nasopharyngeal swab specimens and  should not be used as a sole basis for treatment. Nasal washings and  aspirates are unacceptable for Xpert Xpress SARS-CoV-2/FLU/RSV  testing.  Fact Sheet for Patients: PinkCheek.be  Fact Sheet for Healthcare Providers: GravelBags.it  This test is not yet approved or cleared by the [place] FDA and  has been authorized for detection and/or diagnosis of SARS-CoV-2 by  FDA under an Emergency Use Authorization (EUA). This EUA will remain  in effect (meaning this test can be used) for the duration of the  Covid-19 declaration under Section 564(b)(1) of the Act, 21  U.S.C. section 360bbb-3(b)(1), unless the authorization is  terminated or revoked. Performed at [facility], [place] [address], [place], [place] [address]      Radiology Studies: No results found.  Scheduled Meds: . insulin aspart  0-6 Units Subcutaneous TID WC  . [START ON 01/11/2020] pantoprazole  40 mg Intravenous Q12H   Continuous Infusions: . sodium chloride Stopped (01/08/20 0054)  . sodium chloride 125 mL/hr at 01/08/20 0624  . pantoprozole (PROTONIX) infusion 8 mg/hr (01/08/20 0816)     LOS: 0 days   Time spent: 20min  Darroll Bredeson C Charlie Char, DO [facility]  If 7PM-7AM, please contact night-coverage [email or web address]  01/08/2020, 1:16 PM

## 2020-01-09 DIAGNOSIS — K922 Gastrointestinal hemorrhage, unspecified: Secondary | ICD-10-CM | POA: Diagnosis not present

## 2020-01-09 DIAGNOSIS — K5790 Diverticulosis of intestine, part unspecified, without perforation or abscess without bleeding: Secondary | ICD-10-CM | POA: Diagnosis not present

## 2020-01-09 DIAGNOSIS — I10 Essential (primary) hypertension: Secondary | ICD-10-CM | POA: Diagnosis not present

## 2020-01-09 DIAGNOSIS — K5793 Diverticulitis of intestine, part unspecified, without perforation or abscess with bleeding: Secondary | ICD-10-CM | POA: Diagnosis not present

## 2020-01-09 DIAGNOSIS — D62 Acute posthemorrhagic anemia: Secondary | ICD-10-CM | POA: Diagnosis not present

## 2020-01-09 LAB — CBC
HCT: 24.7 % — ABNORMAL LOW (ref 39.0–52.0)
HCT: 29 % — ABNORMAL LOW (ref 39.0–52.0)
Hemoglobin: 8.1 g/dL — ABNORMAL LOW (ref 13.0–17.0)
Hemoglobin: 9.3 g/dL — ABNORMAL LOW (ref 13.0–17.0)
MCH: 28.7 pg (ref 26.0–34.0)
MCH: 28.3 pg (ref 26.0–34.0)
MCHC: 32.8 g/dL (ref 30.0–36.0)
MCHC: 32.1 g/dL (ref 30.0–36.0)
MCV: 87.6 fL (ref 80.0–100.0)
MCV: 88.1 fL (ref 80.0–100.0)
Platelets: 308 10*3/uL (ref 150–400)
Platelets: 369 10*3/uL (ref 150–400)
RBC: 2.82 MIL/uL — ABNORMAL LOW (ref 4.22–5.81)
RBC: 3.29 MIL/uL — ABNORMAL LOW (ref 4.22–5.81)
RDW: 14.6 % (ref 11.5–15.5)
RDW: 14.7 % (ref 11.5–15.5)
WBC: 6.2 10*3/uL (ref 4.0–10.5)
WBC: 6.9 10*3/uL (ref 4.0–10.5)
nRBC: 0 % (ref 0.0–0.2)
nRBC: 0 % (ref 0.0–0.2)

## 2020-01-09 LAB — GLUCOSE, CAPILLARY
Glucose-Capillary: 121 mg/dL — ABNORMAL HIGH (ref 70–99)
Glucose-Capillary: 58 mg/dL — ABNORMAL LOW (ref 70–99)
Glucose-Capillary: 158 mg/dL — ABNORMAL HIGH (ref 70–99)

## 2020-01-09 NOTE — Discharge Summary (Signed)
Physician Discharge Summary  Carl Lam:[id number] DOB: [birth year] DOA: 01/07/2020  PCP: Eulas Post, MD  Admit date: 01/07/2020 Discharge date: 01/09/2020  Admitted From: Home Disposition: Home  Recommendations for Outpatient Follow-up:  1. Follow up with PCP in 1-2 weeks 2. Repeat hemoglobin/CBC in the next 3 to 5 days per PCP schedule  Home Health: None Equipment/Devices: None  Discharge Condition: Stable CODE STATUS: Full Diet recommendation: Carb low-fat diet  Brief/Interim Summary: Carl Lam a 74 y.o.malewith medical history significant ofrecent GI bleed with polypectomy done, hypertension, hyperlipidemia, diabetes, morbid obesity, diverticular disease who presented to the ER with 4 episodes of rectal bleeding today. Patient was recently in the hospital from August 30 through September 2. During that. 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 Patient also had noted diverticular bleed. He has been doing fine at home until today when he started having these rectal bleeds. They were painless in nature. He was not dizzy and hemodynamically stable. Bleeding was stool mixed with blood. Came to the ER where he was evaluated. Patient reported eating a lot of: And has eaten a lot of food with seeds. He is not aware of the fact that this could exacerbate his diverticular disease. He denied any fever or chills no nausea vomiting and no hematemesis. GI has been consulted and recommends observation and possible CTA if bleeding continues. In EG:BTDVVOHYWV is 10.0 and his discharge hemoglobin was 10.8. Fecal occult blood testing was positive x2. Patient being admitted for further work-up.  Patient admitted as above with recurrent maroon stool concerning for acute GI bleed.  Patient previously evaluated by GI, given stable hemoglobin and resolution of maroon stool over the past 24  hours patient is otherwise stable and agreeable for discharge home, GI to follow up closely in the outpatient setting.  We discussed need for repeat labs early next week with PCP Dr. Elease Hashimoto likely on Monday or Tuesday to ensure patient's hemoglobin remained stable.  Patient otherwise tolerating p.o., feels quite well back to baseline and otherwise stable and agreeable for discharge home.  Wife at bedside agrees patient appears to be at baseline.Marland Kitchen  Discharge Diagnoses:  Principal Problem:   GI bleed Active Problems:   Type 2 diabetes mellitus with hyperglycemia (HCC)   Essential hypertension   Anemia due to blood loss, acute   Diverticulosis    Discharge Instructions  Discharge Instructions    Call MD for:   Complete by: As directed    Please return to ED if you have bright red, maroon stool, or worsening fatigue.   Call MD for:  extreme fatigue   Complete by: As directed    Increase activity slowly   Complete by: As directed      Allergies as of 01/09/2020   No Known Allergies     Medication List    STOP taking these medications   aspirin 81 MG EC tablet     TAKE these medications   atorvastatin 20 MG tablet Commonly known as: LIPITOR Take 1 tablet (20 mg total) by mouth daily.   Basaglar KwikPen 100 UNIT/ML INJECT 30 UNITS SUBCUTANEOUSLY AT BEDTIME What changed: See the new instructions.   gabapentin 100 MG capsule Commonly known as: NEURONTIN Take 2 capsules (200 mg total) by mouth 2 (two) times daily.   lisinopril 20 MG tablet Commonly known as: ZESTRIL Take 1 tablet (20 mg total) by mouth daily.   metFORMIN  500 MG tablet Commonly known as: GLUCOPHAGE Take two tablets twice daily. What changed:   how much to take  how to take this  when to take this   pantoprazole 40 MG tablet Commonly known as: PROTONIX Take 1 tablet (40 mg total) by mouth daily at 6 (six) AM.       No Known Allergies  Consultations: GI, [place] Procedures/Studies: CT  HEAD WO CONTRAST  Result Date: 12/14/2019 CLINICAL DATA:  Slurred speech since 7 a.m., abnormal gait, last known well sign a morning EXAM: CT HEAD WITHOUT CONTRAST TECHNIQUE: Contiguous axial images were obtained from the base of the skull through the vertex without intravenous contrast. COMPARISON:  None. FINDINGS: Brain: No acute infarct or hemorrhage. Lateral ventricles and midline structures are unremarkable. No acute extra-axial fluid collections. No mass effect. Vascular: Significant atherosclerosis of the internal carotid arteries. No hyperdense vessel. Skull: Normal. Negative for fracture or focal lesion. Sinuses/Orbits: Mild mucosal thickening within the anterior ethmoid air cells. Remaining paranasal sinuses are clear. Other: None. IMPRESSION: 1. No acute intracranial process. Electronically Signed   By: Randa Ngo M.D.   On: 12/14/2019 19:03   MR ANGIO HEAD WO CONTRAST  Result Date: 12/15/2019 CLINICAL DATA:  Stroke follow-up EXAM: MRA NECK WITHOUT CONTRAST MRA HEAD WITHOUT CONTRAST TECHNIQUE: Angiographic images of the neck and circle of Willis were obtained using MRA technique without intravenous contrast. COMPARISON:  None. FINDINGS: MRA NECK FINDINGS Normal 3 vessel branching pattern of the aorta. Vertebral arteries are left-dominant. Both vertebral arteries are normal to the skull base. The carotid systems are normal. MRA HEAD FINDINGS POSTERIOR CIRCULATION: --Vertebral arteries: Normal V4 segments. --Inferior cerebellar arteries: Normal. --Basilar artery: Normal. --Superior cerebellar arteries: Normal. --Posterior cerebral arteries: Normal. There are bilateral posterior communicating arteries (p-comm) that partially supply the PCAs. ANTERIOR CIRCULATION: --Intracranial internal carotid arteries: Ectatic appearance of the terminal portion of the left internal carotid artery, at the origin of the left MCA. There are heterogeneous flow related enhancement characteristics. Normal right ICA.  --Anterior cerebral arteries (ACA): Normal. Both A1 segments are present. Patent anterior communicating artery (a-comm). --Middle cerebral arteries (MCA): Normal. IMPRESSION: 1. No occlusion or hemodynamically significant stenosis by NASCET criteria. 2. 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 CTA of the head is recommended. Electronically Signed   By: Ulyses Jarred M.D.   On: 12/15/2019 03:30   MR ANGIO NECK WO CONTRAST  Result Date: 12/15/2019 CLINICAL DATA:  Stroke follow-up EXAM: MRA NECK WITHOUT CONTRAST MRA HEAD WITHOUT CONTRAST TECHNIQUE: Angiographic images of the neck and circle of Willis were obtained using MRA technique without intravenous contrast. COMPARISON:  None. FINDINGS: MRA NECK FINDINGS Normal 3 vessel branching pattern of the aorta. Vertebral arteries are left-dominant. Both vertebral arteries are normal to the skull base. The carotid systems are normal. MRA HEAD FINDINGS POSTERIOR CIRCULATION: --Vertebral arteries: Normal V4 segments. --Inferior cerebellar arteries: Normal. --Basilar artery: Normal. --Superior cerebellar arteries: Normal. --Posterior cerebral arteries: Normal. There are bilateral posterior communicating arteries (p-comm) that partially supply the PCAs. ANTERIOR CIRCULATION: --Intracranial internal carotid arteries: Ectatic appearance of the terminal portion of the left internal carotid artery, at the origin of the left MCA. There are heterogeneous flow related enhancement characteristics. Normal right ICA. --Anterior cerebral arteries (ACA): Normal. Both A1 segments are present. Patent anterior communicating artery (a-comm). --Middle cerebral arteries (MCA): Normal. IMPRESSION: 1. No occlusion or hemodynamically significant stenosis by NASCET criteria. 2. 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 CTA of the head is recommended. Electronically Signed   By: Ulyses Jarred M.D.    On: 12/15/2019 03:30   MR BRAIN WO CONTRAST  Result Date: 12/14/2019 CLINICAL DATA:  Dizziness EXAM: MRI HEAD WITHOUT CONTRAST TECHNIQUE: Multiplanar, multiecho pulse sequences of the brain and surrounding structures were obtained without intravenous contrast. COMPARISON:  None. FINDINGS: Brain: There is an acute infarct of the ventral left pons. Mild periventricular white matter hyperintensity. Mild generalized volume loss. Midline structures are normal. No chronic microhemorrhage. Vascular: Normal flow voids. Skull and upper cervical spine: Normal Sinuses/Orbits: Bilateral ocular lens replacements. Sinuses are clear. 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 No hemorrhage or mass effect. Electronically Signed   By: Ulyses Jarred M.D.   On: 12/14/2019 23:00   ECHOCARDIOGRAM COMPLETE  Result Date: 12/15/2019    ECHOCARDIOGRAM REPORT   Patient Name:   Carl Lam Date of Exam: 12/15/2019 Medical Rec #:  [id number]         Height:       73.0 in Accession #:    [id number]        Weight:       232.0 lb Date of Birth:  [birth year]         BSA:          2.292 m Patient Age:    61 years          BP:           127/83 mmHg Patient Gender: M                 HR:           83 bpm. Exam Location:  Inpatient Procedure: 2D Echo, Cardiac Doppler and Color Doppler Indications:    Stroke 434.91 / I163.9  History:        Patient has no prior history of Echocardiogram examinations.                 Risk Factors:Hypertension, Diabetes, Dyslipidemia and Former                 Smoker.  Sonographer:    Vickie Epley RDCS Referring Phys: [place]  1. Left ventricular ejection fraction, by estimation, is 60 to 65%. The left ventricle has normal function. The left ventricle has no regional wall motion abnormalities. Left ventricular diastolic parameters are consistent with Grade I diastolic dysfunction (impaired relaxation).  2. Right ventricular systolic function is normal. The right  ventricular size is normal. Tricuspid regurgitation signal is inadequate for assessing PA pressure.  3. The mitral valve is normal in structure. No evidence of mitral valve regurgitation. No evidence of mitral stenosis.  4. The aortic valve is tricuspid. Aortic valve regurgitation is not visualized. No aortic stenosis is present.  5. Aortic dilatation noted. There is mild dilatation of the aortic root measuring 38 mm.  6. The inferior vena cava is normal in size with greater than 50% respiratory variability, suggesting right atrial pressure of 3 mmHg. FINDINGS  Left Ventricle: Left ventricular ejection fraction, by estimation, is 60 to 65%. The left ventricle has normal function. The left ventricle has no regional wall motion abnormalities. The left ventricular internal cavity size was normal in size. There is  no left ventricular hypertrophy. Left ventricular diastolic parameters are consistent with Grade I diastolic dysfunction (impaired relaxation). Right Ventricle: The right ventricular size is normal. No increase in right ventricular wall thickness. Right ventricular systolic  function is normal. Tricuspid regurgitation signal is inadequate for assessing PA pressure. Left Atrium: Left atrial size was normal in size. Right Atrium: Right atrial size was normal in size. Pericardium: There is no evidence of pericardial effusion. Mitral Valve: The mitral valve is normal in structure. No evidence of mitral valve regurgitation. No evidence of mitral valve stenosis. Tricuspid Valve: The tricuspid valve is normal in structure. Tricuspid valve regurgitation is not demonstrated. Aortic Valve: The aortic valve is tricuspid. Aortic valve regurgitation is not visualized. No aortic stenosis is present. Pulmonic Valve: The pulmonic valve was normal in structure. Pulmonic valve regurgitation is not visualized. Aorta: Aortic dilatation noted. There is mild dilatation of the aortic root measuring 38 mm. Venous: The inferior vena  cava is normal in size with greater than 50% respiratory variability, suggesting right atrial pressure of 3 mmHg. IAS/Shunts: No atrial level shunt detected by color flow Doppler.  LEFT VENTRICLE PLAX 2D LVIDd:         4.70 cm      Diastology LVIDs:         3.10 cm      LV e' lateral:   6.64 cm/s LV PW:         0.80 cm      LV E/e' lateral: 7.3 LV IVS:        0.80 cm      LV e' medial:    4.57 cm/s LVOT diam:     2.60 cm      LV E/e' medial:  10.7 LV SV:         89 LV SV Index:   39 LVOT Area:     5.31 cm  LV Volumes (MOD) LV vol d, MOD A2C: 83.5 ml LV vol d, MOD A4C: 100.0 ml LV vol s, MOD A2C: 40.2 ml LV vol s, MOD A4C: 46.7 ml LV SV MOD A2C:     43.3 ml LV SV MOD A4C:     100.0 ml LV SV MOD BP:      50.1 ml RIGHT VENTRICLE RV S prime:     11.30 cm/s TAPSE (M-mode): 1.7 cm LEFT ATRIUM             Index       RIGHT ATRIUM          Index LA diam:        4.20 cm 1.83 cm/m  RA Area:     9.16 cm LA Vol (A2C):   33.7 ml 14.71 ml/m RA Volume:   19.60 ml 8.55 ml/m LA Vol (A4C):   33.6 ml 14.66 ml/m LA Biplane Vol: 34.0 ml 14.84 ml/m  AORTIC VALVE LVOT Vmax:   82.90 cm/s LVOT Vmean:  56.300 cm/s LVOT VTI:    0.168 m  AORTA Ao Root diam: 3.80 cm MITRAL VALVE MV Area (PHT): 3.42 cm    SHUNTS MV Decel Time: 222 msec    Systemic VTI:  0.17 m MV E velocity: 48.80 cm/s  Systemic Diam: 2.60 cm MV A velocity: 77.10 cm/s MV E/A ratio:  0.63 Loralie Champagne MD Electronically signed by Loralie Champagne MD Signature Date/Time: 12/15/2019/4:22:38 PM    Final      Subjective: No acute issues or events overnight, denies any further bleeding, no fatigue weakness fevers chills nausea vomiting diarrhea constipation chest pain or shortness of breath.   Discharge Exam: Vitals:   01/09/20 0435 01/09/20 0918  BP: 115/73 113/81  Pulse: 68 68  Resp: 16 18  Temp: 98.4  F (36.9 C) 98.3 F (36.8 C)  SpO2: 99% 100%   Vitals:   01/08/20 1629 01/08/20 2051 01/09/20 0435 01/09/20 0918  BP: (!) 141/92 121/88 115/73 113/81  Pulse:  70 70 68 68  Resp: 18 14 16 18   Temp: 98.3 F (36.8 C) 98.5 F (36.9 C) 98.4 F (36.9 C) 98.3 F (36.8 C)  TempSrc: Oral Oral Oral Oral  SpO2: 100% 100% 99% 100%  Weight:  110 kg    Height:        General: Pt is alert, awake, not in acute distress Cardiovascular: RRR, S1/S2 +, no rubs, no gallops Respiratory: CTA bilaterally, no wheezing, no rhonchi Abdominal: Soft, NT, ND, bowel sounds + Extremities: no edema, no cyanosis    The results of significant diagnostics from this hospitalization (including imaging, microbiology, ancillary and laboratory) are listed below for reference.     Microbiology: Recent Results (from the past 240 hour(s))  Respiratory Panel by RT PCR (Flu A&B, Covid) - Nasopharyngeal Swab     Status: None   Collection Time: 01/07/20 10:48 PM   Specimen: Nasopharyngeal Swab  Result Value Ref Range Status   SARS Coronavirus 2 by RT PCR NEGATIVE NEGATIVE Final    Comment: (NOTE) SARS-CoV-2 target nucleic acids are NOT DETECTED.  The SARS-CoV-2 RNA is generally detectable in upper respiratoy specimens during the acute phase of infection. The lowest concentration of SARS-CoV-2 viral copies this assay can detect is 131 copies/mL. A negative result does not preclude SARS-Cov-2 infection and should not be used as the sole basis for treatment or other patient management decisions. A negative result may occur with  improper specimen collection/handling, submission of specimen other than nasopharyngeal swab, presence of viral mutation(s) within the areas targeted by this assay, and inadequate number of viral copies (<131 copies/mL). A negative result must be combined with clinical observations, patient history, and epidemiological information. The expected result is Negative.  Fact Sheet for Patients:  PinkCheek.be  Fact Sheet for Healthcare Providers:  GravelBags.it  This test is no t yet approved or  cleared by the [place] FDA and  has been authorized for detection and/or diagnosis of SARS-CoV-2 by FDA under an Emergency Use Authorization (EUA). This EUA will remain  in effect (meaning this test can be used) for the duration of the COVID-19 declaration under Section 564(b)(1) of the Act, 21 U.S.C. section 360bbb-3(b)(1), unless the authorization is terminated or revoked sooner.     Influenza A by PCR NEGATIVE NEGATIVE Final   Influenza B by PCR NEGATIVE NEGATIVE Final    Comment: (NOTE) The Xpert Xpress SARS-CoV-2/FLU/RSV assay is intended as an aid in  the diagnosis of influenza from Nasopharyngeal swab specimens and  should not be used as a sole basis for treatment. Nasal washings and  aspirates are unacceptable for Xpert Xpress SARS-CoV-2/FLU/RSV  testing.  Fact Sheet for Patients: PinkCheek.be  Fact Sheet for Healthcare Providers: GravelBags.it  This test is not yet approved or cleared by the [place] FDA and  has been authorized for detection and/or diagnosis of SARS-CoV-2 by  FDA under an Emergency Use Authorization (EUA). This EUA will remain  in effect (meaning this test can be used) for the duration of the  Covid-19 declaration under Section 564(b)(1) of the Act, 21  U.S.C. section 360bbb-3(b)(1), unless the authorization is  terminated or revoked. Performed at [facility], [place] [address], [place], [place] [address]      Labs: BNP (last 3 results) No results for  input(s): BNP in the last 8760 hours. Basic Metabolic Panel: Recent Labs  Lab 01/06/20 0905 01/07/20 1810 01/08/20 0844  NA 140 139 142  K 4.7 4.1 4.2  CL 106 106 110  CO2 25 23 24   GLUCOSE 101* 172* 113*  BUN 25 19 17   CREATININE 1.56* 1.52* 1.39*  CALCIUM 9.6 8.9 8.3*   Liver Function Tests: Recent Labs  Lab 01/07/20 1810 01/08/20 0844  AST 16 11*  ALT 12 10  ALKPHOS 52 38  BILITOT 0.6 0.5  PROT 7.2 5.9*   ALBUMIN 3.8 3.3*   No results for input(s): LIPASE, AMYLASE in the last 168 hours. No results for input(s): AMMONIA in the last 168 hours. CBC: Recent Labs  Lab 01/06/20 0905 01/07/20 1810 01/08/20 0844 01/08/20 1720 01/08/20 2228 01/09/20 0505 01/09/20 1017  WBC 7.1   < > 5.8 6.2 5.9 6.2 6.9  NEUTROABS 4,927  --   --   --   --   --   --   HGB 10.8*   < > 8.1* 8.9* 7.6* 8.1* 9.3*  HCT 34.3*   < > 25.3* 28.1* 23.6* 24.7* 29.0*  MCV 90.7   < > 88.2 87.8 86.4 87.6 88.1  PLT 375   < > 327 332 297 308 369   < > = values in this interval not displayed.   Cardiac Enzymes: No results for input(s): CKTOTAL, CKMB, CKMBINDEX, TROPONINI in the last 168 hours. BNP: Invalid input(s): POCBNP CBG: Recent Labs  Lab 01/08/20 1626 01/08/20 2052 01/09/20 0638 01/09/20 0704 01/09/20 1127  GLUCAP 103* 102* 58* 121* 158*   D-Dimer No results for input(s): DDIMER in the last 72 hours. Hgb A1c No results for input(s): HGBA1C in the last 72 hours. Lipid Profile No results for input(s): CHOL, HDL, LDLCALC, TRIG, CHOLHDL, LDLDIRECT in the last 72 hours. Thyroid function studies No results for input(s): TSH, T4TOTAL, T3FREE, THYROIDAB in the last 72 hours.  Invalid input(s): FREET3 Anemia work up No results for input(s): VITAMINB12, FOLATE, FERRITIN, TIBC, IRON, RETICCTPCT in the last 72 hours. Urinalysis No results found for: COLORURINE, APPEARANCEUR, [place], [place], [place], [place], [place], [place], PROTEINUR, UROBILINOGEN, NITRITE, LEUKOCYTESUR Sepsis Labs Invalid input(s): PROCALCITONIN,  WBC,  LACTICIDVEN Microbiology Recent Results (from the past 240 hour(s))  Respiratory Panel by RT PCR (Flu A&B, Covid) - Nasopharyngeal Swab     Status: None   Collection Time: 01/07/20 10:48 PM   Specimen: Nasopharyngeal Swab  Result Value Ref Range Status   SARS Coronavirus 2 by RT PCR NEGATIVE NEGATIVE Final    Comment: (NOTE) SARS-CoV-2 target nucleic acids are NOT DETECTED.  The  SARS-CoV-2 RNA is generally detectable in upper respiratoy specimens during the acute phase of infection. The lowest concentration of SARS-CoV-2 viral copies this assay can detect is 131 copies/mL. A negative result does not preclude SARS-Cov-2 infection and should not be used as the sole basis for treatment or other patient management decisions. A negative result may occur with  improper specimen collection/handling, submission of specimen other than nasopharyngeal swab, presence of viral mutation(s) within the areas targeted by this assay, and inadequate number of viral copies (<131 copies/mL). A negative result must be combined with clinical observations, patient history, and epidemiological information. The expected result is Negative.  Fact Sheet for Patients:  PinkCheek.be  Fact Sheet for Healthcare Providers:  GravelBags.it  This test is no t yet approved or cleared by the [place] FDA and  has been authorized for detection and/or diagnosis of SARS-CoV-2  by FDA under an Emergency Use Authorization (EUA). This EUA will remain  in effect (meaning this test can be used) for the duration of the COVID-19 declaration under Section 564(b)(1) of the Act, 21 U.S.C. section 360bbb-3(b)(1), unless the authorization is terminated or revoked sooner.     Influenza A by PCR NEGATIVE NEGATIVE Final   Influenza B by PCR NEGATIVE NEGATIVE Final    Comment: (NOTE) The Xpert Xpress SARS-CoV-2/FLU/RSV assay is intended as an aid in  the diagnosis of influenza from Nasopharyngeal swab specimens and  should not be used as a sole basis for treatment. Nasal washings and  aspirates are unacceptable for Xpert Xpress SARS-CoV-2/FLU/RSV  testing.  Fact Sheet for Patients: PinkCheek.be  Fact Sheet for Healthcare Providers: GravelBags.it  This test is not yet approved or cleared  by the [place] FDA and  has been authorized for detection and/or diagnosis of SARS-CoV-2 by  FDA under an Emergency Use Authorization (EUA). This EUA will remain  in effect (meaning this test can be used) for the duration of the  Covid-19 declaration under Section 564(b)(1) of the Act, 21  U.S.C. section 360bbb-3(b)(1), unless the authorization is  terminated or revoked. Performed at [facility], [place] [address], [place], [place] [address]      Time coordinating discharge: Over 30 minutes  SIGNED:   Little Ishikawa, DO [facility] 01/09/2020, 12:34 PM Pager   If 7PM-7AM, please contact night-coverage [email or web address]

## 2020-01-09 NOTE — Progress Notes (Signed)
Hypoglycemic Event  CBG: Results for SHANKAR, SILBER (MRN [id number]) as of 01/09/2020 06:46  Ref. Range 01/09/2020 06:38  Glucose-Capillary Latest Ref Range: 70 - 99 mg/dL 58 (L)    Treatment: 4 oz juice/soda  Symptoms: None  Follow-up CBG: Time: CBG Result Results for LANDRUM, CARBONELL (MRN [id number]) as of 01/09/2020 07:27  Ref. Range 01/09/2020 07:04  Glucose-Capillary Latest Ref Range: 70 - 99 mg/dL 121 (H)   Possible Reasons for Event: Unknown  Comments/MD notified:    Viviano Simas

## 2020-01-09 NOTE — Progress Notes (Signed)
Patient had black tarry, soft stools with streaks on bright red blood.

## 2020-01-09 NOTE — Progress Notes (Signed)
DISCHARGE NOTE HOME Carl Lam to be discharged home per MD order. Discussed prescriptions and follow up appointments with the patient. Prescriptions given to patient; medication list explained in detail. Patient verbalized understanding.  Skin clean, dry and intact without evidence of skin break down, no evidence of skin tears noted. IV catheter discontinued intact. Site without signs and symptoms of complications. Dressing and pressure applied. Pt denies pain at the site currently. No complaints noted.  Patient free of lines, drains, and wounds.   An After Visit Summary (AVS) was printed and given to the patient. Patient escorted via wheelchair, and discharged home via private auto.  Ulysee Fyock S Alpha [place], RN

## 2020-01-09 NOTE — Progress Notes (Signed)
Subjective: He had some mild bleeding.  Small clots were passed.  Objective: Vital signs in last 24 hours: Temp:  [98.3 F (36.8 C)-98.4 F (36.9 C)] 98.3 F (36.8 C) (09/25 0918) Pulse Rate:  [68] 68 (09/25 0918) Resp:  [16-18] 18 (09/25 0918) BP: (113-115)/(73-81) 113/81 (09/25 0918) SpO2:  [99 %-100 %] 100 % (09/25 0918) Last BM Date: 01/09/20  Intake/Output from previous day: 09/24 0701 - 09/25 0700 In: 1220 [P.O.:1220] Out: 0  Intake/Output this shift: No intake/output data recorded.  General appearance: alert and no distress GI: soft, non-tender; bowel sounds normal; no masses,  no organomegaly  Lab Results: Recent Labs    01/08/20 2228 01/09/20 0505 01/09/20 1017  WBC 5.9 6.2 6.9  HGB 7.6* 8.1* 9.3*  HCT 23.6* 24.7* 29.0*  PLT 297 308 369   BMET Recent Labs    01/07/20 1810 01/08/20 0844  NA 139 142  K 4.1 4.2  CL 106 110  CO2 23 24  GLUCOSE 172* 113*  BUN 19 17  CREATININE 1.52* 1.39*  CALCIUM 8.9 8.3*   LFT Recent Labs    01/08/20 0844  PROT 5.9*  ALBUMIN 3.3*  AST 11*  ALT 10  ALKPHOS 38  BILITOT 0.5   PT/INR No results for input(s): LABPROT, INR in the last 72 hours. Hepatitis Panel No results for input(s): HEPBSAG, HCVAB, HEPAIGM, HEPBIGM in the last 72 hours. C-Diff No results for input(s): CDIFFTOX in the last 72 hours. Fecal Lactopherrin No results for input(s): FECLLACTOFRN in the last 72 hours.  Studies/Results: No results found.  Medications: Scheduled: Continuous:  Assessment/Plan: 1) Diverticular bleed. 2) Anemia.   He had a mild amount of bleeding.  His HGB is stable, in fact, it is improved.  The nature of diverticular bleeds was discussed with the patient.  Plan: 1) Follow HGB and transfuse if necessary.  LOS: 0 days   Shyhiem Beeney D 01/09/2020, 9:27 PM

## 2020-01-11 ENCOUNTER — Telehealth: Payer: Self-pay | Admitting: Family Medicine

## 2020-01-11 DIAGNOSIS — K922 Gastrointestinal hemorrhage, unspecified: Secondary | ICD-10-CM

## 2020-01-11 NOTE — Telephone Encounter (Signed)
Pt stated he was in the hospital and was discharged on 01/09/2020 and they told the pt to go to his PCP to have his hemoglobin check may I have a order please.

## 2020-01-11 NOTE — Telephone Encounter (Signed)
done

## 2020-01-11 NOTE — Telephone Encounter (Signed)
Per discharge orders:  Recommendations for Outpatient Follow-up:  1. Follow up with PCP in 1-2 weeks 2. Repeat hemoglobin/CBC in the next 3 to 5 days per PCP schedule  Can you order and I will send to [place] to schedule for lab appointment and then follow up with you?

## 2020-01-29 ENCOUNTER — Telehealth: Payer: Self-pay

## 2020-01-29 NOTE — Telephone Encounter (Signed)
Plan of care forms signed by doctor were faxed back to [facility] at [phone]. Copy of forms will be in fax folder asst desk

## 2020-02-16 ENCOUNTER — Other Ambulatory Visit (INDEPENDENT_AMBULATORY_CARE_PROVIDER_SITE_OTHER): Payer: Federal, State, Local not specified - PPO

## 2020-02-16 ENCOUNTER — Encounter: Payer: Self-pay | Admitting: Gastroenterology

## 2020-02-16 ENCOUNTER — Ambulatory Visit: Payer: Federal, State, Local not specified - PPO | Admitting: Gastroenterology

## 2020-02-16 VITALS — BP 150/80 | HR 86 | Ht 74.0 in | Wt 238.2 lb

## 2020-02-16 DIAGNOSIS — K219 Gastro-esophageal reflux disease without esophagitis: Secondary | ICD-10-CM | POA: Diagnosis not present

## 2020-02-16 DIAGNOSIS — K922 Gastrointestinal hemorrhage, unspecified: Secondary | ICD-10-CM

## 2020-02-16 DIAGNOSIS — I639 Cerebral infarction, unspecified: Secondary | ICD-10-CM | POA: Diagnosis not present

## 2020-02-16 DIAGNOSIS — Z86010 Personal history of colonic polyps: Secondary | ICD-10-CM

## 2020-02-16 LAB — CBC WITH DIFFERENTIAL/PLATELET
Basophils Absolute: 0.1 10*3/uL (ref 0.0–0.1)
Basophils Relative: 1.1 % (ref 0.0–3.0)
Eosinophils Absolute: 0.2 10*3/uL (ref 0.0–0.7)
Eosinophils Relative: 2.9 % (ref 0.0–5.0)
HCT: 27.2 % — ABNORMAL LOW (ref 39.0–52.0)
Hemoglobin: 8.6 g/dL — ABNORMAL LOW (ref 13.0–17.0)
Lymphocytes Relative: 23.6 % (ref 12.0–46.0)
Lymphs Abs: 1.5 10*3/uL (ref 0.7–4.0)
MCHC: 31.6 g/dL (ref 30.0–36.0)
MCV: 73.2 fl — ABNORMAL LOW (ref 78.0–100.0)
Monocytes Absolute: 0.6 10*3/uL (ref 0.1–1.0)
Monocytes Relative: 9.5 % (ref 3.0–12.0)
Neutro Abs: 4.1 10*3/uL (ref 1.4–7.7)
Neutrophils Relative %: 62.9 % (ref 43.0–77.0)
Platelets: 366 10*3/uL (ref 150.0–400.0)
RBC: 3.72 Mil/uL — ABNORMAL LOW (ref 4.22–5.81)
RDW: 21.6 % — ABNORMAL HIGH (ref 11.5–15.5)
WBC: 6.5 10*3/uL (ref 4.0–10.5)

## 2020-02-16 NOTE — Progress Notes (Signed)
HPI :  74 year old male here for follow-up visit for lower GI bleed.  I initially saw him on December 15, 2019 when he was admitted to the hospital for change in gait/speech.  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  Neurology was following him for that.  During his hospital course he developed significant change in his hemoglobin, 15 to 8s in the setting of dark stools with normal BUN.  Of note he had a similar presentation in 2013 when he had a GI bleed which led to EGD, colonoscopy, tagged RBC scan, thought to have a right-sided diverticular bleed at the time.  Neurology was planning antiplatelet therapy and he underwent upper and lower endoscopy as outlined below:  EGD 12/16/19 - small hiatal hernia, otherwise normal exam, no source of bleeding  Colonoscopy 12/16/19 - Mild diverticulosis in the right colon. There was no evidence of diverticular bleeding. - Moderate diverticulosis in the left colon. There was no evidence of diverticular bleeding. - One 8 mm polyp in the descending colon, removed with a cold snare. Resected and retrieved. - One 10 mm polyp in the rectum, removed with a cold snare. Resected and retrieved. Persistent oozing at polypectomy site. Clips (MR conditional) were placed. - Small internal hemorrhoid. - No blood noted in the colon prior to rectal polypectomy. - The examination was otherwise normal on direct and retroflexion views.  FINAL MICROSCOPIC DIAGNOSIS:   A. RECTUM AND DESCENDING COLON, POLYPECTOMY:  - Tubular adenoma(s) and tubulovillous adenoma(s)  - Negative for high-grade dysplasia or malignancy  He was thought to have a suspected diverticular bleed at that time.   He was unfortunately readmitted on September 24 with recurrent bleeding, this time dark red bloody bowel movements.  Hemoglobin had dropped from 10 to 8.1 at that time.  It was thought that he more than likely had a diverticular bleed that was recurrent versus less likely bleeding from  polypectomy.  He was observed overnight and discharged home in stable condition.  He has not had any bleeding symptoms at all since he was last seen.  He is using Metamucil and has regular bowel habits.  He has normal brown stool, no dark blood or red blood.  Denies any stomach pains.  He is eating okay.  He has some very mild heartburn at times, no dysphagia or alarm symptoms otherwise.  He has been off aspirin since he was discharged from the hospital in September.  He is due to follow-up with his primary care next week.  Have been plan for him to resume aspirin at some point time given his recent stroke.  We discussed options.   PREVIOUS ENDOSCOPIC EVALUATIONS / GI STUDIES :  June 2013 Colonoscopy for hematochezia --poor prep --blood throughout colon. Presumed right sided diverticular hemorrhage  June 2013 EGD for hematochezia --small hiatal hernia      Past Medical History:  Diagnosis Date  . Acute lower GI bleeding 10/11/11  . Colon polyp   . CVA (cerebral vascular accident) ([place])   . DEGENERATIVE JOINT DISEASE 10/27/2008   Qualifier: Diagnosis of  By: Elease Hashimoto MD, Bruce    . DIAB W/O COMP TYPE II/UNS NOT STATED UNCNTRL 10/27/2008   Qualifier: Diagnosis of  By: Valma Cava LPN, Izora Gala    . Diverticulosis of colon with hemorrhage   . HYPERLIPIDEMIA 04/27/2009   Qualifier: Diagnosis of  By: Elease Hashimoto MD, Bruce    . HYPERTENSION 10/27/2008   Qualifier: Diagnosis of  By: Valma Cava LPN, Izora Gala  Past Surgical History:  Procedure Laterality Date  . COLONOSCOPY  10/12/2011   Procedure: COLONOSCOPY;  Surgeon: Lafayette Dragon, MD;  Location: [facility] ENDOSCOPY;  Service: Endoscopy;  Laterality: N/A;  . COLONOSCOPY WITH PROPOFOL N/A 12/16/2019   Procedure: COLONOSCOPY WITH PROPOFOL;  Surgeon: Ladene Artist, MD;  Location: [facility] ENDOSCOPY;  Service: Endoscopy;  Laterality: N/A;  . ESOPHAGOGASTRODUODENOSCOPY  10/12/2011   Procedure: ESOPHAGOGASTRODUODENOSCOPY (EGD);  Surgeon: Lafayette Dragon, MD;   Location[facility] ENDOSCOPY;  Service: Endoscopy;  Laterality: N/A;  . ESOPHAGOGASTRODUODENOSCOPY (EGD) WITH PROPOFOL N/A 12/16/2019   Procedure: ESOPHAGOGASTRODUODENOSCOPY (EGD) WITH PROPOFOL;  Surgeon: Ladene Artist, MD;  Location: [facility] ENDOSCOPY;  Service: Endoscopy;  Laterality: N/A;  . HEMOSTASIS CLIP PLACEMENT  12/16/2019   Procedure: HEMOSTASIS CLIP PLACEMENT;  Surgeon: Ladene Artist, MD;  Location: [facility] ENDOSCOPY;  Service: Endoscopy;;  . HERNIA REPAIR  2007   "navel"  . JOINT REPLACEMENT    . KNEE CARTILAGE SURGERY  1960's and 1970's   left; "2 total"  . POLYPECTOMY  12/16/2019   Procedure: POLYPECTOMY;  Surgeon: Ladene Artist, MD;  Location: [facility] ENDOSCOPY;  Service: Endoscopy;;  . TOTAL HIP ARTHROPLASTY  2007   Family History  Problem Relation Age of Onset  . Alcohol abuse Other        grandparent  . Arthritis Other        arthritis  . Diabetes Other        grandparent   Social History   Tobacco Use  . Smoking status: Former Smoker    Packs/day: 0.30    Years: 15.00    Pack years: 4.50    Types: Cigarettes    Quit date: 10/11/1978    Years since quitting: 41.3  . Smokeless tobacco: Never Used  Vaping Use  . Vaping Use: Never used  Substance Use Topics  . Alcohol use: Yes    Comment: 10/11/11 "last alcohol 27-28 years ago"  . Drug use: Yes    Types: Marijuana    Comment: "recreational marijuana in [employer]; 1970's"   Current Outpatient Medications  Medication Sig Dispense Refill  . atorvastatin (LIPITOR) 20 MG tablet Take 1 tablet (20 mg total) by mouth daily. 90 tablet 3  . gabapentin (NEURONTIN) 100 MG capsule Take 2 capsules (200 mg total) by mouth 2 (two) times daily. 360 capsule 3  . Insulin Glargine (BASAGLAR KWIKPEN) 100 UNIT/ML INJECT 30 UNITS SUBCUTANEOUSLY AT BEDTIME (Patient taking differently: Inject 30 Units into the skin daily. ) 15 mL 0  . lisinopril (ZESTRIL) 20 MG tablet Take 1 tablet (20 mg total) by mouth daily. 90 tablet 3  . metFORMIN (GLUCOPHAGE)  500 MG tablet Take two tablets twice daily. (Patient taking differently: Take 1,000 mg by mouth 2 (two) times daily with a meal. Take two tablets twice daily.) 360 tablet 3  . pantoprazole (PROTONIX) 40 MG tablet Take 1 tablet (40 mg total) by mouth daily at 6 (six) AM. 30 tablet 3   No current facility-administered medications for this visit.   No Known Allergies   Review of Systems: All systems reviewed and negative except where noted in HPI.     Physical Exam: BP (!) 150/80 (BP Location: Left Arm, Patient Position: Sitting)   Pulse 86   Ht 6\' 2"  (1.88 m)   Wt 238 lb 3.2 oz (108 kg)   SpO2 98%   BMI 30.58 kg/m  Constitutional: Pleasant,well-developed, male in no acute distress. HEENT: Normocephalic and atraumatic.  No scleral icterus. Neck supple.  Cardiovascular: Normal rate, regular rhythm.  Pulmonary/chest: Effort normal and breath sounds normal. Abdominal: Soft, nondistended, nontender. Periumbilical hernia Extremities: no edema Lymphadenopathy: No cervical adenopathy noted. Neurological: Alert and oriented to person place and time. Skin: Skin is warm and dry. No rashes noted. Psychiatric: Normal mood and affect. Behavior is normal.   ASSESSMENT AND PLAN: 74 year old male here for reassessment the following:  History of lower GI bleed History of CVA History of colon polyps GERD  As above, previously admitted for a CVA and developed a GI bleed at the time thought to be due to diverticulosis.  EGD and colonoscopy as above.  He had a few polyps removed that were adenomatous.  He had then rebleeding that led to readmission 3 weeks later.  This was thought to be more than likely a diverticular bleed and resolved on its own.  He has been doing well since that time denies any overt GI bleeding.  He has been off aspirin.  I think given his recent CVA he would benefit from aspirin and benefits likely outweigh risks.  I do not think that would be high risk for causing recurrent  diverticular bleeding.  That being said I think it is reasonable to check his hemoglobin today to make sure stable as we do that.  He will continue to monitor for any recurrence of bleeding symptoms moving forward.  In regards to his reflux symptoms he can use Pepcid as needed moving forward.  He is due for repeat colonoscopy in 3 years for surveillance of colon polyps.  I will let him know results of blood work with recommendations.  He agreed.    Adamstown Cellar, MD [facility]  CC: Eulas Post, MD

## 2020-02-16 NOTE — Patient Instructions (Signed)
Your provider has requested that you go to the basement level for lab work before leaving today. Press "B" on the elevator. The lab is located at the first door on the left as you exit the elevator.  Please purchase the following medications over the counter and take as directed: Pepcid 20 mg as needed for reflux OR Tums as needed  You will be due for a recall colonoscopy in 09/2022. We will send you a reminder in the mail when it gets closer to that time.  Please follow up with Dr Havery Moros in the office as needed.  Please resume your aspirin 81 mg daily.  If you are age 32 or older, your body mass index should be between 23-30. Your Body mass index is 31.14 kg/m. If this is out of the aforementioned range listed, please consider follow up with your Primary Care Provider.  Due to recent changes in healthcare laws, you may see the results of your imaging and laboratory studies on MyChart before your provider has had a chance to review them.  We understand that in some cases there may be results that are confusing or concerning to you. Not all laboratory results come back in the same time frame and the provider may be waiting for multiple results in order to interpret others.  Please give [place] 48 hours in order for your provider to thoroughly review all the results before contacting the office for clarification of your results.

## 2020-02-17 ENCOUNTER — Other Ambulatory Visit: Payer: Self-pay

## 2020-02-17 ENCOUNTER — Telehealth: Payer: Self-pay | Admitting: Gastroenterology

## 2020-02-17 ENCOUNTER — Other Ambulatory Visit (INDEPENDENT_AMBULATORY_CARE_PROVIDER_SITE_OTHER): Payer: Federal, State, Local not specified - PPO

## 2020-02-17 DIAGNOSIS — I639 Cerebral infarction, unspecified: Secondary | ICD-10-CM

## 2020-02-17 DIAGNOSIS — K922 Gastrointestinal hemorrhage, unspecified: Secondary | ICD-10-CM

## 2020-02-17 DIAGNOSIS — Z860100 Personal history of colon polyps, unspecified: Secondary | ICD-10-CM

## 2020-02-17 DIAGNOSIS — D62 Acute posthemorrhagic anemia: Secondary | ICD-10-CM

## 2020-02-17 DIAGNOSIS — D509 Iron deficiency anemia, unspecified: Secondary | ICD-10-CM

## 2020-02-17 LAB — IBC + FERRITIN
Ferritin: 6.6 ng/mL — ABNORMAL LOW (ref 22.0–322.0)
Iron: 20 ug/dL — ABNORMAL LOW (ref 42–165)
Saturation Ratios: 4.7 % — ABNORMAL LOW (ref 20.0–50.0)
Transferrin: 301 mg/dL (ref 212.0–360.0)

## 2020-02-17 NOTE — Telephone Encounter (Signed)
Spoke with patient, see 02/16/20 lab result note for more information

## 2020-02-20 ENCOUNTER — Other Ambulatory Visit: Payer: Self-pay | Admitting: Family Medicine

## 2020-02-22 ENCOUNTER — Ambulatory Visit: Payer: Federal, State, Local not specified - PPO | Admitting: Family Medicine

## 2020-02-22 ENCOUNTER — Encounter: Payer: Self-pay | Admitting: Family Medicine

## 2020-02-22 ENCOUNTER — Other Ambulatory Visit: Payer: Self-pay

## 2020-02-22 VITALS — BP 134/80 | HR 78 | Temp 98.2°F | Ht 74.0 in | Wt 233.7 lb

## 2020-02-22 DIAGNOSIS — Z794 Long term (current) use of insulin: Secondary | ICD-10-CM

## 2020-02-22 DIAGNOSIS — I10 Essential (primary) hypertension: Secondary | ICD-10-CM

## 2020-02-22 DIAGNOSIS — E1165 Type 2 diabetes mellitus with hyperglycemia: Secondary | ICD-10-CM | POA: Diagnosis not present

## 2020-02-22 DIAGNOSIS — K922 Gastrointestinal hemorrhage, unspecified: Secondary | ICD-10-CM | POA: Diagnosis not present

## 2020-02-22 NOTE — Progress Notes (Signed)
Established Patient Office Visit  Subjective:  Patient ID: Carl Lam, male    DOB: December 04, 1945  Age: 74 y.o. MRN: [id number]  CC:  Chief Complaint  Patient presents with  . Follow-up    3 month follow up , ringing in ears     HPI BECKETT MADEN presents for recent hospital follow-up.  He had stroke few months ago and during that hospitalization was noted be anemic.  He has history of diverticulosis bleed and felt his bleed at that time was diverticulosis related.  Subsequently had EGD and colonoscopy.  His colonoscopy revealed adenomatous polyps.  He then was readmitted on 24 September with presumed diverticulosis bleed.  Had red blood cell scan.  He has pending outpatient capsule endoscopy.  Last hemoglobin per GI 8.6.  Is on iron replacement.  No dizziness but does have some increased fatigue issues.  Has not seen any overt signs of blood in his stool recently.  No epigastric pains.  Had been taken off aspirin but did have recent stroke as above.  GI felt he could probably go back on aspirin after balancing risk and benefit.  He has type 2 diabetes, hypertension, dyslipidemia.  Blood sugar stable.  Fastings around 115.  Last A1c 2 months ago 6.0%.  Past Medical History:  Diagnosis Date  . Acute lower GI bleeding 10/11/11  . Colon polyp   . CVA (cerebral vascular accident) ([place])   . DEGENERATIVE JOINT DISEASE 10/27/2008   Qualifier: Diagnosis of  By: Elease Hashimoto MD, Tashara Suder    . DIAB W/O COMP TYPE II/UNS NOT STATED UNCNTRL 10/27/2008   Qualifier: Diagnosis of  By: Valma Cava LPN, Izora Gala    . Diverticulosis of colon with hemorrhage   . HYPERLIPIDEMIA 04/27/2009   Qualifier: Diagnosis of  By: Elease Hashimoto MD, Daylin Eads    . HYPERTENSION 10/27/2008   Qualifier: Diagnosis of  By: Valma Cava LPN, Izora Gala      Past Surgical History:  Procedure Laterality Date  . COLONOSCOPY  10/12/2011   Procedure: COLONOSCOPY;  Surgeon: Lafayette Dragon, MD;  Location: [facility] ENDOSCOPY;  Service: Endoscopy;  Laterality:  N/A;  . COLONOSCOPY WITH PROPOFOL N/A 12/16/2019   Procedure: COLONOSCOPY WITH PROPOFOL;  Surgeon: Ladene Artist, MD;  Location: [facility] ENDOSCOPY;  Service: Endoscopy;  Laterality: N/A;  . ESOPHAGOGASTRODUODENOSCOPY  10/12/2011   Procedure: ESOPHAGOGASTRODUODENOSCOPY (EGD);  Surgeon: Lafayette Dragon, MD;  Location[facility] ENDOSCOPY;  Service: Endoscopy;  Laterality: N/A;  . ESOPHAGOGASTRODUODENOSCOPY (EGD) WITH PROPOFOL N/A 12/16/2019   Procedure: ESOPHAGOGASTRODUODENOSCOPY (EGD) WITH PROPOFOL;  Surgeon: Ladene Artist, MD;  Location: [facility] ENDOSCOPY;  Service: Endoscopy;  Laterality: N/A;  . HEMOSTASIS CLIP PLACEMENT  12/16/2019   Procedure: HEMOSTASIS CLIP PLACEMENT;  Surgeon: Ladene Artist, MD;  Location: [facility] ENDOSCOPY;  Service: Endoscopy;;  . HERNIA REPAIR  2007   "navel"  . JOINT REPLACEMENT    . KNEE CARTILAGE SURGERY  1960's and 1970's   left; "2 total"  . POLYPECTOMY  12/16/2019   Procedure: POLYPECTOMY;  Surgeon: Ladene Artist, MD;  Location: [facility] ENDOSCOPY;  Service: Endoscopy;;  . TOTAL HIP ARTHROPLASTY  2007    Family History  Problem Relation Age of Onset  . Alcohol abuse Other        grandparent  . Arthritis Other        arthritis  . Diabetes Other        grandparent    Social History   Socioeconomic History  . Marital status: Married    Spouse name:  Not on file  . Number of children: Not on file  . Years of education: Not on file  . Highest education level: Not on file  Occupational History  . Not on file  Tobacco Use  . Smoking status: Former Smoker    Packs/day: 0.30    Years: 15.00    Pack years: 4.50    Types: Cigarettes    Quit date: 10/11/1978    Years since quitting: 41.3  . Smokeless tobacco: Never Used  Vaping Use  . Vaping Use: Never used  Substance and Sexual Activity  . Alcohol use: Not Currently    Comment: 10/11/11 "last alcohol 27-28 years ago"  . Drug use: Yes    Types: Marijuana    Comment: "recreational marijuana in [employer]; 1970's"  . Sexual  activity: Not Currently  Other Topics Concern  . Not on file  Social History Narrative  . Not on file   Social Determinants of Health   Financial Resource Strain:   . Difficulty of Paying Living Expenses: Not on file  Food Insecurity:   . Worried About Charity fundraiser in the Last Year: Not on file  . Ran Out of Food in the Last Year: Not on file  Transportation Needs:   . Lack of Transportation (Medical): Not on file  . Lack of Transportation (Non-Medical): Not on file  Physical Activity:   . Days of Exercise per Week: Not on file  . Minutes of Exercise per Session: Not on file  Stress:   . Feeling of Stress : Not on file  Social Connections:   . Frequency of Communication with Friends and Family: Not on file  . Frequency of Social Gatherings with Friends and Family: Not on file  . Attends Religious Services: Not on file  . Active Member of Clubs or Organizations: Not on file  . Attends Archivist Meetings: Not on file  . Marital Status: Not on file  Intimate Partner Violence:   . Fear of Current or Ex-Partner: Not on file  . Emotionally Abused: Not on file  . Physically Abused: Not on file  . Sexually Abused: Not on file    Outpatient Medications Prior to Visit  Medication Sig Dispense Refill  . atorvastatin (LIPITOR) 20 MG tablet Take 1 tablet (20 mg total) by mouth daily. 90 tablet 3  . gabapentin (NEURONTIN) 100 MG capsule Take 2 capsules (200 mg total) by mouth 2 (two) times daily. 360 capsule 3  . Insulin Glargine (BASAGLAR KWIKPEN) 100 UNIT/ML INJECT 30 UNITS SUBCUTANEOUSLY AT BEDTIME (Patient taking differently: Inject 30 Units into the skin daily. ) 15 mL 0  . lisinopril (ZESTRIL) 20 MG tablet Take 1 tablet (20 mg total) by mouth daily. 90 tablet 3  . metFORMIN (GLUCOPHAGE) 500 MG tablet Take two tablets twice daily. (Patient taking differently: Take 1,000 mg by mouth 2 (two) times daily with a meal. Take two tablets twice daily.) 360 tablet 3  .  pantoprazole (PROTONIX) 40 MG tablet Take 1 tablet (40 mg total) by mouth daily at 6 (six) AM. (Patient not taking: Reported on 02/22/2020) 30 tablet 3   No facility-administered medications prior to visit.    No Known Allergies  ROS Review of Systems  Constitutional: Positive for fatigue.  Eyes: Negative for visual disturbance.  Respiratory: Negative for cough, chest tightness and shortness of breath.   Cardiovascular: Negative for chest pain, palpitations and leg swelling.  Gastrointestinal: Negative for abdominal pain.  Neurological: Negative for dizziness,  syncope, weakness, light-headedness and headaches.  Hematological: Negative for adenopathy.      Objective:    Physical Exam Vitals reviewed.  Constitutional:      Appearance: Normal appearance.  Cardiovascular:     Rate and Rhythm: Normal rate and regular rhythm.  Pulmonary:     Effort: Pulmonary effort is normal.     Breath sounds: Normal breath sounds.  Musculoskeletal:     Right lower leg: No edema.     Left lower leg: No edema.  Neurological:     Mental Status: He is alert.     BP 134/80 (BP Location: Left Arm, Patient Position: Sitting, Cuff Size: Normal)   Pulse 78   Temp 98.2 F (36.8 C) (Oral)   Ht 6\' 2"  (1.88 m)   Wt 233 lb 11.2 oz (106 kg)   SpO2 99%   BMI 30.01 kg/m  Wt Readings from Last 3 Encounters:  02/22/20 233 lb 11.2 oz (106 kg)  02/16/20 238 lb 3.2 oz (108 kg)  01/08/20 242 lb 8.1 oz (110 kg)     Health Maintenance Due  Topic Date Due  . TETANUS/TDAP  Never done  . PNA vac Low Risk Adult (1 of 2 - PCV13) Never done    There are no preventive care reminders to display for this patient.  Lab Results  Component Value Date   TSH 2.66 07/15/2017   Lab Results  Component Value Date   WBC 6.5 02/16/2020   HGB 8.6 Repeated and verified X2. (L) 02/16/2020   HCT 27.2 (L) 02/16/2020   MCV 73.2 (L) 02/16/2020   PLT 366.0 02/16/2020   Lab Results  Component Value Date   NA 142  01/08/2020   K 4.2 01/08/2020   CO2 24 01/08/2020   GLUCOSE 113 (H) 01/08/2020   BUN 17 01/08/2020   CREATININE 1.39 (H) 01/08/2020   BILITOT 0.5 01/08/2020   ALKPHOS 38 01/08/2020   AST 11 (L) 01/08/2020   ALT 10 01/08/2020   PROT 5.9 (L) 01/08/2020   ALBUMIN 3.3 (L) 01/08/2020   CALCIUM 8.3 (L) 01/08/2020   ANIONGAP 8 01/08/2020   GFR 65.02 10/15/2018   Lab Results  Component Value Date   CHOL 107 12/15/2019   Lab Results  Component Value Date   HDL 36 (L) 12/15/2019   Lab Results  Component Value Date   LDLCALC 59 12/15/2019   Lab Results  Component Value Date   TRIG 61 12/15/2019   Lab Results  Component Value Date   CHOLHDL 3.0 12/15/2019   Lab Results  Component Value Date   HGBA1C 6.0 (H) 12/15/2019      Assessment & Plan:   #1 iron deficiency related to lower GI bleed most likely diverticular.  GI work-up ongoing.  Patient on iron replacement.  He has future lab for repeat CBC per GI  #2 type 2 diabetes well controlled last A1c 6.0%.  Continue current regimen.  Recheck A1c at 62-month follow-up  #3 hypertension stable by today's reading  #4 dyslipidemia.  Goal LDL less than 70.  Continue Lipitor  No orders of the defined types were placed in this encounter.   Follow-up: Return in about 2 months (around 04/23/2020).    Carolann Littler, MD

## 2020-02-22 NOTE — Patient Instructions (Signed)
Resume the baby aspirin one daily  Let's plan on 2 month follow up   Be sure to get follow up labs with gastroenterology.

## 2020-02-23 NOTE — Telephone Encounter (Signed)
Last OV 02/22/20 Last fill for BAS 12/26/19  #71ml/0 Last fill for GAB 01/16/19  #360/3

## 2020-02-25 ENCOUNTER — Telehealth: Payer: Self-pay

## 2020-02-25 NOTE — Telephone Encounter (Signed)
-----   Message from Yevette Edwards, RN sent at 02/17/2020  3:59 PM EDT ----- Regarding: Hold Iron Advise patient to hold iron supplement for capsule endoscopy on 03/03/20

## 2020-02-25 NOTE — Telephone Encounter (Signed)
Lm on vm for patient to return call 

## 2020-02-25 NOTE — Telephone Encounter (Signed)
Patient is returning your call and I also advised him to hold iron supplement. Please still call patient back

## 2020-02-25 NOTE — Telephone Encounter (Signed)
Spoke with patient, he is aware that he will come in for repeat labs tomorrow and will pick up prep instructions for capsule endoscopy on 03/03/20. Advised patient that he will need to hold his iron supplement until after his capsule endoscopy. Advised patient that as far as diabetic medication he can take those beginning at 10:30 AM after he ingests the capsule. Patient verbalized understanding of all information and had no other concerns at the end of the call

## 2020-02-26 ENCOUNTER — Other Ambulatory Visit (INDEPENDENT_AMBULATORY_CARE_PROVIDER_SITE_OTHER): Payer: Federal, State, Local not specified - PPO

## 2020-02-26 DIAGNOSIS — D509 Iron deficiency anemia, unspecified: Secondary | ICD-10-CM | POA: Diagnosis not present

## 2020-02-26 LAB — CBC WITH DIFFERENTIAL/PLATELET
Basophils Absolute: 0.1 10*3/uL (ref 0.0–0.1)
Basophils Relative: 1 % (ref 0.0–3.0)
Eosinophils Absolute: 0.2 10*3/uL (ref 0.0–0.7)
Eosinophils Relative: 2.7 % (ref 0.0–5.0)
HCT: 29 % — ABNORMAL LOW (ref 39.0–52.0)
Hemoglobin: 9.1 g/dL — ABNORMAL LOW (ref 13.0–17.0)
Lymphocytes Relative: 21.4 % (ref 12.0–46.0)
Lymphs Abs: 1.4 10*3/uL (ref 0.7–4.0)
MCHC: 31.3 g/dL (ref 30.0–36.0)
MCV: 73.2 fl — ABNORMAL LOW (ref 78.0–100.0)
Monocytes Absolute: 0.6 10*3/uL (ref 0.1–1.0)
Monocytes Relative: 8.7 % (ref 3.0–12.0)
Neutro Abs: 4.3 10*3/uL (ref 1.4–7.7)
Neutrophils Relative %: 66.2 % (ref 43.0–77.0)
Platelets: 336 10*3/uL (ref 150.0–400.0)
RBC: 3.96 Mil/uL — ABNORMAL LOW (ref 4.22–5.81)
RDW: 23.3 % — ABNORMAL HIGH (ref 11.5–15.5)
WBC: 6.5 10*3/uL (ref 4.0–10.5)

## 2020-02-29 ENCOUNTER — Other Ambulatory Visit: Payer: Self-pay

## 2020-02-29 ENCOUNTER — Telehealth: Payer: Self-pay

## 2020-02-29 DIAGNOSIS — D509 Iron deficiency anemia, unspecified: Secondary | ICD-10-CM

## 2020-02-29 NOTE — Telephone Encounter (Signed)
Spoke with patient, he had labs drawn on 02/26/20 and has already picked up instructions. Discussed labs results, see 02/26/20 result note for more information. Patient had no other concerns.

## 2020-02-29 NOTE — Telephone Encounter (Signed)
-----   Message from Yevette Edwards, RN sent at 02/17/2020  3:44 PM EDT ----- Regarding: Labs Repeat CBC, order in epic  Remind patient to pick up capsule endoscopy instructions from front desk.

## 2020-03-03 ENCOUNTER — Encounter: Payer: Self-pay | Admitting: Gastroenterology

## 2020-03-03 ENCOUNTER — Ambulatory Visit (INDEPENDENT_AMBULATORY_CARE_PROVIDER_SITE_OTHER): Payer: Federal, State, Local not specified - PPO | Admitting: Gastroenterology

## 2020-03-03 DIAGNOSIS — D509 Iron deficiency anemia, unspecified: Secondary | ICD-10-CM | POA: Diagnosis not present

## 2020-03-03 NOTE — Patient Instructions (Addendum)
Do not eat or drink for 4 hours after ingesting the capsule  You may have a clear liquid diet at 10:30 am  You may have a light lunch beginning at 12:30 ( ex 1/2 sandwich and a bowl of soup)  You may resume your normal diet at 5:00 pm  If you experience any abdominal pain, nausea ,or vomiting after ingesting the capsule please call [phone] to notify the nurse  You may not have an MRI until you have confirmation that you have passed the capsule.

## 2020-03-03 NOTE — Progress Notes (Signed)
Capsule ID: [id number] Exp: 03/15/2021 LOT: [id number]  Patient arrived for Capsule Endoscopy. Reported the prep went well. I explained dietary restrictions for the next few hours. Patient verbalized understanding. Opened capsule, ensured capsule was flashing prior to the patient swallowing the capsule. Patient swallowed capsule without difficulty. Patient instructed to return to the office at 4:00 pm today for removal of the recording equipment, to call the office with any questions and if no capsule was visualized after 72 hours. No further questions by the conclusion of the visit.

## 2020-03-14 ENCOUNTER — Telehealth: Payer: Self-pay | Admitting: Gastroenterology

## 2020-03-14 DIAGNOSIS — T184XXA Foreign body in colon, initial encounter: Secondary | ICD-10-CM

## 2020-03-14 NOTE — Telephone Encounter (Signed)
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 If not, can you please order 2 view abdominal xray to rule out retained capsule. He is also due for a follow up CBC if you can have him go to the lab, he should be taking his oral iron. We may need to consider CT scan of his abdomen / pelvis to clear his bowel but would like xray and repeat CBC done first. Thanks

## 2020-03-17 NOTE — Telephone Encounter (Signed)
Lm on vm for patient to return call 

## 2020-03-17 NOTE — Addendum Note (Signed)
Addended by: Yevette Edwards on: 03/17/2020 11:24 AM   Modules accepted: Orders

## 2020-03-17 NOTE — Telephone Encounter (Signed)
Pt returning your call

## 2020-03-17 NOTE — Telephone Encounter (Signed)
Spoke with patient in regards to capsule endoscopy results and next steps. Patient states that 24 hours after he ingested the capsule he had liquid stools and felt like he passed a lump which he thought could have been the capsule but was not sure if it was the capsule. Advised patient to continue the oral iron. He is aware that he is due for repeat labs and will need an xray just to make sure that he has not retained the capsule. Advised that we may consider a CT scan but will await results from xray and lab work for further recommendations. Patient is aware that no appt is necessary for the lab work or x-ray and he can stop by at his convenience.  Lab order in epic. X-ray order in epic.

## 2020-03-18 ENCOUNTER — Ambulatory Visit (INDEPENDENT_AMBULATORY_CARE_PROVIDER_SITE_OTHER)
Admission: RE | Admit: 2020-03-18 | Discharge: 2020-03-18 | Disposition: A | Discharge status: Home / Self Care | Payer: Federal, State, Local not specified - PPO | Source: Ambulatory Visit | Attending: Physician Assistant | Admitting: Physician Assistant

## 2020-03-18 ENCOUNTER — Other Ambulatory Visit (INDEPENDENT_AMBULATORY_CARE_PROVIDER_SITE_OTHER): Payer: Federal, State, Local not specified - PPO

## 2020-03-18 ENCOUNTER — Other Ambulatory Visit: Payer: Self-pay

## 2020-03-18 DIAGNOSIS — T184XXA Foreign body in colon, initial encounter: Secondary | ICD-10-CM

## 2020-03-18 DIAGNOSIS — D509 Iron deficiency anemia, unspecified: Secondary | ICD-10-CM

## 2020-03-18 LAB — CBC WITH DIFFERENTIAL/PLATELET
Basophils Absolute: 0.1 10*3/uL (ref 0.0–0.1)
Basophils Relative: 1.2 % (ref 0.0–3.0)
Eosinophils Absolute: 0.2 10*3/uL (ref 0.0–0.7)
Eosinophils Relative: 2.5 % (ref 0.0–5.0)
HCT: 32.6 % — ABNORMAL LOW (ref 39.0–52.0)
Hemoglobin: 10.2 g/dL — ABNORMAL LOW (ref 13.0–17.0)
Lymphocytes Relative: 26.8 % (ref 12.0–46.0)
Lymphs Abs: 1.7 10*3/uL (ref 0.7–4.0)
MCHC: 31.2 g/dL (ref 30.0–36.0)
MCV: 72.4 fl — ABNORMAL LOW (ref 78.0–100.0)
Monocytes Absolute: 0.5 10*3/uL (ref 0.1–1.0)
Monocytes Relative: 8 % (ref 3.0–12.0)
Neutro Abs: 4 10*3/uL (ref 1.4–7.7)
Neutrophils Relative %: 61.5 % (ref 43.0–77.0)
Platelets: 290 10*3/uL (ref 150.0–400.0)
RBC: 4.51 Mil/uL (ref 4.22–5.81)
RDW: 24.7 % — ABNORMAL HIGH (ref 11.5–15.5)
WBC: 6.5 10*3/uL (ref 4.0–10.5)

## 2020-03-21 ENCOUNTER — Other Ambulatory Visit: Payer: Self-pay

## 2020-03-21 DIAGNOSIS — K922 Gastrointestinal hemorrhage, unspecified: Secondary | ICD-10-CM

## 2020-03-21 DIAGNOSIS — D509 Iron deficiency anemia, unspecified: Secondary | ICD-10-CM

## 2020-03-31 ENCOUNTER — Other Ambulatory Visit: Payer: Self-pay | Admitting: Family Medicine

## 2020-04-04 ENCOUNTER — Other Ambulatory Visit: Payer: Self-pay

## 2020-04-04 ENCOUNTER — Ambulatory Visit (HOSPITAL_COMMUNITY)
Admission: RE | Admit: 2020-04-04 | Discharge: 2020-04-04 | Disposition: A | Discharge status: Home / Self Care | Payer: Federal, State, Local not specified - PPO | Source: Ambulatory Visit | Attending: Gastroenterology | Admitting: Gastroenterology

## 2020-04-04 DIAGNOSIS — D509 Iron deficiency anemia, unspecified: Secondary | ICD-10-CM | POA: Insufficient documentation

## 2020-04-04 DIAGNOSIS — K922 Gastrointestinal hemorrhage, unspecified: Secondary | ICD-10-CM | POA: Diagnosis present

## 2020-04-04 LAB — POCT I-STAT CREATININE: Creatinine, Ser: 1.6 mg/dL — ABNORMAL HIGH (ref 0.61–1.24)

## 2020-04-04 MED ORDER — BARIUM SULFATE 0.1 % PO SUSP
ORAL | Status: AC
  Filled 2020-04-04: qty 3

## 2020-04-04 MED ORDER — IOHEXOL 300 MG/ML  SOLN
100.0000 mL | Freq: Once | INTRAMUSCULAR | Status: AC | PRN
  Administered 2020-04-04: 80 mL via INTRAVENOUS

## 2020-04-05 ENCOUNTER — Telehealth: Payer: Self-pay

## 2020-04-05 ENCOUNTER — Other Ambulatory Visit: Payer: Self-pay

## 2020-04-05 DIAGNOSIS — N289 Disorder of kidney and ureter, unspecified: Secondary | ICD-10-CM

## 2020-04-05 NOTE — Telephone Encounter (Signed)
Thanks [employer]. I called the patient, left message, no answer. I will call back again tomorrow.

## 2020-04-05 NOTE — Telephone Encounter (Signed)
Received a call report from [facility] in radiology:  IMPRESSION: 1. No small bowel pathology to explain anemia. 2. Hypoattenuating pancreatic tail lesion. This could represent a septated cystic indolent neoplasm. However, adenocarcinoma cannot be excluded. Potential clinical strategies include more definitive characterization with pre and post contrast abdominal MRI/MRCP versus endoscopic ultrasound sampling (if feasible). 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 4. Left greater than right hydroureteronephrosis with suboptimal evaluation of the pelvis secondary to right hip arthroplasty. Favored to be related to bladder distension in the setting of prostatic enlargement. 5. Coronary artery atherosclerosis. Aortic Atherosclerosis (ICD10-I70.0).

## 2020-04-07 NOTE — Telephone Encounter (Signed)
Pt is returning a missed call from a nurse in regards to his results

## 2020-04-12 ENCOUNTER — Other Ambulatory Visit: Payer: Self-pay

## 2020-04-12 DIAGNOSIS — R948 Abnormal results of function studies of other organs and systems: Secondary | ICD-10-CM

## 2020-04-13 ENCOUNTER — Other Ambulatory Visit: Payer: Self-pay | Admitting: Family Medicine

## 2020-04-13 ENCOUNTER — Telehealth: Payer: Self-pay

## 2020-04-13 NOTE — Telephone Encounter (Signed)
Spoke with patient to remind him that he is due for repeat labs this week. Advised that no appointment is necessary. He is aware that he can stop by the lab in the basement of our office building between 7:30 AM - 5 PM. Patient verbalized understanding and states that he will go by the lab tomorrow, pt had no concerns at the end of the call.

## 2020-04-13 NOTE — Telephone Encounter (Signed)
-----   Message from Missy Sabins, RN sent at 04/05/2020  8:21 AM EST ----- Regarding: Labs Repeat BMET, order in epic

## 2020-04-13 NOTE — Telephone Encounter (Signed)
See 04/04/20 CT result note for more information.

## 2020-04-14 ENCOUNTER — Other Ambulatory Visit (INDEPENDENT_AMBULATORY_CARE_PROVIDER_SITE_OTHER): Payer: Federal, State, Local not specified - PPO

## 2020-04-14 DIAGNOSIS — N289 Disorder of kidney and ureter, unspecified: Secondary | ICD-10-CM

## 2020-04-14 LAB — BASIC METABOLIC PANEL
BUN: 18 mg/dL (ref 6–23)
CO2: 28 mEq/L (ref 19–32)
Calcium: 9.4 mg/dL (ref 8.4–10.5)
Chloride: 104 mEq/L (ref 96–112)
Creatinine, Ser: 1.42 mg/dL (ref 0.40–1.50)
GFR: 48.91 mL/min — ABNORMAL LOW (ref >60.00)
Glucose, Bld: 186 mg/dL — ABNORMAL HIGH (ref 70–99)
Potassium: 4.4 mEq/L (ref 3.5–5.1)
Sodium: 138 mEq/L (ref 135–145)

## 2020-04-22 ENCOUNTER — Other Ambulatory Visit: Payer: Self-pay

## 2020-04-22 ENCOUNTER — Other Ambulatory Visit: Payer: Self-pay | Admitting: Family Medicine

## 2020-04-25 ENCOUNTER — Ambulatory Visit: Payer: Federal, State, Local not specified - PPO | Admitting: Family Medicine

## 2020-04-25 ENCOUNTER — Other Ambulatory Visit: Payer: Self-pay

## 2020-04-25 ENCOUNTER — Encounter: Payer: Self-pay | Admitting: Family Medicine

## 2020-04-25 VITALS — BP 182/100 | HR 79 | Ht 74.0 in | Wt 239.0 lb

## 2020-04-25 DIAGNOSIS — E785 Hyperlipidemia, unspecified: Secondary | ICD-10-CM

## 2020-04-25 DIAGNOSIS — I10 Essential (primary) hypertension: Secondary | ICD-10-CM | POA: Diagnosis not present

## 2020-04-25 DIAGNOSIS — E1165 Type 2 diabetes mellitus with hyperglycemia: Secondary | ICD-10-CM

## 2020-04-25 DIAGNOSIS — Z794 Long term (current) use of insulin: Secondary | ICD-10-CM

## 2020-04-25 LAB — POCT GLYCOSYLATED HEMOGLOBIN (HGB A1C): Hemoglobin A1C: 6 % — AB (ref 4.0–5.6)

## 2020-04-25 MED ORDER — AMLODIPINE BESYLATE 5 MG PO TABS: 5.0000 mg | ORAL_TABLET | Freq: Every day | ORAL | 3 refills | Status: DC

## 2020-04-25 NOTE — Patient Instructions (Signed)

## 2020-04-25 NOTE — Progress Notes (Signed)
Established Patient Office Visit  Subjective:  Patient ID: Carl Lam, male    DOB: 1945/10/05  Age: 75 y.o. MRN: [id number]  CC:  Chief Complaint  Patient presents with  . Diabetes    HPI Carl Lam presents for medical follow-up.  He has type 2 diabetes, hypertension, history of CVA, history of anemia secondary to diverticulosis bleeds, hyperlipidemia.  He has been followed by GI regarding his anemia.  Last hemoglobin 10.2.  He feels his energy levels are improving somewhat.  His exercise has been curtailed since his stroke.  He hopes to start back some walking soon.  Blood pressure is quite elevated today.  This has been sporadically elevated in the past.  He takes lisinopril 20 mg daily.  No headaches or dizziness.  No chest pains.  Fasting blood sugar this morning 115.  Last A1c 6.0%.  No recent hypoglycemia.  Remains on Basaglar and metformin.  He also remains on Lipitor and lisinopril.  Past Medical History:  Diagnosis Date  . Acute lower GI bleeding 10/11/11  . Colon polyp   . CVA (cerebral vascular accident) ([place])   . DEGENERATIVE JOINT DISEASE 10/27/2008   Qualifier: Diagnosis of  By: Elease Hashimoto MD, Suzi Hernan    . DIAB W/O COMP TYPE II/UNS NOT STATED UNCNTRL 10/27/2008   Qualifier: Diagnosis of  By: Valma Cava LPN, Izora Gala    . Diverticulosis of colon with hemorrhage   . HYPERLIPIDEMIA 04/27/2009   Qualifier: Diagnosis of  By: Elease Hashimoto MD, Carlyn Mullenbach    . HYPERTENSION 10/27/2008   Qualifier: Diagnosis of  By: Valma Cava LPN, Izora Gala      Past Surgical History:  Procedure Laterality Date  . COLONOSCOPY  10/12/2011   Procedure: COLONOSCOPY;  Surgeon: Lafayette Dragon, MD;  Location: [facility] ENDOSCOPY;  Service: Endoscopy;  Laterality: N/A;  . COLONOSCOPY WITH PROPOFOL N/A 12/16/2019   Procedure: COLONOSCOPY WITH PROPOFOL;  Surgeon: Ladene Artist, MD;  Location: [facility] ENDOSCOPY;  Service: Endoscopy;  Laterality: N/A;  . ESOPHAGOGASTRODUODENOSCOPY  10/12/2011   Procedure:  ESOPHAGOGASTRODUODENOSCOPY (EGD);  Surgeon: Lafayette Dragon, MD;  Location: [facility] ENDOSCOPY;  Service: Endoscopy;  Laterality: N/A;  . ESOPHAGOGASTRODUODENOSCOPY (EGD) WITH PROPOFOL N/A 12/16/2019   Procedure: ESOPHAGOGASTRODUODENOSCOPY (EGD) WITH PROPOFOL;  Surgeon: Ladene Artist, MD;  Location: [facility] ENDOSCOPY;  Service: Endoscopy;  Laterality: N/A;  . HEMOSTASIS CLIP PLACEMENT  12/16/2019   Procedure: HEMOSTASIS CLIP PLACEMENT;  Surgeon: Ladene Artist, MD;  Location: [facility] ENDOSCOPY;  Service: Endoscopy;;  . HERNIA REPAIR  2007   "navel"  . JOINT REPLACEMENT    . KNEE CARTILAGE SURGERY  1960's and 1970's   left; "2 total"  . POLYPECTOMY  12/16/2019   Procedure: POLYPECTOMY;  Surgeon: Ladene Artist, MD;  Location: [facility] ENDOSCOPY;  Service: Endoscopy;;  . TOTAL HIP ARTHROPLASTY  2007    Family History  Problem Relation Age of Onset  . Alcohol abuse Other        grandparent  . Arthritis Other        arthritis  . Diabetes Other        grandparent    Social History   Socioeconomic History  . Marital status: Married    Spouse name: Not on file  . Number of children: Not on file  . Years of education: Not on file  . Highest education level: Not on file  Occupational History  . Not on file  Tobacco Use  . Smoking status: Former Smoker    Packs/day: 0.30  Years: 15.00    Pack years: 4.50    Types: Cigarettes    Quit date: 10/11/1978    Years since quitting: 41.5  . Smokeless tobacco: Never Used  Vaping Use  . Vaping Use: Never used  Substance and Sexual Activity  . Alcohol use: Not Currently    Comment: 10/11/11 "last alcohol 27-28 years ago"  . Drug use: Yes    Types: Marijuana    Comment: "recreational marijuana in [employer]; 1970's"  . Sexual activity: Not Currently  Other Topics Concern  . Not on file  Social History Narrative  . Not on file   Social Determinants of Health   Financial Resource Strain: Not on file  Food Insecurity: Not on file  Transportation Needs:  Not on file  Physical Activity: Not on file  Stress: Not on file  Social Connections: Not on file  Intimate Partner Violence: Not on file    Outpatient Medications Prior to Visit  Medication Sig Dispense Refill  . atorvastatin (LIPITOR) 20 MG tablet Take 1 tablet by mouth once daily 90 tablet 0  . gabapentin (NEURONTIN) 100 MG capsule Take 2 capsules by mouth twice daily 360 capsule 0  . Insulin Glargine (BASAGLAR KWIKPEN) 100 UNIT/ML INJECT 30 UNITS SUBCUTANEOUSLY AT BEDTIME 15 mL 0  . lisinopril (ZESTRIL) 20 MG tablet Take 1 tablet (20 mg total) by mouth daily. 90 tablet 3  . metFORMIN (GLUCOPHAGE) 500 MG tablet Take 2 tablets by mouth twice daily 360 tablet 0   No facility-administered medications prior to visit.    No Known Allergies  ROS Review of Systems  Constitutional: Negative for unexpected weight change.  Eyes: Negative for visual disturbance.  Respiratory: Negative for cough, chest tightness and shortness of breath.   Cardiovascular: Negative for chest pain, palpitations and leg swelling.  Endocrine: Negative for polydipsia and polyuria.  Neurological: Negative for dizziness, syncope, weakness, light-headedness and headaches.      Objective:    Physical Exam Constitutional:      Appearance: He is well-developed and well-nourished.  HENT:     Mouth/Throat:     Mouth: Oropharynx is clear and moist.  Eyes:     Pupils: Pupils are equal, round, and reactive to light.  Neck:     Thyroid: No thyromegaly.  Cardiovascular:     Rate and Rhythm: Normal rate and regular rhythm.  Pulmonary:     Effort: Pulmonary effort is normal. No respiratory distress.     Breath sounds: Normal breath sounds. No wheezing or rales.  Musculoskeletal:        General: No edema.     Cervical back: Neck supple.     Right lower leg: No edema.     Left lower leg: No edema.  Neurological:     Mental Status: He is alert and oriented to person, place, and time.     BP (!) 182/100 (BP  Location: Left Arm, Cuff Size: Normal)   Pulse 79   Ht 6\' 2"  (1.88 m)   Wt 239 lb (108.4 kg)   SpO2 99%   BMI 30.69 kg/m  Wt Readings from Last 3 Encounters:  04/25/20 239 lb (108.4 kg)  02/22/20 233 lb 11.2 oz (106 kg)  02/16/20 238 lb 3.2 oz (108 kg)     Health Maintenance Due  Topic Date Due  . TETANUS/TDAP  Never done  . PNA vac Low Risk Adult (1 of 2 - PCV13) Never done  . COVID-19 Vaccine (3 - Booster for [employer] series)  01/29/2020    There are no preventive care reminders to display for this patient.  Lab Results  Component Value Date   TSH 2.66 07/15/2017   Lab Results  Component Value Date   WBC 6.5 03/18/2020   HGB 10.2 (L) 03/18/2020   HCT 32.6 (L) 03/18/2020   MCV 72.4 (L) 03/18/2020   PLT 290.0 03/18/2020   Lab Results  Component Value Date   NA 138 04/14/2020   K 4.4 04/14/2020   CO2 28 04/14/2020   GLUCOSE 186 (H) 04/14/2020   BUN 18 04/14/2020   CREATININE 1.42 04/14/2020   BILITOT 0.5 01/08/2020   ALKPHOS 38 01/08/2020   AST 11 (L) 01/08/2020   ALT 10 01/08/2020   PROT 5.9 (L) 01/08/2020   ALBUMIN 3.3 (L) 01/08/2020   CALCIUM 9.4 04/14/2020   ANIONGAP 8 01/08/2020   GFR 48.91 (L) 04/14/2020   Lab Results  Component Value Date   CHOL 107 12/15/2019   Lab Results  Component Value Date   HDL 36 (L) 12/15/2019   Lab Results  Component Value Date   LDLCALC 59 12/15/2019   Lab Results  Component Value Date   TRIG 61 12/15/2019   Lab Results  Component Value Date   CHOLHDL 3.0 12/15/2019   Lab Results  Component Value Date   HGBA1C 6.0 (A) 04/25/2020      Assessment & Plan:   #1 type 2 diabetes well controlled with A1c today 6.0%.  Continue current regimen of Basaglar and metformin  #2 hypertension poorly controlled.  Goal blood pressure less than 130/80 with prior history of CVA.  -Continue lisinopril 20 mg daily -Add amlodipine 5 mg once daily -He is encouraged to try to slowly build back his exercise with walking and  try to lose some weight -Reassess blood pressure in 1 month  #3 history of anemia secondary to GI bleed from diverticulosis -Continue close follow-up with GI  Meds ordered this encounter  Medications  . amLODipine (NORVASC) 5 MG tablet    Sig: Take 1 tablet (5 mg total) by mouth daily.    Dispense:  90 tablet    Refill:  3    Follow-up: Return in about 1 month (around 05/26/2020).    Carolann Littler, MD

## 2020-04-25 NOTE — H&P (View-Only) (Signed)
Established Patient Office Visit  Subjective:  Patient ID: Carl Lam, male    DOB: 1946-04-01  Age: 75 y.o. MRN: [id number]  CC:  Chief Complaint  Patient presents with  . Diabetes    HPI [name] presents for medical follow-up.  He has type 2 diabetes, hypertension, history of CVA, history of anemia secondary to diverticulosis bleeds, hyperlipidemia.  He has been followed by GI regarding his anemia.  Last hemoglobin 10.2.  He feels his energy levels are improving somewhat.  His exercise has been curtailed since his stroke.  He hopes to start back some walking soon.  Blood pressure is quite elevated today.  This has been sporadically elevated in the past.  He takes lisinopril 20 mg daily.  No headaches or dizziness.  No chest pains.  Fasting blood sugar this morning 115.  Last A1c 6.0%.  No recent hypoglycemia.  Remains on Basaglar and metformin.  He also remains on Lipitor and lisinopril.  Past Medical History:  Diagnosis Date  . Acute lower GI bleeding 10/11/11  . Colon polyp   . CVA (cerebral vascular accident) ([place])   . DEGENERATIVE JOINT DISEASE 10/27/2008   Qualifier: Diagnosis of  By: Elease Hashimoto MD, Kayceon Oki    . DIAB W/O COMP TYPE II/UNS NOT STATED UNCNTRL 10/27/2008   Qualifier: Diagnosis of  By: Valma Cava LPN, Izora Gala    . Diverticulosis of colon with hemorrhage   . HYPERLIPIDEMIA 04/27/2009   Qualifier: Diagnosis of  By: Elease Hashimoto MD, Altariq Goodall    . HYPERTENSION 10/27/2008   Qualifier: Diagnosis of  By: Valma Cava LPN, Izora Gala      Past Surgical History:  Procedure Laterality Date  . COLONOSCOPY  10/12/2011   Procedure: COLONOSCOPY;  Surgeon: Lafayette Dragon, MD;  Location[facility] ENDOSCOPY;  Service: Endoscopy;  Laterality: N/A;  . COLONOSCOPY WITH PROPOFOL N/A 12/16/2019   Procedure: COLONOSCOPY WITH PROPOFOL;  Surgeon: Ladene Artist, MD;  Location: [facility] ENDOSCOPY;  Service: Endoscopy;  Laterality: N/A;  . ESOPHAGOGASTRODUODENOSCOPY  10/12/2011   Procedure:  ESOPHAGOGASTRODUODENOSCOPY (EGD);  Surgeon: Lafayette Dragon, MD;  Location: [facility] ENDOSCOPY;  Service: Endoscopy;  Laterality: N/A;  . ESOPHAGOGASTRODUODENOSCOPY (EGD) WITH PROPOFOL N/A 12/16/2019   Procedure: ESOPHAGOGASTRODUODENOSCOPY (EGD) WITH PROPOFOL;  Surgeon: Ladene Artist, MD;  Location: [facility] ENDOSCOPY;  Service: Endoscopy;  Laterality: N/A;  . HEMOSTASIS CLIP PLACEMENT  12/16/2019   Procedure: HEMOSTASIS CLIP PLACEMENT;  Surgeon: Ladene Artist, MD;  Location: [facility] ENDOSCOPY;  Service: Endoscopy;;  . HERNIA REPAIR  2007   "navel"  . JOINT REPLACEMENT    . KNEE CARTILAGE SURGERY  1960's and 1970's   left; "2 total"  . POLYPECTOMY  12/16/2019   Procedure: POLYPECTOMY;  Surgeon: Ladene Artist, MD;  Location: [facility] ENDOSCOPY;  Service: Endoscopy;;  . TOTAL HIP ARTHROPLASTY  2007    Family History  Problem Relation Age of Onset  . Alcohol abuse Other        grandparent  . Arthritis Other        arthritis  . Diabetes Other        grandparent    Social History   Socioeconomic History  . Marital status: Married    Spouse name: Not on file  . Number of children: Not on file  . Years of education: Not on file  . Highest education level: Not on file  Occupational History  . Not on file  Tobacco Use  . Smoking status: Former Smoker    Packs/day: 0.30  Years: 15.00    Pack years: 4.50    Types: Cigarettes    Quit date: 10/11/1978    Years since quitting: 41.5  . Smokeless tobacco: Never Used  Vaping Use  . Vaping Use: Never used  Substance and Sexual Activity  . Alcohol use: Not Currently    Comment: 10/11/11 "last alcohol 27-28 years ago"  . Drug use: Yes    Types: Marijuana    Comment: "recreational marijuana in [employer]; 1970's"  . Sexual activity: Not Currently  Other Topics Concern  . Not on file  Social History Narrative  . Not on file   Social Determinants of Health   Financial Resource Strain: Not on file  Food Insecurity: Not on file  Transportation Needs:  Not on file  Physical Activity: Not on file  Stress: Not on file  Social Connections: Not on file  Intimate Partner Violence: Not on file    Outpatient Medications Prior to Visit  Medication Sig Dispense Refill  . atorvastatin (LIPITOR) 20 MG tablet Take 1 tablet by mouth once daily 90 tablet 0  . gabapentin (NEURONTIN) 100 MG capsule Take 2 capsules by mouth twice daily 360 capsule 0  . Insulin Glargine (BASAGLAR KWIKPEN) 100 UNIT/ML INJECT 30 UNITS SUBCUTANEOUSLY AT BEDTIME 15 mL 0  . lisinopril (ZESTRIL) 20 MG tablet Take 1 tablet (20 mg total) by mouth daily. 90 tablet 3  . metFORMIN (GLUCOPHAGE) 500 MG tablet Take 2 tablets by mouth twice daily 360 tablet 0   No facility-administered medications prior to visit.    No Known Allergies  ROS Review of Systems  Constitutional: Negative for unexpected weight change.  Eyes: Negative for visual disturbance.  Respiratory: Negative for cough, chest tightness and shortness of breath.   Cardiovascular: Negative for chest pain, palpitations and leg swelling.  Endocrine: Negative for polydipsia and polyuria.  Neurological: Negative for dizziness, syncope, weakness, light-headedness and headaches.      Objective:    Physical Exam Constitutional:      Appearance: He is well-developed and well-nourished.  HENT:     Mouth/Throat:     Mouth: Oropharynx is clear and moist.  Eyes:     Pupils: Pupils are equal, round, and reactive to light.  Neck:     Thyroid: No thyromegaly.  Cardiovascular:     Rate and Rhythm: Normal rate and regular rhythm.  Pulmonary:     Effort: Pulmonary effort is normal. No respiratory distress.     Breath sounds: Normal breath sounds. No wheezing or rales.  Musculoskeletal:        General: No edema.     Cervical back: Neck supple.     Right lower leg: No edema.     Left lower leg: No edema.  Neurological:     Mental Status: He is alert and oriented to person, place, and time.     BP (!) 182/100 (BP  Location: Left Arm, Cuff Size: Normal)   Pulse 79   Ht 6\' 2"  (1.88 m)   Wt 239 lb (108.4 kg)   SpO2 99%   BMI 30.69 kg/m  Wt Readings from Last 3 Encounters:  04/25/20 239 lb (108.4 kg)  02/22/20 233 lb 11.2 oz (106 kg)  02/16/20 238 lb 3.2 oz (108 kg)     Health Maintenance Due  Topic Date Due  . TETANUS/TDAP  Never done  . PNA vac Low Risk Adult (1 of 2 - PCV13) Never done  . COVID-19 Vaccine (3 - Booster for [employer] series)  01/29/2020    There are no preventive care reminders to display for this patient.  Lab Results  Component Value Date   TSH 2.66 07/15/2017   Lab Results  Component Value Date   WBC 6.5 03/18/2020   HGB 10.2 (L) 03/18/2020   HCT 32.6 (L) 03/18/2020   MCV 72.4 (L) 03/18/2020   PLT 290.0 03/18/2020   Lab Results  Component Value Date   NA 138 04/14/2020   K 4.4 04/14/2020   CO2 28 04/14/2020   GLUCOSE 186 (H) 04/14/2020   BUN 18 04/14/2020   CREATININE 1.42 04/14/2020   BILITOT 0.5 01/08/2020   ALKPHOS 38 01/08/2020   AST 11 (L) 01/08/2020   ALT 10 01/08/2020   PROT 5.9 (L) 01/08/2020   ALBUMIN 3.3 (L) 01/08/2020   CALCIUM 9.4 04/14/2020   ANIONGAP 8 01/08/2020   GFR 48.91 (L) 04/14/2020   Lab Results  Component Value Date   CHOL 107 12/15/2019   Lab Results  Component Value Date   HDL 36 (L) 12/15/2019   Lab Results  Component Value Date   LDLCALC 59 12/15/2019   Lab Results  Component Value Date   TRIG 61 12/15/2019   Lab Results  Component Value Date   CHOLHDL 3.0 12/15/2019   Lab Results  Component Value Date   HGBA1C 6.0 (A) 04/25/2020      Assessment & Plan:   #1 type 2 diabetes well controlled with A1c today 6.0%.  Continue current regimen of Basaglar and metformin  #2 hypertension poorly controlled.  Goal blood pressure less than 130/80 with prior history of CVA.  -Continue lisinopril 20 mg daily -Add amlodipine 5 mg once daily -He is encouraged to try to slowly build back his exercise with walking and  try to lose some weight -Reassess blood pressure in 1 month  #3 history of anemia secondary to GI bleed from diverticulosis -Continue close follow-up with GI  Meds ordered this encounter  Medications  . amLODipine (NORVASC) 5 MG tablet    Sig: Take 1 tablet (5 mg total) by mouth daily.    Dispense:  90 tablet    Refill:  3    Follow-up: Return in about 1 month (around 05/26/2020).    Carolann Littler, MD

## 2020-05-03 ENCOUNTER — Encounter (HOSPITAL_COMMUNITY): Payer: Self-pay | Admitting: Gastroenterology

## 2020-05-09 ENCOUNTER — Other Ambulatory Visit (HOSPITAL_COMMUNITY)
Admission: RE | Admit: 2020-05-09 | Discharge: 2020-05-09 | Disposition: A | Discharge status: Home / Self Care | Payer: Federal, State, Local not specified - PPO | Source: Ambulatory Visit | Attending: Gastroenterology | Admitting: Gastroenterology

## 2020-05-09 DIAGNOSIS — Z20822 Contact with and (suspected) exposure to covid-19: Secondary | ICD-10-CM | POA: Diagnosis not present

## 2020-05-09 DIAGNOSIS — K869 Disease of pancreas, unspecified: Secondary | ICD-10-CM | POA: Diagnosis present

## 2020-05-09 DIAGNOSIS — Z794 Long term (current) use of insulin: Secondary | ICD-10-CM | POA: Diagnosis not present

## 2020-05-09 DIAGNOSIS — Z87891 Personal history of nicotine dependence: Secondary | ICD-10-CM | POA: Diagnosis not present

## 2020-05-09 DIAGNOSIS — I10 Essential (primary) hypertension: Secondary | ICD-10-CM | POA: Diagnosis not present

## 2020-05-09 DIAGNOSIS — E119 Type 2 diabetes mellitus without complications: Secondary | ICD-10-CM | POA: Diagnosis not present

## 2020-05-09 DIAGNOSIS — Z8673 Personal history of transient ischemic attack (TIA), and cerebral infarction without residual deficits: Secondary | ICD-10-CM | POA: Diagnosis not present

## 2020-05-09 DIAGNOSIS — D649 Anemia, unspecified: Secondary | ICD-10-CM | POA: Diagnosis not present

## 2020-05-09 DIAGNOSIS — Z7984 Long term (current) use of oral hypoglycemic drugs: Secondary | ICD-10-CM | POA: Diagnosis not present

## 2020-05-09 DIAGNOSIS — Z01812 Encounter for preprocedural laboratory examination: Secondary | ICD-10-CM | POA: Insufficient documentation

## 2020-05-09 DIAGNOSIS — Z79899 Other long term (current) drug therapy: Secondary | ICD-10-CM | POA: Diagnosis not present

## 2020-05-09 DIAGNOSIS — K862 Cyst of pancreas: Secondary | ICD-10-CM | POA: Diagnosis not present

## 2020-05-09 LAB — SARS CORONAVIRUS 2 (TAT 6-24 HRS): SARS Coronavirus 2: NEGATIVE

## 2020-05-11 NOTE — Anesthesia Preprocedure Evaluation (Signed)
Anesthesia Evaluation  Patient identified by MRN, date of birth, ID band Patient awake    Reviewed: Allergy & Precautions, NPO status , Patient's Chart, lab work & pertinent test results  Airway Mallampati: II  TM Distance: >3 FB Neck ROM: Full    Dental no notable dental hx. (+) Dental Advisory Given, Upper Dentures, Lower Dentures   Pulmonary Patient abstained from smoking., former smoker,    Pulmonary exam normal breath sounds clear to auscultation       Cardiovascular Exercise Tolerance: Good hypertension, Pt. on medications negative cardio ROS Normal cardiovascular exam Rhythm:Regular Rate:Normal     Neuro/Psych CVA (in 2010)    GI/Hepatic negative GI ROS, Neg liver ROS,   Endo/Other  diabetes, Type 2, Oral Hypoglycemic Agents  Renal/GU negative Renal ROS     Musculoskeletal   Abdominal (+) + obese,   Peds  Hematology   Anesthesia Other Findings   Reproductive/Obstetrics                            Anesthesia Physical Anesthesia Plan  ASA: II  Anesthesia Plan: MAC   Post-op Pain Management:    Induction:   PONV Risk Score and Plan: Treatment may vary due to age or medical condition and Ondansetron  Airway Management Planned: Nasal Cannula and Natural Airway  Additional Equipment: None  Intra-op Plan:   Post-operative Plan:   Informed Consent: I have reviewed the patients History and Physical, chart, labs and discussed the procedure including the risks, benefits and alternatives for the proposed anesthesia with the patient or authorized representative who has indicated his/her understanding and acceptance.     Dental advisory given  Plan Discussed with:   Anesthesia Plan Comments: (? Abnormal pancreas for [place])       Anesthesia Quick Evaluation

## 2020-05-12 ENCOUNTER — Ambulatory Visit (HOSPITAL_COMMUNITY): Payer: Federal, State, Local not specified - PPO | Admitting: Anesthesiology

## 2020-05-12 ENCOUNTER — Other Ambulatory Visit: Payer: Self-pay

## 2020-05-12 ENCOUNTER — Encounter (HOSPITAL_COMMUNITY): Payer: Self-pay | Admitting: Gastroenterology

## 2020-05-12 ENCOUNTER — Ambulatory Visit (HOSPITAL_COMMUNITY)
Admission: RE | Admit: 2020-05-12 | Discharge: 2020-05-12 | Disposition: A | Discharge status: Home / Self Care | Payer: Federal, State, Local not specified - PPO | Attending: Gastroenterology | Admitting: Gastroenterology

## 2020-05-12 ENCOUNTER — Encounter (HOSPITAL_COMMUNITY)
Admission: RE | Disposition: A | Discharge status: Home / Self Care | Payer: Self-pay | Source: Home / Self Care | Attending: Gastroenterology

## 2020-05-12 DIAGNOSIS — Z8673 Personal history of transient ischemic attack (TIA), and cerebral infarction without residual deficits: Secondary | ICD-10-CM | POA: Insufficient documentation

## 2020-05-12 DIAGNOSIS — Z7984 Long term (current) use of oral hypoglycemic drugs: Secondary | ICD-10-CM | POA: Insufficient documentation

## 2020-05-12 DIAGNOSIS — I10 Essential (primary) hypertension: Secondary | ICD-10-CM | POA: Insufficient documentation

## 2020-05-12 DIAGNOSIS — K8689 Other specified diseases of pancreas: Secondary | ICD-10-CM

## 2020-05-12 DIAGNOSIS — E119 Type 2 diabetes mellitus without complications: Secondary | ICD-10-CM | POA: Insufficient documentation

## 2020-05-12 DIAGNOSIS — R948 Abnormal results of function studies of other organs and systems: Secondary | ICD-10-CM

## 2020-05-12 DIAGNOSIS — R933 Abnormal findings on diagnostic imaging of other parts of digestive tract: Secondary | ICD-10-CM

## 2020-05-12 DIAGNOSIS — Z794 Long term (current) use of insulin: Secondary | ICD-10-CM | POA: Insufficient documentation

## 2020-05-12 DIAGNOSIS — Z79899 Other long term (current) drug therapy: Secondary | ICD-10-CM | POA: Insufficient documentation

## 2020-05-12 DIAGNOSIS — K869 Disease of pancreas, unspecified: Secondary | ICD-10-CM | POA: Insufficient documentation

## 2020-05-12 DIAGNOSIS — Z20822 Contact with and (suspected) exposure to covid-19: Secondary | ICD-10-CM | POA: Insufficient documentation

## 2020-05-12 DIAGNOSIS — K862 Cyst of pancreas: Secondary | ICD-10-CM | POA: Insufficient documentation

## 2020-05-12 DIAGNOSIS — D649 Anemia, unspecified: Secondary | ICD-10-CM | POA: Insufficient documentation

## 2020-05-12 DIAGNOSIS — Z87891 Personal history of nicotine dependence: Secondary | ICD-10-CM | POA: Insufficient documentation

## 2020-05-12 HISTORY — PX: EUS: SHX5427

## 2020-05-12 HISTORY — PX: ESOPHAGOGASTRODUODENOSCOPY (EGD) WITH PROPOFOL: SHX5813

## 2020-05-12 LAB — GLUCOSE, CAPILLARY
Glucose-Capillary: 143 mg/dL — ABNORMAL HIGH (ref 70–99)
Glucose-Capillary: 110 mg/dL — ABNORMAL HIGH (ref 70–99)

## 2020-05-12 SURGERY — UPPER ENDOSCOPIC ULTRASOUND (EUS) RADIAL
Anesthesia: Monitor Anesthesia Care

## 2020-05-12 MED ORDER — SODIUM CHLORIDE 0.9 % IV SOLN: INTRAVENOUS | Status: DC

## 2020-05-12 MED ORDER — LACTATED RINGERS IV SOLN: INTRAVENOUS | Status: DC

## 2020-05-12 MED ORDER — PROPOFOL 500 MG/50ML IV EMUL
INTRAVENOUS | Status: DC | PRN
  Administered 2020-05-12: 150 ug/kg/min via INTRAVENOUS

## 2020-05-12 NOTE — Discharge Instructions (Signed)
YOU HAD AN ENDOSCOPIC PROCEDURE TODAY: Refer to the procedure report and other information in the discharge instructions given to you for any specific questions about what was found during the examination. If this information does not answer your questions, please call [place] office at [phone] to clarify.  ° °YOU SHOULD EXPECT: Some feelings of bloating in the abdomen. Passage of more gas than usual. Walking can help get rid of the air that was put into your GI tract during the procedure and reduce the bloating. If you had a lower endoscopy (such as a colonoscopy or flexible sigmoidoscopy) you may notice spotting of blood in your stool or on the toilet paper. Some abdominal soreness may be present for a day or two, also. ° °DIET: Your first meal following the procedure should be a light meal and then it is ok to progress to your normal diet. A half-sandwich or bowl of soup is an example of a good first meal. Heavy or fried foods are harder to digest and may make you feel nauseous or bloated. Drink plenty of fluids but you should avoid alcoholic beverages for 24 hours. If you had a esophageal dilation, please see attached instructions for diet.   ° °ACTIVITY: Your care partner should take you home directly after the procedure. You should plan to take it easy, moving slowly for the rest of the day. You can resume normal activity the day after the procedure however YOU SHOULD NOT DRIVE, use power tools, machinery or perform tasks that involve climbing or major physical exertion for 24 hours (because of the sedation medicines used during the test).  ° °SYMPTOMS TO REPORT IMMEDIATELY: °A gastroenterologist can be reached at any hour. Please call [phone]  for any of the following symptoms:  °Following lower endoscopy (colonoscopy, flexible sigmoidoscopy) °Excessive amounts of blood in the stool  °Significant tenderness, worsening of abdominal pains  °Swelling of the abdomen that is new, acute  °Fever of 100° or  higher  °Following upper endoscopy (EGD, EUS, ERCP, esophageal dilation) °Vomiting of blood or coffee ground material  °New, significant abdominal pain  °New, significant chest pain or pain under the shoulder blades  °Painful or persistently difficult swallowing  °New shortness of breath  °Black, tarry-looking or red, bloody stools ° °FOLLOW UP:  °If any biopsies were taken you will be contacted by phone or by letter within the next 1-3 weeks. Call [phone]  if you have not heard about the biopsies in 3 weeks.  °Please also call with any specific questions about appointments or follow up tests. ° °

## 2020-05-12 NOTE — Transfer of Care (Signed)
Immediate Anesthesia Transfer of Care Note  Patient: Carl Lam  Procedure(s) Performed: UPPER ENDOSCOPIC ULTRASOUND (EUS) RADIAL (N/A )  Patient Location: PACU  Anesthesia Type:MAC  Level of Consciousness: sedated, drowsy and patient cooperative  Airway & Oxygen Therapy: Patient Spontanous Breathing and Patient connected to face mask oxygen  Post-op Assessment: Report given to RN and Post -op Vital signs reviewed and stable  Post vital signs: Reviewed and stable  Last Vitals:  Vitals Value Taken Time  BP 108/70 05/12/20 0916  Temp    Pulse 92 05/12/20 0918  Resp 19 05/12/20 0918  SpO2 100 % 05/12/20 0918  Vitals shown include unvalidated device data.  Last Pain:  Vitals:   05/12/20 0741  TempSrc: Oral  PainSc: 0-No pain         Complications: No complications documented.

## 2020-05-12 NOTE — Op Note (Signed)
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 Tilden Dome, RN Referring MD:             Jolly Mango, MD Medicines:                Monitored Anesthesia Care Complications:            No immediate complications. Estimated blood loss:                            None. Estimated Blood Loss:     Estimated blood loss: none. Procedure:                Pre-Anesthesia Assessment:                           - Prior to the procedure, a History and Physical                            was performed, and patient medications and                            allergies were reviewed. The patient's tolerance of                            previous anesthesia was also reviewed. The risks                            and benefits of the procedure and the sedation                            options and risks were discussed with the patient.                            All questions were answered, and informed consent                            was obtained. Prior Anticoagulants: The patient has  taken no previous anticoagulant or antiplatelet                            agents. ASA Grade Assessment: II - A patient with                            mild systemic disease. After reviewing the risks                            and benefits, the patient was deemed in                            satisfactory condition to undergo the procedure.                            After obtaining informed consent, the endoscope was                            passed under direct vision. Throughout the                            procedure, the patient's blood pressure, pulse, and                            oxygen saturations were monitored continuously. The                            [id number]-[id number] ([id number]) Olympus Radial EUS was                            introduced through the mouth, and advanced to the                            second part of duodenum. The [id number] ([id number])                            Olympus Linear EUS was introduced through the                            mouth, and advanced to the second part of duodenum.                            The upper EUS was accomplished without difficulty.                            The patient tolerated the procedure well. Scope In: Scope Out: Findings:      ENDOSCOPIC FINDING: :      The examined esophagus was endoscopically normal.      The entire examined stomach was endoscopically normal.      The examined duodenum was endoscopically normal.      ENDOSONOGRAPHIC FINDING: :      1. 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 The outer       margins are well defined. There were several large overlying blood       vessels between the lesion and the scope tip rendering FNA too risky.      2. Otherwise the pancreatic parenchyma was normal throughout.      3. CBD was normal, non-dilated.      4. Gallbladder was normal.      5. Limited views of the liver, spleen, portal and splenic vessels were       all normal. Impression:               - 2.4cm by 2.7cm multicytic lesion in the tail of                            the pancreas with internal dense thickened septea                            vs soft tissue component. The outer margins are                            well defined. There were several large overlying                             blood vessels between the lesion and the scope tip                            rendering FNA too risky.                           - Will discuss options with Dr. Havery Moros. I think                            MRI of pancreas is a reasonable next step. Moderate Sedation:      Not Applicable - Patient had care per Anesthesia. Recommendation:           - Discharge patient to home. Procedure Code(s):        --- Professional ---                           [phone], Esophagogastroduodenoscopy, flexible,                            transoral; with endoscopic ultrasound examination                            limited to the esophagus, stomach or duodenum, and                            adjacent structures Diagnosis Code(s):        --- Professional ---                           K86.89, Other specified diseases of pancreas  R93.3, Abnormal findings on diagnostic imaging of                            other parts of digestive tract CPT copyright 2019 [employer] Association. All rights reserved. The codes documented in this report are preliminary and upon coder review may  be revised to meet current compliance requirements. Milus Banister, MD 05/12/2020 9:25:18 AM This report has been signed electronically. Number of Addenda: 0

## 2020-05-12 NOTE — Interval H&P Note (Signed)
History and Physical Interval Note:  05/12/2020 7:13 AM  [place] Lam  has presented today for surgery, with the diagnosis of abnormal pancreas.  The various methods of treatment have been discussed with the patient and family. After consideration of risks, benefits and other options for treatment, the patient has consented to  Procedure(s): UPPER ENDOSCOPIC ULTRASOUND (EUS) RADIAL (N/A) as a surgical intervention.  The patient's history has been reviewed, patient examined, no change in status, stable for surgery.  I have reviewed the patient's chart and labs.  Questions were answered to the patient's satisfaction.     Milus Banister

## 2020-05-12 NOTE — Anesthesia Postprocedure Evaluation (Signed)
Anesthesia Post Note  Patient: [place] Lam  Procedure(s) Performed: UPPER ENDOSCOPIC ULTRASOUND (EUS) RADIAL (N/A )     Patient location during evaluation: Endoscopy Anesthesia Type: MAC Level of consciousness: awake and alert Pain management: pain level controlled Vital Signs Assessment: post-procedure vital signs reviewed and stable Respiratory status: spontaneous breathing, nonlabored ventilation, respiratory function stable and patient connected to nasal cannula oxygen Cardiovascular status: blood pressure returned to baseline and stable Postop Assessment: no apparent nausea or vomiting Anesthetic complications: no   No complications documented.  Last Vitals:  Vitals:   05/12/20 0918 05/12/20 0920  BP: 108/70   Pulse: 92   Resp: 19   Temp:  36.6 C  SpO2: 100%     Last Pain:  Vitals:   05/12/20 0930  TempSrc:   PainSc: 0-No pain                 Barnet Glasgow

## 2020-05-13 ENCOUNTER — Encounter (HOSPITAL_COMMUNITY): Payer: Self-pay | Admitting: Gastroenterology

## 2020-05-16 ENCOUNTER — Telehealth: Payer: Self-pay

## 2020-05-16 DIAGNOSIS — K869 Disease of pancreas, unspecified: Secondary | ICD-10-CM

## 2020-05-16 NOTE — Telephone Encounter (Signed)
Spoke with patient in regards to his MRCP appointment information, pt is aware that he will need to be NPO 4 hours prior. Patient requested that I call and remind him of the appointment. Patient verbalized understanding and had no concerns at the end of the call.   Reminder in epic.

## 2020-05-16 NOTE — Telephone Encounter (Signed)
-----   Message from Yetta Flock, MD sent at 05/16/2020  7:56 AM EST ----- Linna Hoff thanks for your help with this case. Sounds like a good plan.  Tona Qualley, can you please coordinate MRCP for this patient to evaluate pancreatic cystic lesion. We will use this to help determine next steps. Thanks  Richardson Landry ----- Message ----- From: Milus Banister, MD Sent: 05/12/2020   9:30 AM EST To: Yetta Flock, MD  Richardson Landry, see Epic for full report.     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 The outer margins are well defined.  There were several large overlying blood vessels between the lesion and the scope tip rendering FNA too risky. - Will discuss options with Dr. Havery Moros.  I think MRI of pancreas is a reasonable next step.    I couldn't sample this lesion because of the overlying large blood vessels.  Options are 1) have IR try to sample it, I think they have a 'window' 2) have it resected surgically, should be pretty easy in the tail of pancreas or 3) MRI for further information first.   I think MRI is best next step, if this shows no concerning features then it could be followed with serial MR safely.

## 2020-05-16 NOTE — Telephone Encounter (Signed)
Patient has been scheduled for an MRCP at [facility] on 05/26/20 at 1 PM. NPO 4 hours prior.   Lm on vm for patient to return call for appointment information.

## 2020-05-26 ENCOUNTER — Telehealth: Payer: Self-pay

## 2020-05-26 ENCOUNTER — Ambulatory Visit (HOSPITAL_COMMUNITY)
Admission: RE | Admit: 2020-05-26 | Discharge: 2020-05-26 | Disposition: A | Discharge status: Home / Self Care | Payer: Federal, State, Local not specified - PPO | Source: Ambulatory Visit | Attending: Gastroenterology | Admitting: Gastroenterology

## 2020-05-26 ENCOUNTER — Other Ambulatory Visit: Payer: Self-pay | Admitting: Gastroenterology

## 2020-05-26 ENCOUNTER — Other Ambulatory Visit: Payer: Self-pay

## 2020-05-26 ENCOUNTER — Encounter (HOSPITAL_COMMUNITY): Payer: Self-pay

## 2020-05-26 DIAGNOSIS — K869 Disease of pancreas, unspecified: Secondary | ICD-10-CM

## 2020-05-26 NOTE — Progress Notes (Signed)
Failed attempt at MRI. Patient unable to tolerate the procedure due to claustrophobia. Spoke with Dr. Lynne Leader RN today, who confirmed that patient's hemostasis clips are Resolution Clips (MR Conditional).

## 2020-05-26 NOTE — Telephone Encounter (Signed)
Received call from [facility], [place], she wanted to clarify which type of hemostasis clips were used back in 12/16/19 procedure. Spoke with Larene Beach, [place] unit, she stated that clips are conditional for MRI and that Dr. Fuller Plan used the standard resolution clips and that they should have passed by now. I relayed this information to [facility] in radiology and she had no concerns at the end of the call.

## 2020-05-27 ENCOUNTER — Ambulatory Visit: Payer: Federal, State, Local not specified - PPO | Admitting: Family Medicine

## 2020-05-27 ENCOUNTER — Encounter: Payer: Self-pay | Admitting: Family Medicine

## 2020-05-27 VITALS — BP 130/80 | HR 90 | Ht 74.0 in | Wt 238.0 lb

## 2020-05-27 DIAGNOSIS — Z794 Long term (current) use of insulin: Secondary | ICD-10-CM | POA: Diagnosis not present

## 2020-05-27 DIAGNOSIS — I10 Essential (primary) hypertension: Secondary | ICD-10-CM | POA: Diagnosis not present

## 2020-05-27 DIAGNOSIS — E1165 Type 2 diabetes mellitus with hyperglycemia: Secondary | ICD-10-CM | POA: Diagnosis not present

## 2020-05-27 DIAGNOSIS — K8689 Other specified diseases of pancreas: Secondary | ICD-10-CM

## 2020-05-27 NOTE — Patient Instructions (Signed)

## 2020-05-27 NOTE — Progress Notes (Signed)
Established Patient Office Visit  Subjective:  Patient ID: [place], male    DOB: [birth year]  Age: 75 y.o. MRN: [id number]  CC:  Chief Complaint  Patient presents with  . Hypertension    HPI [place] presents for follow-up regarding hypertension.  He had very high reading of 182/100 last visit.  He already takes lisinopril and we added amlodipine 5 mg daily.  Blood pressure much improved today.  Denies any side effects from medication.  No dizziness.  No headaches.  No peripheral edema.  He has type 2 diabetes and blood sugars remained stable.  Recent A1c 6.0%.  He has been undergoing work-up through GI secondary to iron deficiency anemia.  He had capsule endoscopy.  He had CT abdomen pelvis which showed poorly defined lesion tail the pancreas.  Patient had scheduled MR CP but he has basically anxiety and phobia with getting into the MRI scanner and he declined the test.  He states that he would have to be "put to sleep "in order to have this test.  Fortunately, his appetite is good and he has had no weight loss.  No abdominal pain.  Past Medical History:  Diagnosis Date  . Acute lower GI bleeding 10/11/11  . Colon polyp   . CVA (cerebral vascular accident) ([place])   . DEGENERATIVE JOINT DISEASE 10/27/2008   Qualifier: Diagnosis of  By: Elease Hashimoto MD, Jacquis Paxton    . DIAB W/O COMP TYPE II/UNS NOT STATED UNCNTRL 10/27/2008   Qualifier: Diagnosis of  By: Valma Cava LPN, Izora Gala    . Diverticulosis of colon with hemorrhage   . HYPERLIPIDEMIA 04/27/2009   Qualifier: Diagnosis of  By: Elease Hashimoto MD, Jakiah Goree    . HYPERTENSION 10/27/2008   Qualifier: Diagnosis of  By: Valma Cava LPN, Izora Gala      Past Surgical History:  Procedure Laterality Date  . COLONOSCOPY  10/12/2011   Procedure: COLONOSCOPY;  Surgeon: Lafayette Dragon, MD;  Location: [facility] ENDOSCOPY;  Service: Endoscopy;  Laterality: N/A;  . COLONOSCOPY WITH PROPOFOL N/A 12/16/2019   Procedure: COLONOSCOPY WITH PROPOFOL;  Surgeon: Ladene Artist, MD;  Location: [facility] ENDOSCOPY;  Service: Endoscopy;  Laterality: N/A;  . ESOPHAGOGASTRODUODENOSCOPY  10/12/2011   Procedure: ESOPHAGOGASTRODUODENOSCOPY (EGD);  Surgeon: Lafayette Dragon, MD;  Location: [facility] ENDOSCOPY;  Service: Endoscopy;  Laterality: N/A;  . ESOPHAGOGASTRODUODENOSCOPY (EGD) WITH PROPOFOL N/A 12/16/2019   Procedure: ESOPHAGOGASTRODUODENOSCOPY (EGD) WITH PROPOFOL;  Surgeon: Ladene Artist, MD;  Location: [facility] ENDOSCOPY;  Service: Endoscopy;  Laterality: N/A;  . ESOPHAGOGASTRODUODENOSCOPY (EGD) WITH PROPOFOL N/A 05/12/2020   Procedure: ESOPHAGOGASTRODUODENOSCOPY (EGD) WITH PROPOFOL;  Surgeon: Milus Banister, MD;  Location: [facility] ENDOSCOPY;  Service: Endoscopy;  Laterality: N/A;  . EUS N/A 05/12/2020   Procedure: UPPER ENDOSCOPIC ULTRASOUND (EUS) RADIAL;  Surgeon: Milus Banister, MD;  Location: [facility] ENDOSCOPY;  Service: Endoscopy;  Laterality: N/A;  . HEMOSTASIS CLIP PLACEMENT  12/16/2019   Procedure: HEMOSTASIS CLIP PLACEMENT;  Surgeon: Ladene Artist, MD;  Location: [facility] ENDOSCOPY;  Service: Endoscopy;;  . HERNIA REPAIR  2007   "navel"  . JOINT REPLACEMENT    . KNEE CARTILAGE SURGERY  1960's and 1970's   left; "2 total"  . POLYPECTOMY  12/16/2019   Procedure: POLYPECTOMY;  Surgeon: Ladene Artist, MD;  Location: [facility] ENDOSCOPY;  Service: Endoscopy;;  . TOTAL HIP ARTHROPLASTY  2007    Family History  Problem Relation Age of Onset  . Alcohol abuse Other        grandparent  .  Arthritis Other        arthritis  . Diabetes Other        grandparent    Social History   Socioeconomic History  . Marital status: Married    Spouse name: Not on file  . Number of children: Not on file  . Years of education: Not on file  . Highest education level: Not on file  Occupational History  . Not on file  Tobacco Use  . Smoking status: Former Smoker    Packs/day: 0.30    Years: 15.00    Pack years: 4.50    Types: Cigarettes    Quit date: 10/11/1978    Years since quitting: 41.6  .  Smokeless tobacco: Never Used  Vaping Use  . Vaping Use: Never used  Substance and Sexual Activity  . Alcohol use: Not Currently    Comment: 10/11/11 "last alcohol 27-28 years ago"  . Drug use: Yes    Types: Marijuana    Comment: "recreational marijuana in [employer]; 1970's"  . Sexual activity: Not Currently  Other Topics Concern  . Not on file  Social History Narrative  . Not on file   Social Determinants of Health   Financial Resource Strain: Not on file  Food Insecurity: Not on file  Transportation Needs: Not on file  Physical Activity: Not on file  Stress: Not on file  Social Connections: Not on file  Intimate Partner Violence: Not on file    Outpatient Medications Prior to Visit  Medication Sig Dispense Refill  . amLODipine (NORVASC) 5 MG tablet Take 1 tablet (5 mg total) by mouth daily. 90 tablet 3  . atorvastatin (LIPITOR) 20 MG tablet Take 1 tablet by mouth once daily (Patient taking differently: Take 20 mg by mouth daily.) 90 tablet 0  . carboxymethylcellul-glycerin (EQ LUBRICATING EYE DROPS) 0.5-0.9 % ophthalmic solution Place 1 drop into both eyes daily.    Marland Kitchen gabapentin (NEURONTIN) 100 MG capsule Take 2 capsules by mouth twice daily (Patient taking differently: Take 200 mg by mouth 2 (two) times daily.) 360 capsule 0  . Insulin Glargine (BASAGLAR KWIKPEN) 100 UNIT/ML INJECT 30 UNITS SUBCUTANEOUSLY AT BEDTIME (Patient taking differently: Inject 30 Units into the skin daily at 12 noon.) 15 mL 0  . lisinopril (ZESTRIL) 20 MG tablet Take 1 tablet (20 mg total) by mouth daily. 90 tablet 3  . metFORMIN (GLUCOPHAGE) 500 MG tablet Take 2 tablets by mouth twice daily (Patient taking differently: Take 1,000 mg by mouth 2 (two) times daily with a meal.) 360 tablet 0  . tamsulosin (FLOMAX) 0.4 MG CAPS capsule Take 0.4 mg by mouth.     No facility-administered medications prior to visit.    No Known Allergies  ROS Review of Systems  Constitutional: Negative for fatigue.   Eyes: Negative for visual disturbance.  Respiratory: Negative for cough, chest tightness and shortness of breath.   Cardiovascular: Negative for chest pain, palpitations and leg swelling.  Endocrine: Negative for polydipsia and polyuria.  Neurological: Negative for dizziness, syncope, weakness, light-headedness and headaches.      Objective:    Physical Exam Constitutional:      Appearance: He is well-developed and well-nourished.  HENT:     Right Ear: External ear normal.     Left Ear: External ear normal.     Mouth/Throat:     Mouth: Oropharynx is clear and moist.  Eyes:     Pupils: Pupils are equal, round, and reactive to light.  Neck:  Thyroid: No thyromegaly.  Cardiovascular:     Rate and Rhythm: Normal rate and regular rhythm.  Pulmonary:     Effort: Pulmonary effort is normal. No respiratory distress.     Breath sounds: Normal breath sounds. No wheezing or rales.  Musculoskeletal:        General: No edema.     Cervical back: Neck supple.     Right lower leg: No edema.     Left lower leg: No edema.  Neurological:     Mental Status: He is alert and oriented to person, place, and time.     BP 130/80   Pulse 90   Ht 6\' 2"  (1.88 m)   Wt 238 lb (108 kg)   SpO2 99%   BMI 30.56 kg/m  Wt Readings from Last 3 Encounters:  05/27/20 238 lb (108 kg)  05/12/20 238 lb (108 kg)  04/25/20 239 lb (108.4 kg)     Health Maintenance Due  Topic Date Due  . TETANUS/TDAP  Never done  . PNA vac Low Risk Adult (1 of 2 - PCV13) Never done  . COVID-19 Vaccine (3 - Booster for Pfizer series) 01/29/2020    There are no preventive care reminders to display for this patient.  Lab Results  Component Value Date   TSH 2.66 07/15/2017   Lab Results  Component Value Date   WBC 6.5 03/18/2020   HGB 10.2 (L) 03/18/2020   HCT 32.6 (L) 03/18/2020   MCV 72.4 (L) 03/18/2020   PLT 290.0 03/18/2020   Lab Results  Component Value Date   NA 138 04/14/2020   K 4.4 04/14/2020    CO2 28 04/14/2020   GLUCOSE 186 (H) 04/14/2020   BUN 18 04/14/2020   CREATININE 1.42 04/14/2020   BILITOT 0.5 01/08/2020   ALKPHOS 38 01/08/2020   AST 11 (L) 01/08/2020   ALT 10 01/08/2020   PROT 5.9 (L) 01/08/2020   ALBUMIN 3.3 (L) 01/08/2020   CALCIUM 9.4 04/14/2020   ANIONGAP 8 01/08/2020   GFR 48.91 (L) 04/14/2020   Lab Results  Component Value Date   CHOL 107 12/15/2019   Lab Results  Component Value Date   HDL 36 (L) 12/15/2019   Lab Results  Component Value Date   LDLCALC 59 12/15/2019   Lab Results  Component Value Date   TRIG 61 12/15/2019   Lab Results  Component Value Date   CHOLHDL 3.0 12/15/2019   Lab Results  Component Value Date   HGBA1C 6.0 (A) 04/25/2020      Assessment & Plan:   #1 hypertension.  Greatly improved with recent addition of amlodipine.  Continue amlodipine 5 mg daily along with lisinopril 20 mg daily.  Our goal blood pressure for him with prior history of CVA is 130/80 or less  #2 type 2 diabetes well controlled with recent A1c 6.0%  -Continue current diabetes regimen.  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  Patient declined MRCP.  He will discuss further with GI  No orders of the defined types were placed in this encounter.   Follow-up: Return in about 3 months (around 08/24/2020).    Carolann Littler, MD

## 2020-06-05 ENCOUNTER — Other Ambulatory Visit: Payer: Self-pay | Admitting: Family Medicine

## 2020-06-09 ENCOUNTER — Other Ambulatory Visit: Payer: Self-pay | Admitting: Urology

## 2020-06-09 ENCOUNTER — Telehealth: Payer: Self-pay

## 2020-06-09 NOTE — Telephone Encounter (Signed)
Lm on vm for patient to return call 

## 2020-06-09 NOTE — Telephone Encounter (Signed)
-----   Message from Yetta Flock, MD sent at 06/09/2020  4:04 PM EST ----- Regarding: MRCP [facility] Lanell Dubie, Looks like this patient did not follow through with his MRCP that you scheduled. Can you contact him to see if he is willing to reschedule this exam to look at his pancreas? Thanks

## 2020-06-14 ENCOUNTER — Other Ambulatory Visit: Payer: Self-pay

## 2020-06-14 MED ORDER — DIAZEPAM 5 MG PO TABS: ORAL_TABLET | ORAL | 0 refills | Status: DC

## 2020-06-14 NOTE — Telephone Encounter (Signed)
The date that I rescheduled the patient's MRCP did not work for him, I provided the number to radiology scheduling so that he can reschedule at his convenience. Patient is aware that I will hold off sending in prescription until he has rescheduled because I do not know how long the pharmacy will hold it for him. Patient states that he will give me a call back once he has rescheduled. Patient verbalized understanding and had no concerns at the end of the call.

## 2020-06-14 NOTE — Telephone Encounter (Signed)
Spoke with patient, he states that he can't get in the MRI machine unless they give him some medicine, pt states that he is not usually claustrophobiic but the machine "takes his breath away". Patient states that if he is able to take something before then we can reschedule it for him. Please advise, thanks.

## 2020-06-14 NOTE — Telephone Encounter (Signed)
Patient rescheduled MRCP to 07/02/20 at 1 PM. Patient is aware that I am sending in the prescription and he can pick it up later this week, patient has been advised that he will need someone to drive him the day of his scan if he takes the medication. Patient verbalized understanding and had no concerns at the end of the call.   Prescription sent to pharmacy on file.

## 2020-06-14 NOTE — Telephone Encounter (Signed)
Okay, can give valium 5mg  tablet - 2 tabs total, take one or 2 tabs 30 minutes prior to the exam. Would be good to have someone drive him home if taking this. Thanks

## 2020-06-14 NOTE — Addendum Note (Signed)
Addended by: Yevette Edwards on: 06/14/2020 01:24 PM   Modules accepted: Orders

## 2020-06-22 ENCOUNTER — Encounter (HOSPITAL_BASED_OUTPATIENT_CLINIC_OR_DEPARTMENT_OTHER): Payer: Self-pay | Admitting: Urology

## 2020-06-22 ENCOUNTER — Other Ambulatory Visit: Payer: Self-pay

## 2020-06-22 NOTE — Progress Notes (Signed)
Spoke w/ via phone for pre-op interview---pt Lab needs dos----   Has lab appt 06-23-2020 for cbc bmp pt t & s 900 am            Lab results------echo 12-15-2019 epic, vas US aorta 07-19-2017 epic, ekg 12-04-2019 epic COVID test ------06-23-2020 1030 Arrive at -------900 am 06-27-2020 NPO after MN NO Solid Food.  Clear liquids from MN until---800 am then npo Med rec completed Medications to take morning of surgery -----amlodipine, atorvastatin, gabapentin, eye drop tamsulosin Diabetic medication -----none day of surgery Patient instructed to bring photo id and insurance card day of surgery Patient aware to have Driver (ride ) / caregiver   Wife agnes will stay for surgery and driver after overnight stay        for 24 hours after surgery  Patient Special Instructions -----pt given overnight stay instructions Pre-Op special Istructions -----none Patient verbalized understanding of instructions that were given at this phone interview. Patient denies shortness of breath, chest pain, fever, cough at this phone interview.

## 2020-06-23 ENCOUNTER — Encounter (HOSPITAL_COMMUNITY)
Admission: RE | Admit: 2020-06-23 | Discharge: 2020-06-23 | Disposition: A | Discharge status: Home / Self Care | Payer: Federal, State, Local not specified - PPO | Source: Ambulatory Visit | Attending: Urology | Admitting: Urology

## 2020-06-23 ENCOUNTER — Other Ambulatory Visit (HOSPITAL_COMMUNITY)
Admission: RE | Admit: 2020-06-23 | Discharge: 2020-06-23 | Disposition: A | Discharge status: Home / Self Care | Payer: Federal, State, Local not specified - PPO | Source: Ambulatory Visit | Attending: Urology | Admitting: Urology

## 2020-06-23 DIAGNOSIS — Z20822 Contact with and (suspected) exposure to covid-19: Secondary | ICD-10-CM | POA: Diagnosis not present

## 2020-06-23 DIAGNOSIS — Z01812 Encounter for preprocedural laboratory examination: Secondary | ICD-10-CM | POA: Diagnosis present

## 2020-06-23 LAB — BASIC METABOLIC PANEL
Anion gap: 7 (ref 5–15)
BUN: 19 mg/dL (ref 8–23)
CO2: 23 mmol/L (ref 22–32)
Calcium: 9.1 mg/dL (ref 8.9–10.3)
Chloride: 105 mmol/L (ref 98–111)
Creatinine, Ser: 1.46 mg/dL — ABNORMAL HIGH (ref 0.61–1.24)
GFR, Estimated: 50 mL/min — ABNORMAL LOW (ref >60)
Glucose, Bld: 245 mg/dL — ABNORMAL HIGH (ref 70–99)
Potassium: 5 mmol/L (ref 3.5–5.1)
Sodium: 135 mmol/L (ref 135–145)

## 2020-06-23 LAB — CBC
HCT: 39.7 % (ref 39.0–52.0)
Hemoglobin: 13 g/dL (ref 13.0–17.0)
MCH: 28.2 pg (ref 26.0–34.0)
MCHC: 32.7 g/dL (ref 30.0–36.0)
MCV: 86.1 fL (ref 80.0–100.0)
Platelets: 263 10*3/uL (ref 150–400)
RBC: 4.61 MIL/uL (ref 4.22–5.81)
RDW: 17.2 % — ABNORMAL HIGH (ref 11.5–15.5)
WBC: 5 10*3/uL (ref 4.0–10.5)
nRBC: 0 % (ref 0.0–0.2)

## 2020-06-23 LAB — PROTIME-INR
INR: 1 (ref 0.8–1.2)
Prothrombin Time: 12.7 seconds (ref 11.4–15.2)

## 2020-06-23 LAB — SARS CORONAVIRUS 2 (TAT 6-24 HRS): SARS Coronavirus 2: NEGATIVE

## 2020-06-25 ENCOUNTER — Other Ambulatory Visit (HOSPITAL_COMMUNITY): Payer: Federal, State, Local not specified - PPO

## 2020-06-26 ENCOUNTER — Other Ambulatory Visit: Payer: Self-pay | Admitting: Family Medicine

## 2020-06-27 ENCOUNTER — Ambulatory Visit (HOSPITAL_BASED_OUTPATIENT_CLINIC_OR_DEPARTMENT_OTHER): Payer: Federal, State, Local not specified - PPO | Admitting: Anesthesiology

## 2020-06-27 ENCOUNTER — Encounter (HOSPITAL_BASED_OUTPATIENT_CLINIC_OR_DEPARTMENT_OTHER)
Admission: RE | Disposition: A | Discharge status: Home / Self Care | Payer: Self-pay | Source: Home / Self Care | Attending: Urology

## 2020-06-27 ENCOUNTER — Encounter (HOSPITAL_BASED_OUTPATIENT_CLINIC_OR_DEPARTMENT_OTHER): Payer: Self-pay | Admitting: Urology

## 2020-06-27 ENCOUNTER — Observation Stay (HOSPITAL_BASED_OUTPATIENT_CLINIC_OR_DEPARTMENT_OTHER)
Admission: RE | Admit: 2020-06-27 | Discharge: 2020-06-28 | Disposition: A | Discharge status: Home / Self Care | Payer: Federal, State, Local not specified - PPO | Attending: Urology | Admitting: Urology

## 2020-06-27 DIAGNOSIS — Z8673 Personal history of transient ischemic attack (TIA), and cerebral infarction without residual deficits: Secondary | ICD-10-CM | POA: Insufficient documentation

## 2020-06-27 DIAGNOSIS — N401 Enlarged prostate with lower urinary tract symptoms: Principal | ICD-10-CM | POA: Insufficient documentation

## 2020-06-27 DIAGNOSIS — N320 Bladder-neck obstruction: Secondary | ICD-10-CM | POA: Diagnosis not present

## 2020-06-27 DIAGNOSIS — R338 Other retention of urine: Secondary | ICD-10-CM | POA: Diagnosis not present

## 2020-06-27 DIAGNOSIS — N400 Enlarged prostate without lower urinary tract symptoms: Secondary | ICD-10-CM | POA: Diagnosis present

## 2020-06-27 DIAGNOSIS — Z96641 Presence of right artificial hip joint: Secondary | ICD-10-CM | POA: Diagnosis not present

## 2020-06-27 DIAGNOSIS — Z79899 Other long term (current) drug therapy: Secondary | ICD-10-CM | POA: Diagnosis not present

## 2020-06-27 DIAGNOSIS — R3914 Feeling of incomplete bladder emptying: Secondary | ICD-10-CM | POA: Diagnosis not present

## 2020-06-27 DIAGNOSIS — I10 Essential (primary) hypertension: Secondary | ICD-10-CM | POA: Diagnosis not present

## 2020-06-27 DIAGNOSIS — Z87891 Personal history of nicotine dependence: Secondary | ICD-10-CM | POA: Diagnosis not present

## 2020-06-27 DIAGNOSIS — E119 Type 2 diabetes mellitus without complications: Secondary | ICD-10-CM | POA: Insufficient documentation

## 2020-06-27 DIAGNOSIS — Z7984 Long term (current) use of oral hypoglycemic drugs: Secondary | ICD-10-CM | POA: Insufficient documentation

## 2020-06-27 DIAGNOSIS — R3912 Poor urinary stream: Secondary | ICD-10-CM | POA: Diagnosis present

## 2020-06-27 HISTORY — PX: TRANSURETHRAL RESECTION OF PROSTATE: SHX73

## 2020-06-27 HISTORY — DX: Benign prostatic hyperplasia without lower urinary tract symptoms: N40.0

## 2020-06-27 HISTORY — DX: Other specified health status: Z78.9

## 2020-06-27 HISTORY — DX: Anemia, unspecified: D64.9

## 2020-06-27 HISTORY — DX: Dyspnea, unspecified: R06.00

## 2020-06-27 HISTORY — DX: Presence of spectacles and contact lenses: Z97.3

## 2020-06-27 HISTORY — DX: Presence of dental prosthetic device (complete) (partial): Z97.2

## 2020-06-27 LAB — GLUCOSE, CAPILLARY
Glucose-Capillary: 140 mg/dL — ABNORMAL HIGH (ref 70–99)
Glucose-Capillary: 127 mg/dL — ABNORMAL HIGH (ref 70–99)
Glucose-Capillary: 194 mg/dL — ABNORMAL HIGH (ref 70–99)
Glucose-Capillary: 186 mg/dL — ABNORMAL HIGH (ref 70–99)

## 2020-06-27 LAB — TYPE AND SCREEN
ABO/RH(D): O POS
Antibody Screen: NEGATIVE

## 2020-06-27 SURGERY — TURP (TRANSURETHRAL RESECTION OF PROSTATE)
Anesthesia: General | Site: Prostate

## 2020-06-27 MED ORDER — ATORVASTATIN CALCIUM 20 MG PO TABS
20.0000 mg | ORAL_TABLET | Freq: Every day | ORAL | Status: DC
Start: 2020-06-27 — End: 2020-06-28
  Administered 2020-06-27: 20 mg via ORAL
  Filled 2020-06-27: qty 1

## 2020-06-27 MED ORDER — CEFAZOLIN SODIUM-DEXTROSE 2-4 GM/100ML-% IV SOLN
2.0000 g | INTRAVENOUS | Status: AC
  Administered 2020-06-27: 2 g via INTRAVENOUS

## 2020-06-27 MED ORDER — INSULIN ASPART 100 UNIT/ML ~~LOC~~ SOLN
SUBCUTANEOUS | Status: AC
  Filled 2020-06-27: qty 1

## 2020-06-27 MED ORDER — ACETAMINOPHEN 325 MG PO TABS
ORAL_TABLET | ORAL | Status: AC
  Filled 2020-06-27: qty 2

## 2020-06-27 MED ORDER — PHENYLEPHRINE 40 MCG/ML (10ML) SYRINGE FOR IV PUSH (FOR BLOOD PRESSURE SUPPORT)
PREFILLED_SYRINGE | INTRAVENOUS | Status: AC
  Filled 2020-06-27: qty 10

## 2020-06-27 MED ORDER — FENTANYL CITRATE (PF) 100 MCG/2ML IJ SOLN
INTRAMUSCULAR | Status: AC
  Filled 2020-06-27: qty 2

## 2020-06-27 MED ORDER — CARBOXYMETHYLCELLUL-GLYCERIN 0.5-0.9 % OP SOLN: 1.0000 [drp] | Freq: Every day | OPHTHALMIC | Status: DC

## 2020-06-27 MED ORDER — OXYCODONE HCL 5 MG PO TABS
5.0000 mg | ORAL_TABLET | ORAL | Status: DC | PRN
  Administered 2020-06-27: 5 mg via ORAL

## 2020-06-27 MED ORDER — ONDANSETRON HCL 4 MG/2ML IJ SOLN
INTRAMUSCULAR | Status: AC
  Filled 2020-06-27: qty 2

## 2020-06-27 MED ORDER — ACETAMINOPHEN 325 MG PO TABS
650.0000 mg | ORAL_TABLET | ORAL | Status: DC | PRN
  Administered 2020-06-27: 650 mg via ORAL

## 2020-06-27 MED ORDER — FERROUS SULFATE 325 (65 FE) MG PO TBEC: 325.0000 mg | DELAYED_RELEASE_TABLET | Freq: Every day | ORAL | Status: DC

## 2020-06-27 MED ORDER — OXYBUTYNIN CHLORIDE 5 MG PO TABS
ORAL_TABLET | ORAL | Status: AC
  Filled 2020-06-27: qty 1

## 2020-06-27 MED ORDER — BELLADONNA ALKALOIDS-OPIUM 16.2-60 MG RE SUPP: 1.0000 | Freq: Four times a day (QID) | RECTAL | Status: DC | PRN

## 2020-06-27 MED ORDER — GABAPENTIN 100 MG PO CAPS
200.0000 mg | ORAL_CAPSULE | Freq: Two times a day (BID) | ORAL | Status: DC
  Administered 2020-06-27: 200 mg via ORAL

## 2020-06-27 MED ORDER — ONDANSETRON HCL 4 MG/2ML IJ SOLN: 4.0000 mg | Freq: Once | INTRAMUSCULAR | Status: DC | PRN

## 2020-06-27 MED ORDER — SODIUM CHLORIDE 0.9 % IR SOLN
3000.0000 mL | Status: DC
  Administered 2020-06-27: 3000 mL

## 2020-06-27 MED ORDER — DIPHENHYDRAMINE HCL 12.5 MG/5ML PO ELIX: 12.5000 mg | ORAL_SOLUTION | Freq: Four times a day (QID) | ORAL | Status: DC | PRN

## 2020-06-27 MED ORDER — DIPHENHYDRAMINE HCL 50 MG/ML IJ SOLN
12.5000 mg | Freq: Four times a day (QID) | INTRAMUSCULAR | Status: DC | PRN
Start: 2020-06-27 — End: 2020-06-28

## 2020-06-27 MED ORDER — LIDOCAINE 2% (20 MG/ML) 5 ML SYRINGE
INTRAMUSCULAR | Status: DC | PRN
  Administered 2020-06-27: 100 mg via INTRAVENOUS

## 2020-06-27 MED ORDER — OXYBUTYNIN CHLORIDE 5 MG PO TABS
5.0000 mg | ORAL_TABLET | Freq: Three times a day (TID) | ORAL | Status: DC | PRN
  Administered 2020-06-27: 5 mg via ORAL

## 2020-06-27 MED ORDER — HYDROCODONE-ACETAMINOPHEN 5-325 MG PO TABS: 1.0000 | ORAL_TABLET | ORAL | 0 refills | Status: DC | PRN

## 2020-06-27 MED ORDER — SODIUM CHLORIDE 0.9 % IR SOLN
Status: DC | PRN
  Administered 2020-06-27 (×3): 6000 mL via INTRAVESICAL

## 2020-06-27 MED ORDER — LACTATED RINGERS IV SOLN: INTRAVENOUS | Status: DC

## 2020-06-27 MED ORDER — ONDANSETRON HCL 4 MG/2ML IJ SOLN: 4.0000 mg | INTRAMUSCULAR | Status: DC | PRN

## 2020-06-27 MED ORDER — PROPOFOL 10 MG/ML IV BOLUS
INTRAVENOUS | Status: AC
  Filled 2020-06-27: qty 20

## 2020-06-27 MED ORDER — OXYCODONE HCL 5 MG PO TABS: 5.0000 mg | ORAL_TABLET | Freq: Once | ORAL | Status: DC | PRN

## 2020-06-27 MED ORDER — INSULIN ASPART 100 UNIT/ML ~~LOC~~ SOLN
0.0000 [IU] | Freq: Three times a day (TID) | SUBCUTANEOUS | Status: DC
  Administered 2020-06-27: 3 [IU] via SUBCUTANEOUS
  Administered 2020-06-28: 5 [IU] via SUBCUTANEOUS

## 2020-06-27 MED ORDER — AMLODIPINE BESYLATE 5 MG PO TABS
5.0000 mg | ORAL_TABLET | Freq: Every day | ORAL | Status: DC
  Filled 2020-06-27: qty 1

## 2020-06-27 MED ORDER — AMISULPRIDE (ANTIEMETIC) 5 MG/2ML IV SOLN: 10.0000 mg | Freq: Once | INTRAVENOUS | Status: DC | PRN

## 2020-06-27 MED ORDER — LIDOCAINE 2% (20 MG/ML) 5 ML SYRINGE
INTRAMUSCULAR | Status: AC
  Filled 2020-06-27: qty 5

## 2020-06-27 MED ORDER — FENTANYL CITRATE (PF) 100 MCG/2ML IJ SOLN
INTRAMUSCULAR | Status: DC | PRN
  Administered 2020-06-27 (×4): 50 ug via INTRAVENOUS

## 2020-06-27 MED ORDER — FENTANYL CITRATE (PF) 100 MCG/2ML IJ SOLN
25.0000 ug | INTRAMUSCULAR | Status: DC | PRN
  Administered 2020-06-27 (×2): 50 ug via INTRAVENOUS

## 2020-06-27 MED ORDER — OXYCODONE HCL 5 MG/5ML PO SOLN: 5.0000 mg | Freq: Once | ORAL | Status: DC | PRN

## 2020-06-27 MED ORDER — GABAPENTIN 100 MG PO CAPS
ORAL_CAPSULE | ORAL | Status: AC
  Filled 2020-06-27: qty 2

## 2020-06-27 MED ORDER — EPHEDRINE SULFATE-NACL 50-0.9 MG/10ML-% IV SOSY
PREFILLED_SYRINGE | INTRAVENOUS | Status: DC | PRN
  Administered 2020-06-27 (×2): 10 mg via INTRAVENOUS

## 2020-06-27 MED ORDER — MORPHINE SULFATE (PF) 4 MG/ML IV SOLN: 2.0000 mg | INTRAVENOUS | Status: DC | PRN

## 2020-06-27 MED ORDER — LISINOPRIL 20 MG PO TABS: 20.0000 mg | ORAL_TABLET | Freq: Every day | ORAL | Status: DC

## 2020-06-27 MED ORDER — ZOLPIDEM TARTRATE 5 MG PO TABS: 5.0000 mg | ORAL_TABLET | Freq: Every evening | ORAL | Status: DC | PRN

## 2020-06-27 MED ORDER — PHENYLEPHRINE 40 MCG/ML (10ML) SYRINGE FOR IV PUSH (FOR BLOOD PRESSURE SUPPORT)
PREFILLED_SYRINGE | INTRAVENOUS | Status: DC | PRN
  Administered 2020-06-27: 80 ug via INTRAVENOUS
  Administered 2020-06-27: 120 ug via INTRAVENOUS
  Administered 2020-06-27: 80 ug via INTRAVENOUS
  Administered 2020-06-27: 120 ug via INTRAVENOUS

## 2020-06-27 MED ORDER — EPHEDRINE 5 MG/ML INJ
INTRAVENOUS | Status: AC
  Filled 2020-06-27: qty 10

## 2020-06-27 MED ORDER — SODIUM CHLORIDE 0.9 % IV SOLN: INTRAVENOUS | Status: DC

## 2020-06-27 MED ORDER — SENNOSIDES-DOCUSATE SODIUM 8.6-50 MG PO TABS: 2.0000 | ORAL_TABLET | Freq: Every day | ORAL | Status: DC

## 2020-06-27 MED ORDER — PROPOFOL 10 MG/ML IV BOLUS
INTRAVENOUS | Status: DC | PRN
  Administered 2020-06-27: 20 mg via INTRAVENOUS
  Administered 2020-06-27: 50 mg via INTRAVENOUS
  Administered 2020-06-27: 130 mg via INTRAVENOUS

## 2020-06-27 MED ORDER — CEFAZOLIN SODIUM-DEXTROSE 2-4 GM/100ML-% IV SOLN
INTRAVENOUS | Status: AC
  Filled 2020-06-27: qty 100

## 2020-06-27 MED ORDER — BACITRACIN-NEOMYCIN-POLYMYXIN 400-5-5000 EX OINT: 1.0000 "application " | TOPICAL_OINTMENT | Freq: Three times a day (TID) | CUTANEOUS | Status: DC | PRN

## 2020-06-27 MED ORDER — DEXAMETHASONE SODIUM PHOSPHATE 10 MG/ML IJ SOLN
INTRAMUSCULAR | Status: AC
  Filled 2020-06-27: qty 1

## 2020-06-27 MED ORDER — OXYCODONE HCL 5 MG PO TABS
ORAL_TABLET | ORAL | Status: AC
  Filled 2020-06-27: qty 1

## 2020-06-27 SURGICAL SUPPLY — 23 items
BAG DRAIN URO-CYSTO SKYTR STRL (DRAIN) ×2 IMPLANT
BAG DRN RND TRDRP ANRFLXCHMBR (UROLOGICAL SUPPLIES) ×1
BAG DRN UROCATH (DRAIN) ×1
BAG URINE DRAIN 2000ML AR STRL (UROLOGICAL SUPPLIES) ×2 IMPLANT
CATH FOLEY 2WAY SLVR  5CC 22FR (CATHETERS)
CATH FOLEY 2WAY SLVR 5CC 22FR (CATHETERS) IMPLANT
CATH HEMA 3WAY 30CC 22FR COUDE (CATHETERS) IMPLANT
CATH HEMA 3WAY 30CC 24FR RND (CATHETERS) ×2 IMPLANT
CLOTH BEACON ORANGE TIMEOUT ST (SAFETY) ×2 IMPLANT
COVER FOOTSWITCH UNIV (MISCELLANEOUS) ×2 IMPLANT
GLOVE SURG ENC MOIS LTX SZ7.5 (GLOVE) ×2 IMPLANT
GOWN STRL REUS W/TWL XL LVL3 (GOWN DISPOSABLE) ×2 IMPLANT
HOLDER FOLEY CATH W/STRAP (MISCELLANEOUS) ×2 IMPLANT
IV NS IRRIG 3000ML ARTHROMATIC (IV SOLUTION) ×18 IMPLANT
KIT TURNOVER CYSTO (KITS) ×2 IMPLANT
LOOP CUT BIPOLAR 24F LRG (ELECTROSURGICAL) ×2 IMPLANT
MANIFOLD NEPTUNE II (INSTRUMENTS) ×2 IMPLANT
PACK CYSTO (CUSTOM PROCEDURE TRAY) ×2 IMPLANT
SYR 30ML LL (SYRINGE) ×2 IMPLANT
SYR TOOMEY IRRIG 70ML (MISCELLANEOUS) ×2
SYRINGE TOOMEY IRRIG 70ML (MISCELLANEOUS) ×1 IMPLANT
TUBE CONNECTING 12X1/4 (SUCTIONS) ×2 IMPLANT
TUBING UROLOGY SET (TUBING) ×2 IMPLANT

## 2020-06-27 NOTE — Anesthesia Procedure Notes (Signed)
Procedure Name: LMA Insertion Date/Time: 06/27/2020 10:25 AM Performed by: Rogers Blocker, CRNA Pre-anesthesia Checklist: Patient identified, Emergency Drugs available, Suction available and Patient being monitored Patient Re-evaluated:Patient Re-evaluated prior to induction Oxygen Delivery Method: Circle System Utilized Preoxygenation: Pre-oxygenation with 100% oxygen Induction Type: IV induction Ventilation: Mask ventilation without difficulty LMA: LMA inserted LMA Size: 5.0 Number of attempts: 1 Airway Equipment and Method: Bite block Placement Confirmation: positive ETCO2 Tube secured with: Tape Dental Injury: Teeth and Oropharynx as per pre-operative assessment

## 2020-06-27 NOTE — Transfer of Care (Signed)
Immediate Anesthesia Transfer of Care Note  Patient: Carl Lam  Procedure(s) Performed: TRANSURETHRAL RESECTION OF THE PROSTATE (TURP) (N/A Prostate)  Patient Location: PACU  Anesthesia Type:General  Level of Consciousness: drowsy  Airway & Oxygen Therapy: Patient Spontanous Breathing and Patient connected to nasal cannula oxygen  Post-op Assessment: Report given to RN  Post vital signs: Reviewed and stable  Last Vitals:  Vitals Value Taken Time  BP 149/89   Temp    Pulse 92 06/27/20 1139  Resp 15 06/27/20 1139  SpO2 100 % 06/27/20 1139  Vitals shown include unvalidated device data.  Last Pain:  Vitals:   06/27/20 0852  TempSrc: Oral  PainSc: 0-No pain      Patients Stated Pain Goal: 4 (14/48/18 5631)  Complications: No complications documented.

## 2020-06-27 NOTE — Interval H&P Note (Signed)
History and Physical Interval Note:  06/27/2020 10:18 AM  Carl Lam  has presented today for surgery, with the diagnosis of BENIGN PROSTATE HYPERPLASIA.  The various methods of treatment have been discussed with the patient and family. After consideration of risks, benefits and other options for treatment, the patient has consented to  Procedure(s) with comments: [place] (TURP) (N/A) - REQUESTING 90 MINS FOR CASE as a surgical intervention.  The patient's history has been reviewed, patient examined, no change in status, stable for surgery.  I have reviewed the patient's chart and labs.  Questions were answered to the patient's satisfaction.     Marton Redwood, III

## 2020-06-27 NOTE — H&P (Signed)
CC: AUA Questions Scoring.  HPI: Carl Lam is a 75 year-old male established patient who is here AUA Questions.      CC/HPI: CC: Urinary retention, bilateral hydronephrosis  HPI:  04/29/2020  75 year old male underwent a CT of the abdomen and pelvis with contrast for iron deficiency anemia, lower GI bleed, and he was found to have bladder distention with bilateral hydroureteronephrosis. He has minimal voiding complaints but states that his stream is weaker than it used to be. He is not on any medication for his prostate. 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 He denies a family history of prostate cancer.   06/08/2020  Patient underwent urodynamics today. He had a very high capacity bladder. He had decreased sensation. First sensation was at 1340 cc. He generated a strong voluntary contraction with a voided volume of 425 cc. He voided with an obstructed flow pattern with a maximum flow of 10 cc/second. Postvoid residual was 1130. No reflux was noted. He underwent a renal ultrasound which showed some maybe mild fullness but no obvious hydronephrosis in bilateral kidneys. His bladder had been emptied prior to the renal ultrasound. He has not really been catheterizing as instructed.     AUA Symptom Score: 50% of the time he has the sensation of not emptying his bladder completely when finished urinating. Less than 20% of the time he has to urinate again fewer than two hours after he has finished urinating. Less than 20% of the time he has to start and stop again several times when he urinates. He never finds it difficult to postpone urination. Less than 50% of the time he has a weak urinary stream. He never has to push or strain to begin urination. He has to get up to urinate 1 time from the time he goes to bed until the time he gets up in the morning.   Calculated AUA Symptom Score: 8    ALLERGIES: None   MEDICATIONS: Lisinopril 20 mg tablet  Metformin  Hcl 500 mg tablet  Tamsulosin Hcl 0.4 mg capsule 1 capsule PO Daily  Amlodipine Besilate 1 PO Daily  Basaglar Kwikpen U-100  Gabapentin 100 mg capsule  Norvasc     GU PSH: None     PSH Notes: Hernia repair   NON-GU PSH: Hip Replacement, Right     GU PMH: BPH w/LUTS - 04/29/2020 Encounter for Prostate Cancer screening - 04/29/2020 Hydronephrosis - 04/29/2020 Urinary Retention - 04/29/2020    NON-GU PMH: Diabetes Type 2 Hypertension Stroke/TIA    FAMILY HISTORY: 3 daughters - Daughter   SOCIAL HISTORY: Marital Status: Married Current Smoking Status: Patient does not smoke anymore. Has not smoked since 04/16/1980.   Tobacco Use Assessment Completed: Used Tobacco in last 30 days? Does not drink anymore.  Drinks 2 caffeinated drinks per day.    REVIEW OF SYSTEMS:    GU Review Male:   Patient denies frequent urination, hard to postpone urination, burning/ pain with urination, get up at night to urinate, leakage of urine, stream starts and stops, trouble starting your stream, have to strain to urinate , erection problems, and penile pain.  Gastrointestinal (Upper):   Patient denies nausea, vomiting, and indigestion/ heartburn.  Gastrointestinal (Lower):   Patient denies diarrhea and constipation.  Constitutional:   Patient denies fever, night sweats, weight loss, and fatigue.  Skin:   Patient denies skin rash/ lesion and itching.  Eyes:   Patient denies blurred vision and double vision.  Ears/ Nose/ Throat:   Patient denies sore throat and sinus problems.  Hematologic/Lymphatic:   Patient denies swollen glands and easy bruising.  Cardiovascular:   Patient denies leg swelling and chest pains.  Respiratory:   Patient denies cough and shortness of breath.  Endocrine:   Patient denies excessive thirst.  Musculoskeletal:   Patient denies back pain and joint pain.  Neurological:   Patient denies headaches and dizziness.  Psychologic:   Patient denies depression and anxiety.   VITAL  SIGNS:      06/08/2020 03:44 PM  Weight 238 lb / 107.95 kg  Height 74 in / 187.96 cm  BP 131/76 mmHg  Pulse 118 /min  Temperature 98.0 F / 36.6 C  BMI 30.6 kg/m   Complexity of Data:  Source Of History:  Patient  Lab Test Review:   PSA  Records Review:   AUA Symptom Score, Previous Patient Records  Urodynamics Review:   Review Urodynamics Tests  X-Ray Review: Renal Ultrasound: Reviewed Films. Discussed With Patient.  C.T. Abdomen/Pelvis: Reviewed Films. Reviewed Report. Discussed With Patient.     04/29/20  PSA  Total PSA 3.03 ng/mL    PROCEDURES:         Flexible Cystoscopy - 52000  Risks, benefits, and some of the potential complications of the procedure were discussed at length with the patient including infection, bleeding, voiding discomfort, urinary retention, fever, chills, sepsis, and others. All questions were answered. Informed consent was obtained. Antibiotic prophylaxis was given. Sterile technique and intraurethral analgesia were used.  Meatus:  Normal size. Normal location. Normal condition.  Urethra:  No strictures.  External Sphincter:  Normal.  Verumontanum:  Normal.  Prostate:  Obstructing. Severe hyperplasia. The majority of the obstruction originated from the very large median lobe. The lateral lobes actually were a little bit short maybe 3 cm.  Bladder Neck:  Non-obstructing.  Ureteral Orifices:  Normal location. Normal size. Normal shape.   Bladder:  Mild trabeculation. No tumors. Normal mucosa. No stones.      The lower urinary tract was carefully examined. The procedure was well-tolerated and without complications. Antibiotic instructions were given. Instructions were given to call the office immediately for bloody urine, difficulty urinating, urinary retention, painful or frequent urination, fever, chills, nausea, vomiting or other illness. The patient stated that he understood these instructions and would comply with them.          Renal Ultrasound -  32951  Right Kidney: Length: 11.5 cm Depth:7.1 cm Cortical Width: .9 cm Width: 6.8 cm  Left Kidney: Length: 11.5 cm Depth:7.4 cm Cortical Width:1.0 cm Width: 6.2 cm  Left Kidney/Ureter:  There is mild calyceal dilatation  Right Kidney/Ureter:  There is mild calyceal dilatation otherwise wnl  Bladder:  PVR 52 ml There is a large probable median lobe indentation into bladder      . Patient confirmed No Neulasta OnPro Device.           Urodynamics - [id number], [id number], [id number], [id number], [id number] URODYNAMICS STUDY  Test Indication: Incomplete Emptying The procedure's risks, benefits and infection risk were discussed with the patient.  PRE UROFLOW & CATHETERIZATION Procedure: Pre Uroflow Study The patient did not void. No real urge to void.  A 14 FR straight catheter was inserted and 1450 mls was obtained. A Urodynamic catheter was inserted. He told me he has not been performing CISC regularly because he thought he was voiding more efficiently now.  At approx 300 mls, he said he felt "hot" and a  bit like he was going to faint. The infusion was stopped, and his chair was reclined. Initial B/P was 108/76 HR 102. Blood sugar was 119. He felt better fairly soon, and his chair was brought to an upright position. I restarted the infusion at a slower rate, but he quickly felt "hot" again. The chair was recline once again. His B/P was 79/52 HR 85. Subsequent B/Ps were as follows: 90/61 HR90, 102/69 HR 93, 101/68 HR 94, 105/70 HR 93, 125/79 HR 96. He began to feel better, so the infusion was restarted at a slow rate with him in a reclined position.   CYSTOMETRY/CYSTOMETROGRAM The bladder was filled with room temperature water at a rate of 50 cc less per minute. Injection of contrast was performed for the cystometrogram. Max capacity during the study was approx. First sensation occurred at 1340 mls. He was having an unstable contraction and went straight to a normal desire. Once the contraction settled down,  he was asked to go ahead and try to void as filling was continued.  The bladder was unstable with an unstable contraction of 6 cmH20 noted at 1340 mls, with a stronger contraction of 14 cmH20 right afterwards. He felt an urge to void, but was able to inhibit the contraction without leaking. Filling was continued to attempt a voluntary void.  LEAK POINT PRESSURE LPPs were assessed with pt in a seated position. X-ray was used to assess for SUI. No leakage was noted with abdominal pressures of 79 cmH20.  PRESSURE FLOW STUDY He was able to generate a voluntary contraction and void. He had to stand up to void.  Volume voided approx. 425 mls. Max flow was 10 ml/s. Detrusor pressure at peak flow was 73 cmH20. Max detrusor pressure was 84 cmH20. PVR was approx. 1130 mls.  ELECTROMYOGRAM Activity was measured by surface electrodes Mild increased EMG activity was noted during voiding.   FLUOROSCOPY and VCUG Trabeculation and some elevation of the bladder base was noted. A small right sided diverticulum was noted. No reflux was seen. Hip hardware was noted. Pt confirmed no Neulasta OnPro Device.   POST PROCEDURE ANTIBIOTICS: No antibiotics were given post UDS. Urine was sent for culture.   UDS SUMMARY Mr. Edgett arrived with 1450 mls in his bladder and no real urge to void. He told me he has not been performing CISC on a regular basis because he thought he was voiding more efficiently than before. During the study, he had a vasovagal event. Please see notes in the body of the report for details. His bladder was filled to a capacity of 1555 mls. His 1st sensation was expressed at 1340 mls. He was having an unstable contraction at the time and was able to inhibit the contraction without leaking. Filling was continued. He was able to generate a strong voluntary contraction and voided 425 mls with an obstructed flow pattern. Mild increased EMG activity was noted during voiding. PVR was approx. 1130  mls. Trabeculation and some elevation of the bladder base was noted. A small right sided diverticulum was noted. No reflux was seen. His bladder was drained prior to him leaving the UDS department. Final B/P was 117/79 HR 109. He will get a RUS and see the doctor following the study today.   ASSESSMENT:      ICD-10 Details  1 GU:   Urinary Retention - R33.8 Chronic, Stable  2   BPH w/LUTS - N40.1 Chronic, Stable  3   Hydronephrosis - N13.0 Chronic, Improving  PLAN:           Document Letter(s):  Created for Patient: Clinical Summary         Notes:   The patient has failed medical management for his lower urinary tract symptoms. He would like to proceed with surgical resection. I discussed bipolar transurethral resection of the prostate. I specifically discussed the risks including but not limited to bleeding which could require blood transfusion, infection, and injury to surrounding structures. Also discussed the possibility that the surgery would not improve symptoms though most men have a great improvement in their symptoms. Also discussed the low likelihood but possibility of development of new symptoms such as irritative voiding symptoms or urinary incontinence. Most men will have some degree of urinary urgency and discomfort immediately following the surgery that resolves in a short amount of time. He understands that most often this is an outpatient procedure but occasionally patients require hospitalization for continuous bladder irrigation in the event of excess bleeding. He also understands the possibility of being sent home with a urethral catheter. The patient expressed understanding and is eager to proceed.   He also understands that there is a chance that he may still need to catheterize but 1 of the goals of the procedure will be better bladder emptying to prevent Retention to the point of development ofhydronephrosis and subsequent kidney damage.   CC: Dr. Havery Moros    Signed  by Link Snuffer, III, M.D. on 06/08/20 at 4:15 PM (EST

## 2020-06-27 NOTE — Op Note (Signed)
Preoperative diagnosis: 1. Bladder outlet obstruction secondary to BPH  Postoperative diagnosis:  1. Bladder outlet obstruction secondary to BPH  Procedure:  1. Cystoscopy 2. Transurethral resection of the prostate  Surgeon: Marton Redwood, III. M.D.  Anesthesia: General  Complications: None  EBL: Minimal  Specimens: 1. Prostate chips  Indication: Carl Lam is a patient with bladder outlet obstruction secondary to benign prostatic hyperplasia. After reviewing the management options for treatment, he elected to proceed with the above surgical procedure(s). We have discussed the potential benefits and risks of the procedure, side effects of the proposed treatment, the likelihood of the patient achieving the goals of the procedure, and any potential problems that might occur during the procedure or recuperation. Informed consent has been obtained.  Description of procedure:  The patient was taken to the operating room and general anesthesia was induced.  The patient was placed in the dorsal lithotomy position, prepped and draped in the usual sterile fashion, and preoperative antibiotics were administered. A preoperative time-out was performed.   Cystourethroscopy was performed.  The patient's urethra was examined and showed a very large obstructing median lobe that was the most prominent portion of the prostate.  There was a short moderately obstructing lateral lobes..   The bladder was then systematically examined in its entirety. There was no evidence of any bladder tumors, stones, or other mucosal pathology.  The ureteral orifices were identified and marked so as to be avoided during the procedure.  The prostate adenoma was then resected utilizing loop cautery resection with the bipolar cutting loop.  The prostate adenoma from the bladder neck back to the verumontanum was resected beginning at the six o'clock position and then extended to include the right and left lobes of  the prostate and anterior prostate. Care was taken not to resect distal to the verumontanum.  Hemostasis was then achieved with the cautery and the bladder was emptied and reinspected with no significant bleeding noted at the end of the procedure.    A 24 French 3 way catheter was then placed into the bladder.  The patient appeared to tolerate the procedure well and without complications.  The patient was able to be awakened and transferred to the recovery unit in satisfactory condition.

## 2020-06-27 NOTE — Anesthesia Postprocedure Evaluation (Signed)
Anesthesia Post Note  Patient: ASHLAND WISEMAN  Procedure(s) Performed: TRANSURETHRAL RESECTION OF THE PROSTATE (TURP) (N/A Prostate)     Patient location during evaluation: PACU Anesthesia Type: General Level of consciousness: awake and alert Pain management: pain level controlled Vital Signs Assessment: post-procedure vital signs reviewed and stable Respiratory status: spontaneous breathing, nonlabored ventilation and respiratory function stable Cardiovascular status: blood pressure returned to baseline and stable Postop Assessment: no apparent nausea or vomiting Anesthetic complications: no   No complications documented.  Last Vitals:  Vitals:   06/27/20 1227 06/27/20 1330  BP: (!) 145/88 128/84  Pulse: 80 75  Resp: 16 16  Temp: 36.4 C (!) 36.4 C  SpO2: 100% 99%    Last Pain:  Vitals:   06/27/20 1227  TempSrc:   PainSc: 3                  Lidia Collum

## 2020-06-27 NOTE — Anesthesia Preprocedure Evaluation (Signed)
Anesthesia Evaluation  Patient identified by MRN, date of birth, ID band Patient awake    Reviewed: Allergy & Precautions, NPO status , Patient's Chart, lab work & pertinent test results  History of Anesthesia Complications Negative for: history of anesthetic complications  Airway Mallampati: II  TM Distance: >3 FB Neck ROM: Full    Dental  (+) Upper Dentures, Lower Dentures   Pulmonary neg pulmonary ROS, former smoker,    Pulmonary exam normal        Cardiovascular hypertension, Pt. 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  Reproductive/Obstetrics                             Anesthesia Physical Anesthesia Plan  ASA: III  Anesthesia Plan: General   Post-op Pain Management:    Induction: Intravenous  PONV Risk Score and Plan: 3 and Ondansetron, Dexamethasone, Midazolam and Treatment may vary due to age or medical condition  Airway Management Planned: LMA  Additional Equipment: None  Intra-op Plan:   Post-operative Plan: Extubation in OR  Informed Consent: I have reviewed the patients History and Physical, chart, labs and discussed the procedure including the risks, benefits and alternatives for the proposed anesthesia with the patient or authorized representative who has indicated his/her understanding and acceptance.     Dental advisory given  Plan Discussed with:   Anesthesia Plan Comments:         Anesthesia Quick  Evaluation

## 2020-06-27 NOTE — Discharge Instructions (Signed)

## 2020-06-28 ENCOUNTER — Encounter (HOSPITAL_BASED_OUTPATIENT_CLINIC_OR_DEPARTMENT_OTHER): Payer: Self-pay | Admitting: Urology

## 2020-06-28 DIAGNOSIS — N401 Enlarged prostate with lower urinary tract symptoms: Secondary | ICD-10-CM | POA: Diagnosis not present

## 2020-06-28 LAB — HEMOGLOBIN A1C
Hgb A1c MFr Bld: 7.9 % — ABNORMAL HIGH (ref 4.8–5.6)
Mean Plasma Glucose: 180.03 mg/dL

## 2020-06-28 LAB — SURGICAL PATHOLOGY

## 2020-06-28 LAB — GLUCOSE, CAPILLARY: Glucose-Capillary: 205 mg/dL — ABNORMAL HIGH (ref 70–99)

## 2020-06-28 MED ORDER — INSULIN ASPART 100 UNIT/ML ~~LOC~~ SOLN
SUBCUTANEOUS | Status: AC
  Filled 2020-06-28: qty 1

## 2020-06-28 NOTE — Discharge Summary (Signed)
Physician Discharge Summary  Patient ID: Carl Lam MRN: [id number] DOB/AGE: June 02, 1945 75 y.o.  Admit date: 06/27/2020 Discharge date: 06/28/2020  Admission Diagnoses:  Discharge Diagnoses:  Active Problems:   BPH (benign prostatic hyperplasia)   Discharged Condition: good  Hospital Course: Patient underwent a TURP on 06/27/2020.  He remained overnight for observation and was weaned off CBI.  He was stable for discharge with the Foley catheter the following day.  Consults: None  Significant Diagnostic Studies: None  Treatments: surgery: As above  Discharge Exam: Blood pressure 121/88, pulse 69, temperature 98.2 F (36.8 C), resp. rate 18, height 6\' 2"  (1.88 m), weight 104.9 kg, SpO2 97 %. General appearance: alert no acute distress Foley catheter light pink off CBI  Disposition: Discharge disposition: 01-Home or Self Care       Discharge Instructions    No wound care   Complete by: As directed      Allergies as of 06/28/2020   No Known Allergies     Medication List    STOP taking these medications   tamsulosin 0.4 MG Caps capsule Commonly known as: FLOMAX     TAKE these medications   amLODipine 5 MG tablet Commonly known as: NORVASC Take 1 tablet (5 mg total) by mouth daily.   atorvastatin 20 MG tablet Commonly known as: LIPITOR Take 1 tablet by mouth once daily   Basaglar KwikPen 100 UNIT/ML Inject 30 Units into the skin daily at 12 noon. What changed: additional instructions   diazepam 5 MG tablet Commonly known as: Valium Take 1-2 tablets 30 minutes prior to the exam.   EQ Lubricating Eye Drops 0.5-0.9 % ophthalmic solution Generic drug: carboxymethylcellul-glycerin Place 1 drop into both eyes daily.   ferrous sulfate 325 (65 FE) MG EC tablet Take 325 mg by mouth daily with breakfast.   gabapentin 100 MG capsule Commonly known as: NEURONTIN Take 2 capsules by mouth twice daily   HYDROcodone-acetaminophen 5-325 MG tablet Commonly  known as: NORCO/VICODIN Take 1 tablet by mouth every 4 (four) hours as needed for moderate pain.   lisinopril 20 MG tablet Commonly known as: ZESTRIL Take 1 tablet (20 mg total) by mouth daily.   metFORMIN 500 MG tablet Commonly known as: GLUCOPHAGE Take 2 tablets by mouth twice daily What changed:   how much to take  how to take this  when to take this  additional instructions        Signed: Marton Redwood, III 06/28/2020, 8:28 AM

## 2020-07-02 ENCOUNTER — Ambulatory Visit (HOSPITAL_COMMUNITY)
Admission: RE | Admit: 2020-07-02 | Discharge: 2020-07-02 | Disposition: A | Discharge status: Home / Self Care | Payer: Federal, State, Local not specified - PPO | Source: Ambulatory Visit | Attending: Gastroenterology | Admitting: Gastroenterology

## 2020-07-02 ENCOUNTER — Other Ambulatory Visit: Payer: Self-pay

## 2020-07-02 DIAGNOSIS — K869 Disease of pancreas, unspecified: Secondary | ICD-10-CM | POA: Insufficient documentation

## 2020-07-02 MED ORDER — GADOBUTROL 1 MMOL/ML IV SOLN
10.0000 mL | Freq: Once | INTRAVENOUS | Status: AC | PRN
  Administered 2020-07-02: 10 mL via INTRAVENOUS

## 2020-07-24 ENCOUNTER — Other Ambulatory Visit: Payer: Self-pay | Admitting: Family Medicine

## 2020-08-24 ENCOUNTER — Ambulatory Visit: Payer: Federal, State, Local not specified - PPO | Admitting: Family Medicine

## 2020-08-24 ENCOUNTER — Encounter: Payer: Self-pay | Admitting: Family Medicine

## 2020-08-24 ENCOUNTER — Other Ambulatory Visit: Payer: Self-pay

## 2020-08-24 VITALS — BP 142/88 | HR 58 | Temp 98.1°F | Wt 235.0 lb

## 2020-08-24 DIAGNOSIS — N400 Enlarged prostate without lower urinary tract symptoms: Secondary | ICD-10-CM

## 2020-08-24 DIAGNOSIS — E1165 Type 2 diabetes mellitus with hyperglycemia: Secondary | ICD-10-CM | POA: Diagnosis not present

## 2020-08-24 DIAGNOSIS — E785 Hyperlipidemia, unspecified: Secondary | ICD-10-CM | POA: Diagnosis not present

## 2020-08-24 DIAGNOSIS — Z794 Long term (current) use of insulin: Secondary | ICD-10-CM

## 2020-08-24 DIAGNOSIS — I10 Essential (primary) hypertension: Secondary | ICD-10-CM

## 2020-08-24 LAB — POCT GLYCOSYLATED HEMOGLOBIN (HGB A1C): Hemoglobin A1C: 6.5 % — AB (ref 4.0–5.6)

## 2020-08-24 MED ORDER — AMLODIPINE BESYLATE 10 MG PO TABS: 10.0000 mg | ORAL_TABLET | Freq: Every day | ORAL | 3 refills | Status: DC

## 2020-08-24 NOTE — Progress Notes (Signed)
Established Patient Office Visit  Subjective:  Patient ID: Carl Lam, male    DOB: [birth year]  Age: 75 y.o. MRN: [id number]  CC:  Chief Complaint  Patient presents with  . Diabetes  . Hypertension    HPI Carl Lam presents for medical follow-up.  He had recent TURP in March for BPH.  He is urinating without difficulty at this time.  He has type 2 diabetes.  His last A1c was 7.9%.  He is currently not exercising.  Denies any polyuria or polydipsia.  His weight is down 3 pounds from last visit.  He is on combination of Basaglar insulin 30 units daily and metformin.  Has prior history of stroke.  He had to come off aspirin because of severe diverticulosis bleed.  He has not had any recent bloody stools.  He has hypertension treated with amlodipine and lisinopril.  Not monitoring blood pressures at home.  No recent headaches or dizziness.  No chest pains.  He hopes to start exercising with walking soon  Past Medical History:  Diagnosis Date  . Acute lower GI bleeding 10/11/2011, 12-16-2019  . Anemia   . BPH (benign prostatic hyperplasia)   . Colon polyp   . CVA (cerebral vascular accident) ([place]) 12/14/2019   light stroke per pt gait is off a little per pt  . DM type 2 (diabetes mellitus, type 2) ([place]) 10/27/2008  . Dyspnea    sob with  heavy exertion  . History of blood transfusion 12/16/2019  . HYPERLIPIDEMIA 04/27/2009   Qualifier: Diagnosis of  By: Elease Hashimoto MD, Alazne Quant    . HYPERTENSION 10/27/2008   Qualifier: Diagnosis of  By: Valma Cava LPN, Izora Gala    . Intermittent self-catheterization of bladder    daily  . Wears dentures   . Wears glasses     Past Surgical History:  Procedure Laterality Date  . COLONOSCOPY  10/12/2011   Procedure: COLONOSCOPY;  Surgeon: Lafayette Dragon, MD;  Location[facility] ENDOSCOPY;  Service: Endoscopy;  Laterality: N/A;  . COLONOSCOPY WITH PROPOFOL N/A 12/16/2019   Procedure: COLONOSCOPY WITH PROPOFOL;  Surgeon: Ladene Artist, MD;  Location: [facility]  ENDOSCOPY;  Service: Endoscopy;  Laterality: N/A;  . ESOPHAGOGASTRODUODENOSCOPY  10/12/2011   Procedure: ESOPHAGOGASTRODUODENOSCOPY (EGD);  Surgeon: Lafayette Dragon, MD;  Location: [facility] ENDOSCOPY;  Service: Endoscopy;  Laterality: N/A;  . ESOPHAGOGASTRODUODENOSCOPY (EGD) WITH PROPOFOL N/A 12/16/2019   Procedure: ESOPHAGOGASTRODUODENOSCOPY (EGD) WITH PROPOFOL;  Surgeon: Ladene Artist, MD;  Location: [facility] ENDOSCOPY;  Service: Endoscopy;  Laterality: N/A;  . ESOPHAGOGASTRODUODENOSCOPY (EGD) WITH PROPOFOL N/A 05/12/2020   Procedure: ESOPHAGOGASTRODUODENOSCOPY (EGD) WITH PROPOFOL;  Surgeon: Milus Banister, MD;  Location: [facility] ENDOSCOPY;  Service: Endoscopy;  Laterality: N/A;  . EUS N/A 05/12/2020   Procedure: UPPER ENDOSCOPIC ULTRASOUND (EUS) RADIAL;  Surgeon: Milus Banister, MD;  Location: [facility] ENDOSCOPY;  Service: Endoscopy;  Laterality: N/A;  . HEMOSTASIS CLIP PLACEMENT  12/16/2019   Procedure: HEMOSTASIS CLIP PLACEMENT;  Surgeon: Ladene Artist, MD;  Location: [facility] ENDOSCOPY;  Service: Endoscopy;;  . HERNIA REPAIR  2007   "navel"  . JOINT REPLACEMENT    . KNEE CARTILAGE SURGERY  1960's and 1970's   left; "2 total"  . POLYPECTOMY  12/16/2019   Procedure: POLYPECTOMY;  Surgeon: Ladene Artist, MD;  Location: [facility] ENDOSCOPY;  Service: Endoscopy;;  . TOTAL HIP ARTHROPLASTY Right 2007  . TRANSURETHRAL RESECTION OF PROSTATE N/A 06/27/2020   Procedure: TRANSURETHRAL RESECTION OF THE PROSTATE (TURP);  Surgeon: Lucas Mallow,  MD;  Location: [place];  Service: Urology;  Laterality: N/A;  REQUESTING 32 MINS FOR CASE    Family History  Problem Relation Age of Onset  . Alcohol abuse Other        grandparent  . Arthritis Other        arthritis  . Diabetes Other        grandparent    Social History   Socioeconomic History  . Marital status: Married    Spouse name: Not on file  . Number of children: Not on file  . Years of education: Not on file  . Highest education level: Not on  file  Occupational History  . Not on file  Tobacco Use  . Smoking status: Former Smoker    Packs/day: 0.30    Years: 15.00    Pack years: 4.50    Types: Cigarettes    Quit date: 10/11/1978    Years since quitting: 41.8  . Smokeless tobacco: Never Used  Vaping Use  . Vaping Use: Never used  Substance and Sexual Activity  . Alcohol use: Not Currently    Comment: 10/11/11 "last alcohol 27-28 years ago"  . Drug use: Yes    Types: Marijuana    Comment: "recreational marijuana in [employer]; 1970's"  . Sexual activity: Not Currently  Other Topics Concern  . Not on file  Social History Narrative  . Not on file   Social Determinants of Health   Financial Resource Strain: Not on file  Food Insecurity: Not on file  Transportation Needs: Not on file  Physical Activity: Not on file  Stress: Not on file  Social Connections: Not on file  Intimate Partner Violence: Not on file    Outpatient Medications Prior to Visit  Medication Sig Dispense Refill  . atorvastatin (LIPITOR) 20 MG tablet Take 1 tablet by mouth once daily 90 tablet 1  . carboxymethylcellul-glycerin (EQ LUBRICATING EYE DROPS) 0.5-0.9 % ophthalmic solution Place 1 drop into both eyes daily.    . ferrous sulfate 325 (65 FE) MG EC tablet Take 325 mg by mouth daily with breakfast.    . gabapentin (NEURONTIN) 100 MG capsule Take 2 capsules by mouth twice daily (Patient taking differently: Take 200 mg by mouth 2 (two) times daily.) 360 capsule 0  . Insulin Glargine (BASAGLAR KWIKPEN) 100 UNIT/ML Inject 30 Units into the skin daily at 12 noon. (Patient taking differently: Inject 30 Units into the skin daily at 12 noon. Takes 1130 am daily) 9 mL 3  . lisinopril (ZESTRIL) 20 MG tablet Take 1 tablet (20 mg total) by mouth daily. 90 tablet 3  . metFORMIN (GLUCOPHAGE) 500 MG tablet Take 2 tablets by mouth twice daily 360 tablet 0  . amLODipine (NORVASC) 5 MG tablet Take 1 tablet (5 mg total) by mouth daily. 90 tablet 3  . diazepam  (VALIUM) 5 MG tablet Take 1-2 tablets 30 minutes prior to the exam. 2 tablet 0  . HYDROcodone-acetaminophen (NORCO/VICODIN) 5-325 MG tablet Take 1 tablet by mouth every 4 (four) hours as needed for moderate pain. 8 tablet 0   No facility-administered medications prior to visit.    No Known Allergies  ROS Review of Systems  Constitutional: Negative for fatigue and unexpected weight change.  Eyes: Negative for visual disturbance.  Respiratory: Negative for cough, chest tightness and shortness of breath.   Cardiovascular: Negative for chest pain, palpitations and leg swelling.  Endocrine: Negative for polydipsia and polyuria.  Neurological: Negative for dizziness, syncope, weakness,  light-headedness and headaches.      Objective:    Physical Exam Constitutional:      Appearance: He is well-developed.  Eyes:     Pupils: Pupils are equal, round, and reactive to light.  Neck:     Thyroid: No thyromegaly.  Cardiovascular:     Rate and Rhythm: Normal rate and regular rhythm.  Pulmonary:     Effort: Pulmonary effort is normal. No respiratory distress.     Breath sounds: Normal breath sounds. No wheezing or rales.  Musculoskeletal:     Cervical back: Neck supple.     Right lower leg: No edema.     Left lower leg: No edema.  Neurological:     Mental Status: He is alert and oriented to person, place, and time.     BP (!) 142/88 (BP Location: Left Arm, Cuff Size: Normal)   Pulse (!) 58   Temp 98.1 F (36.7 C) (Oral)   Wt 235 lb (106.6 kg)   SpO2 97%   BMI 30.17 kg/m  Wt Readings from Last 3 Encounters:  08/24/20 235 lb (106.6 kg)  06/27/20 231 lb 3.2 oz (104.9 kg)  06/23/20 240 lb (108.9 kg)     Health Maintenance Due  Topic Date Due  . PNA vac Low Risk Adult (1 of 2 - PCV13) Never done  . COVID-19 Vaccine (3 - Booster for Pfizer series) 01/29/2020    There are no preventive care reminders to display for this patient.  Lab Results  Component Value Date   TSH 2.66  07/15/2017   Lab Results  Component Value Date   WBC 5.0 06/23/2020   HGB 13.0 06/23/2020   HCT 39.7 06/23/2020   MCV 86.1 06/23/2020   PLT 263 06/23/2020   Lab Results  Component Value Date   NA 135 06/23/2020   K 5.0 06/23/2020   CO2 23 06/23/2020   GLUCOSE 245 (H) 06/23/2020   BUN 19 06/23/2020   CREATININE 1.46 (H) 06/23/2020   BILITOT 0.5 01/08/2020   ALKPHOS 38 01/08/2020   AST 11 (L) 01/08/2020   ALT 10 01/08/2020   PROT 5.9 (L) 01/08/2020   ALBUMIN 3.3 (L) 01/08/2020   CALCIUM 9.1 06/23/2020   ANIONGAP 7 06/23/2020   GFR 48.91 (L) 04/14/2020   Lab Results  Component Value Date   CHOL 107 12/15/2019   Lab Results  Component Value Date   HDL 36 (L) 12/15/2019   Lab Results  Component Value Date   LDLCALC 59 12/15/2019   Lab Results  Component Value Date   TRIG 61 12/15/2019   Lab Results  Component Value Date   CHOLHDL 3.0 12/15/2019   Lab Results  Component Value Date   HGBA1C 6.5 (A) 08/24/2020      Assessment & Plan:   #1 type 2 diabetes improved with A1c today 6.5%  -Continue Basaglar and metformin. -Continue yearly eye exam -Check feet with next visit  #2 hypertension suboptimally controlled.  Repeat reading left arm seated after rest 142/88.  Would like to see his blood pressure down below 135/80  -Increase amlodipine to 10 mg daily and continue lisinopril 20 mg daily -Try to get back to regular aerobic exercise such as walking -Keep sodium intake less than 2400 mg daily -Reassess blood pressure within 3 months  #3 hyperlipidemia.  Goal LDL less than 70  -Continue Lipitor and recheck lipids at follow-up in 3 months  #4 past history of CVA.  He had to come off aspirin because  of severe diverticulosis bleed.  Meds ordered this encounter  Medications  . amLODipine (NORVASC) 10 MG tablet    Sig: Take 1 tablet (10 mg total) by mouth daily.    Dispense:  90 tablet    Refill:  3    Follow-up: Return in about 3 months (around  11/24/2020).    Carolann Littler, MD

## 2020-08-24 NOTE — Patient Instructions (Signed)
Increase the Amlodipine to 10 mg daily  Let's plan on 3 month follow up.   A1C is 6.5% which is improved!

## 2020-09-21 ENCOUNTER — Other Ambulatory Visit: Payer: Self-pay | Admitting: Family Medicine

## 2020-10-04 ENCOUNTER — Ambulatory Visit: Payer: Federal, State, Local not specified - PPO | Admitting: Gastroenterology

## 2020-10-04 ENCOUNTER — Other Ambulatory Visit (INDEPENDENT_AMBULATORY_CARE_PROVIDER_SITE_OTHER): Payer: Federal, State, Local not specified - PPO

## 2020-10-04 ENCOUNTER — Encounter: Payer: Self-pay | Admitting: Gastroenterology

## 2020-10-04 VITALS — BP 130/88 | HR 82 | Ht 74.0 in | Wt 237.8 lb

## 2020-10-04 DIAGNOSIS — K862 Cyst of pancreas: Secondary | ICD-10-CM

## 2020-10-04 DIAGNOSIS — D509 Iron deficiency anemia, unspecified: Secondary | ICD-10-CM | POA: Diagnosis not present

## 2020-10-04 LAB — CBC WITH DIFFERENTIAL/PLATELET
Basophils Absolute: 0.1 10*3/uL (ref 0.0–0.1)
Basophils Relative: 1.1 % (ref 0.0–3.0)
Eosinophils Absolute: 0.1 10*3/uL (ref 0.0–0.7)
Eosinophils Relative: 2.1 % (ref 0.0–5.0)
HCT: 38.4 % — ABNORMAL LOW (ref 39.0–52.0)
Hemoglobin: 12.9 g/dL — ABNORMAL LOW (ref 13.0–17.0)
Lymphocytes Relative: 26.2 % (ref 12.0–46.0)
Lymphs Abs: 1.3 10*3/uL (ref 0.7–4.0)
MCHC: 33.7 g/dL (ref 30.0–36.0)
MCV: 90.7 fl (ref 78.0–100.0)
Monocytes Absolute: 0.4 10*3/uL (ref 0.1–1.0)
Monocytes Relative: 7.4 % (ref 3.0–12.0)
Neutro Abs: 3.2 10*3/uL (ref 1.4–7.7)
Neutrophils Relative %: 63.2 % (ref 43.0–77.0)
Platelets: 276 10*3/uL (ref 150.0–400.0)
RBC: 4.23 Mil/uL (ref 4.22–5.81)
RDW: 14.8 % (ref 11.5–15.5)
WBC: 5.1 10*3/uL (ref 4.0–10.5)

## 2020-10-04 LAB — IBC + FERRITIN
Ferritin: 14.4 ng/mL — ABNORMAL LOW (ref 22.0–322.0)
Iron: 54 ug/dL (ref 42–165)
Saturation Ratios: 13.9 % — ABNORMAL LOW (ref 20.0–50.0)
Transferrin: 278 mg/dL (ref 212.0–360.0)

## 2020-10-04 NOTE — Patient Instructions (Signed)
If you are age 74 or older, your body mass index should be between 23-30. Your Body mass index is 30.53 kg/m. If this is out of the aforementioned range listed, please consider follow up with your Primary Care Provider.  If you are age 63 or younger, your body mass index should be between 19-25. Your Body mass index is 30.53 kg/m. If this is out of the aformentioned range listed, please consider follow up with your Primary Care Provider.   __________________________________________________________  The [place] [facility] providers would like to encourage you to use [facility] to communicate with providers for non-urgent requests or questions.  Due to long hold times on the telephone, sending your provider a message by [facility] may be a faster and more efficient way to get a response.  Please allow 48 business hours for a response.  Please remember that this is for non-urgent requests.  __________________________________________________________  Continue iron  Please go to the lab in the basement of our building to have lab work done as you leave today. Hit "B" for basement when you get on the elevator.  When the doors open the lab is on your left.  We will call you with the results. Thank you.  You will be due for an MRCP in September.  We will remind you when it is time to schedule.  Thank you for entrusting me with your care and for choosing [facility], Dr. Hoopeston Cellar

## 2020-10-04 NOTE — Progress Notes (Signed)
HPI :  75 year old male here for follow-up for anemia and pancreatic cyst.  Recall that I initially saw him on December 15, 2019 when he was admitted to the hospital for change in gait/speech.  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  During his hospital course he developed significant change in his hemoglobin, 15 to 8s in the setting of dark stools with normal BUN.  Of note he had a similar presentation in 2013 when he had a GI bleed which led to EGD, colonoscopy, tagged RBC scan, thought to have a right-sided diverticular bleed at the time.  Neurology was planning antiplatelet therapy and he underwent upper and lower endoscopy as outlined below:   EGD 12/16/19 - small hiatal hernia, otherwise normal exam, no source of bleeding   Colonoscopy 12/16/19 - Mild diverticulosis in the right colon. There was no evidence of diverticular bleeding. - Moderate diverticulosis in the left colon. There was no evidence of diverticular bleeding. - One 8 mm polyp in the descending colon, removed with a cold snare. Resected and retrieved. - One 10 mm polyp in the rectum, removed with a cold snare. Resected and retrieved. Persistent oozing at polypectomy site. Clips (MR conditional) were placed. - Small internal hemorrhoid. - No blood noted in the colon prior to rectal polypectomy. - The examination was otherwise normal on direct and retroflexion views.   FINAL MICROSCOPIC DIAGNOSIS:   A. RECTUM AND DESCENDING COLON, POLYPECTOMY:  - Tubular adenoma(s) and tubulovillous adenoma(s)  - Negative for high-grade dysplasia or malignancy   He was thought to have a suspected diverticular bleed at that time. He was unfortunately readmitted on September 2021 with recurrent bleeding, this time dark red bloody bowel movements.  Hemoglobin had dropped from 10 to 8.1 at that time.  It was thought that he more than likely had a diverticular bleed that was recurrent versus less likely bleeding from polypectomy.  He  was observed overnight and discharged home in stable condition.   After his last visit with me we rechecked his blood counts and his hemoglobin remained low at 8.5 with an iron deficiency, ferritin of 6.  He was started on iron and underwent a capsule endoscopy.  Capsule endoscopy 03/03/20 - fair prep, incomplete study, capsule retained in small bowel. 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  In light of his recurrent iron deficiency anemia and inability to evaluate his small bowel with capsule he underwent a CT enterography as follows:  CT enterography 04/05/20 -  IMPRESSION: 1. No small bowel pathology to explain anemia. 2. Hypoattenuating pancreatic tail lesion. This could represent a septated cystic indolent neoplasm. However, adenocarcinoma cannot be excluded. Potential clinical strategies include more definitive characterization with pre and post contrast abdominal MRI/MRCP versus endoscopic ultrasound sampling (if feasible). 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 4. Left greater than right hydroureteronephrosis with suboptimal evaluation of the pelvis secondary to right hip arthroplasty. Favored to be related to bladder distension in the setting of prostatic enlargement. 5. Coronary artery atherosclerosis. Aortic Atherosclerosis (ICD10-I70.0).  He was referred to urology for his hydronephrosis and prostate enlargement.  He was subsequently referred to Dr. Ardis Hughs for an EUS as outlined:   EUS done with Dr. 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 The outer margins are well defined. There were several  large overlying blood vessels between the lesion and the scope tip rendering FNA too risky   MRCP 07/03/20 - IMPRESSION: 1. 2.4 x 2.1 cm cystic lesion in the tail region the pancreas  as detailed above. No significant change when compared to prior CT. Serous cystadenoma is possible. Tissue sampling via EUS versus close imaging follow-up depending on the clinical situation. 2. Persistent bilateral hydroureteronephrosis. 3. No abdominal adenopathy.   He is here for follow-up to discuss his course.  He states he completed a work-up with urology and had a TURP and he is urinating much better.  He has not had any blood in his stools.  He is taking iron once daily and occasionally has a dark stool but mostly seems normal.  He does have some occasional fatigue and decreased energy but overall feeling pretty well.  He is not taking any aspirin at this time or any blood thinners due to his history of recurrent anemia.  He does not really have any abdominal pains that bother him right now, his appetite is slightly lower than previous.  He denies any family history of pancreatic cancer.  He denies any personal history of pancreatitis.  His labs were last checked in March and he had normal hemoglobin of 13.0 with MCV of 72.     PREVIOUS ENDOSCOPIC EVALUATIONS / GI STUDIES :   June 2013 Colonoscopy for hematochezia --poor prep --blood throughout colon. Presumed right sided diverticular hemorrhage   June 2013 EGD for hematochezia --small hiatal hernia       Past Medical History:  Diagnosis Date   Acute lower GI bleeding 10/11/2011, 12-16-2019   Anemia    BPH (benign prostatic hyperplasia)    Colon polyp    CVA (cerebral vascular accident) ([place]) 12/14/2019   light stroke per pt gait is off a little per pt   DM type 2 (diabetes mellitus, type 2) ([place]) 10/27/2008   Dyspnea    sob with  heavy exertion   History of blood transfusion 12/16/2019   HYPERLIPIDEMIA 04/27/2009   Qualifier: Diagnosis of  By: Elease Hashimoto MD, Bruce     HYPERTENSION 10/27/2008   Qualifier: Diagnosis of  By: Valma Cava LPN, Nancy     Intermittent self-catheterization of bladder    daily   Wears dentures     Wears glasses      Past Surgical History:  Procedure Laterality Date   COLONOSCOPY  10/12/2011   Procedure: COLONOSCOPY;  Surgeon: Lafayette Dragon, MD;  Location: [facility] ENDOSCOPY;  Service: Endoscopy;  Laterality: N/A;   COLONOSCOPY WITH PROPOFOL N/A 12/16/2019   Procedure: COLONOSCOPY WITH PROPOFOL;  Surgeon: Ladene Artist, MD;  Location: [facility] ENDOSCOPY;  Service: Endoscopy;  Laterality: N/A;   ESOPHAGOGASTRODUODENOSCOPY  10/12/2011   Procedure: ESOPHAGOGASTRODUODENOSCOPY (EGD);  Surgeon: Lafayette Dragon, MD;  Location: [facility] ENDOSCOPY;  Service: Endoscopy;  Laterality: N/A;   ESOPHAGOGASTRODUODENOSCOPY (EGD) WITH PROPOFOL N/A 12/16/2019   Procedure: ESOPHAGOGASTRODUODENOSCOPY (EGD) WITH PROPOFOL;  Surgeon: Ladene Artist, MD;  Location: [facility] ENDOSCOPY;  Service: Endoscopy;  Laterality: N/A;   ESOPHAGOGASTRODUODENOSCOPY (EGD) WITH PROPOFOL N/A 05/12/2020   Procedure: ESOPHAGOGASTRODUODENOSCOPY (EGD) WITH PROPOFOL;  Surgeon: Milus Banister, MD;  Location: [facility] ENDOSCOPY;  Service: Endoscopy;  Laterality: N/A;   EUS N/A 05/12/2020   Procedure: UPPER ENDOSCOPIC ULTRASOUND (EUS) RADIAL;  Surgeon: Milus Banister, MD;  Location: [facility] ENDOSCOPY;  Service: Endoscopy;  Laterality: N/A;   HEMOSTASIS CLIP PLACEMENT  12/16/2019   Procedure: HEMOSTASIS CLIP PLACEMENT;  Surgeon: Ladene Artist, MD;  Location: [facility] ENDOSCOPY;  Service: Endoscopy;;   HERNIA REPAIR  2007   "navel"   JOINT REPLACEMENT     KNEE CARTILAGE SURGERY  1960's and 1970's   left; "2 total"   POLYPECTOMY  12/16/2019   Procedure: POLYPECTOMY;  Surgeon: Ladene Artist, MD;  Location: [place];  Service: Endoscopy;;   TOTAL HIP ARTHROPLASTY Right 2007   TRANSURETHRAL RESECTION OF PROSTATE N/A 06/27/2020   Procedure: TRANSURETHRAL RESECTION OF THE PROSTATE (TURP);  Surgeon: Lucas Mallow, MD;  Location: [facility];  Service: Urology;  Laterality: N/A;  REQUESTING 36 MINS FOR CASE   Family History  Problem Relation Age of Onset    Alcohol abuse Other        grandparent   Arthritis Other        arthritis   Diabetes Other        grandparent   Social History   Tobacco Use   Smoking status: Former    Packs/day: 0.30    Years: 15.00    Pack years: 4.50    Types: Cigarettes    Quit date: 10/11/1978    Years since quitting: 42.0   Smokeless tobacco: Never  Vaping Use   Vaping Use: Never used  Substance Use Topics   Alcohol use: Not Currently    Comment: 10/11/11 "last alcohol 27-28 years ago"   Drug use: Yes    Types: Marijuana    Comment: "recreational marijuana in [employer]; 1970's"   Current Outpatient Medications  Medication Sig Dispense Refill   amLODipine (NORVASC) 10 MG tablet Take 1 tablet (10 mg total) by mouth daily. 90 tablet 3   atorvastatin (LIPITOR) 20 MG tablet Take 1 tablet by mouth once daily 90 tablet 1   carboxymethylcellul-glycerin (EQ LUBRICATING EYE DROPS) 0.5-0.9 % ophthalmic solution Place 1 drop into both eyes daily.     ferrous sulfate 325 (65 FE) MG EC tablet Take 325 mg by mouth daily with breakfast.     gabapentin (NEURONTIN) 100 MG capsule Take 2 capsules by mouth twice daily 360 capsule 0   Insulin Glargine (BASAGLAR KWIKPEN) 100 UNIT/ML Inject 30 Units into the skin daily at 12 noon. (Patient taking differently: Inject 30 Units into the skin daily at 12 noon. Takes 1130 am daily) 9 mL 3   lisinopril (ZESTRIL) 20 MG tablet Take 1 tablet (20 mg total) by mouth daily. 90 tablet 3   metFORMIN (GLUCOPHAGE) 500 MG tablet Take 2 tablets by mouth twice daily 360 tablet 0   No current facility-administered medications for this visit.   No Known Allergies   Review of Systems: All systems reviewed and negative except where noted in HPI.    Physical Exam: BP 130/88   Pulse 82   Ht 6\' 2"  (1.88 m)   Wt 237 lb 12.8 oz (107.9 kg)   BMI 30.53 kg/m  Constitutional: Pleasant,well-developed, male in no acute distress. Abdominal: Soft, nondistended, diastasis recti, nontender. There  are no masses palpable.  Extremities: no edema Lymphadenopathy: No cervical adenopathy noted. Neurological: Alert and oriented to person place and time. Skin: Skin is warm and dry. No rashes noted. Psychiatric: Normal mood and affect. Behavior is normal.   ASSESSMENT AND PLAN: 75 year old male here for reassessment of the following  Iron deficiency anemia Pancreatic cyst  As above, the patient with recurrent iron deficiency anemia, he is also had some overt GI bleeding that has been attributed to diverticular bleeding in the past although small bowel source also possible.  CT enterography did not show any concerning mass lesions or pathology there however he did have some AVMs noted in the small bowel on capsule study.  Those could have certainly contributed to his iron deficiency anemia as well in the setting of antiplatelet therapy.  On iron his anemia had improved and has been doing pretty well since I've last seen him.  We will recheck his iron studies today and CBC to make sure stable and continue his iron supplementation.  If he is thought to be high risk for recurrence stroke and need antiplatelet therapy resumed, assuming his hemoglobin is stable at this time, we could do so and cautiously watch his blood counts but we need to keep a very close eye on that.  Otherwise, as above incidentally noted on CT enterography was a pancreatic lesion that was evaluated with EUS but not amenable to biopsy due to vasculature and risk for bleeding.  This was followed up by MRCP and this is hopefully a benign cystic lesion but radiology recommending close follow-up.  We will plan on another MRCP in 6 months from his last exam, due in September and see what that shows.  He may need Valium for sedation for his MRI which is what he needed previously.  All questions answered.   Jolly Mango, MD [facility]

## 2020-10-05 ENCOUNTER — Other Ambulatory Visit: Payer: Self-pay

## 2020-10-05 DIAGNOSIS — D509 Iron deficiency anemia, unspecified: Secondary | ICD-10-CM

## 2020-10-05 DIAGNOSIS — K862 Cyst of pancreas: Secondary | ICD-10-CM

## 2020-10-23 ENCOUNTER — Other Ambulatory Visit: Payer: Self-pay | Admitting: Family Medicine

## 2020-11-13 ENCOUNTER — Other Ambulatory Visit: Payer: Self-pay | Admitting: Family Medicine

## 2020-11-25 ENCOUNTER — Ambulatory Visit: Payer: Federal, State, Local not specified - PPO | Admitting: Family Medicine

## 2020-11-25 ENCOUNTER — Encounter: Payer: Self-pay | Admitting: Family Medicine

## 2020-11-25 ENCOUNTER — Other Ambulatory Visit: Payer: Self-pay

## 2020-11-25 VITALS — BP 138/86 | HR 85 | Temp 98.0°F | Wt 237.4 lb

## 2020-11-25 DIAGNOSIS — E1165 Type 2 diabetes mellitus with hyperglycemia: Secondary | ICD-10-CM | POA: Diagnosis not present

## 2020-11-25 DIAGNOSIS — E785 Hyperlipidemia, unspecified: Secondary | ICD-10-CM

## 2020-11-25 DIAGNOSIS — I10 Essential (primary) hypertension: Secondary | ICD-10-CM | POA: Diagnosis not present

## 2020-11-25 DIAGNOSIS — Z794 Long term (current) use of insulin: Secondary | ICD-10-CM

## 2020-11-25 LAB — BASIC METABOLIC PANEL
BUN: 20 mg/dL (ref 6–23)
CO2: 27 mEq/L (ref 19–32)
Calcium: 9.5 mg/dL (ref 8.4–10.5)
Chloride: 107 mEq/L (ref 96–112)
Creatinine, Ser: 1.39 mg/dL (ref 0.40–1.50)
GFR: 49.96 mL/min — ABNORMAL LOW (ref >60.00)
Glucose, Bld: 79 mg/dL (ref 70–99)
Potassium: 4.5 mEq/L (ref 3.5–5.1)
Sodium: 141 mEq/L (ref 135–145)

## 2020-11-25 LAB — LIPID PANEL
Cholesterol: 132 mg/dL (ref 0–200)
HDL: 43.7 mg/dL (ref >39.00)
LDL Cholesterol: 75 mg/dL (ref 0–99)
NonHDL: 88.6
Total CHOL/HDL Ratio: 3
Triglycerides: 66 mg/dL (ref 0.0–149.0)
VLDL: 13.2 mg/dL (ref 0.0–40.0)

## 2020-11-25 LAB — HEPATIC FUNCTION PANEL
ALT: 10 U/L (ref 0–53)
AST: 10 U/L (ref 0–37)
Albumin: 4.1 g/dL (ref 3.5–5.2)
Alkaline Phosphatase: 63 U/L (ref 39–117)
Bilirubin, Direct: 0.1 mg/dL (ref 0.0–0.3)
Total Bilirubin: 0.6 mg/dL (ref 0.2–1.2)
Total Protein: 7.2 g/dL (ref 6.0–8.3)

## 2020-11-25 LAB — POCT GLYCOSYLATED HEMOGLOBIN (HGB A1C): Hemoglobin A1C: 6.6 % — AB (ref 4.0–5.6)

## 2020-11-25 NOTE — Addendum Note (Signed)
Addended by: Amanda Cockayne on: 11/25/2020 08:58 AM   Modules accepted: Orders

## 2020-11-25 NOTE — Patient Instructions (Signed)
Try to lose some weight  Let's plan on 3 month follow up and if BP not improved at that time consider change in BP medication.

## 2020-11-25 NOTE — Progress Notes (Signed)
Established Patient Office Visit  Subjective:  Patient ID: Carl Lam, male    DOB: [birth year]  Age: 75 y.o. MRN: [id number]  CC:  Chief Complaint  Patient presents with   Follow-up    Diabetes     HPI Carl Lam presents for medical follow-up.  He has past history of CVA, hypertension, history of diverticulosis bleeds, type 2 diabetes, BPH, hyperlipidemia.  Generally feels well.  Last visit we increase his amlodipine to 10 mg daily.  No dizziness.  No headaches.  Not monitoring blood pressures regularly.  He has type 2 diabetes with good control by recent A1c's.  Remains on insulin and metformin.  No recent hypoglycemic episodes.  He does do some walking on the treadmill.  Occasional balance issues following his stroke.  No recent new neurologic symptoms.  Past Medical History:  Diagnosis Date   Acute lower GI bleeding 10/11/2011, 12-16-2019   Anemia    BPH (benign prostatic hyperplasia)    Colon polyp    CVA (cerebral vascular accident) ([place]) 12/14/2019   light stroke per pt gait is off a little per pt   DM type 2 (diabetes mellitus, type 2) ([place]) 10/27/2008   Dyspnea    sob with  heavy exertion   History of blood transfusion 12/16/2019   HYPERLIPIDEMIA 04/27/2009   Qualifier: Diagnosis of  By: Elease Hashimoto MD, Namita Yearwood     HYPERTENSION 10/27/2008   Qualifier: Diagnosis of  By: Valma Cava LPN, Nancy     Intermittent self-catheterization of bladder    daily   Wears dentures    Wears glasses     Past Surgical History:  Procedure Laterality Date   COLONOSCOPY  10/12/2011   Procedure: COLONOSCOPY;  Surgeon: Lafayette Dragon, MD;  Location: [facility] ENDOSCOPY;  Service: Endoscopy;  Laterality: N/A;   COLONOSCOPY WITH PROPOFOL N/A 12/16/2019   Procedure: COLONOSCOPY WITH PROPOFOL;  Surgeon: Ladene Artist, MD;  Location: [facility] ENDOSCOPY;  Service: Endoscopy;  Laterality: N/A;   ESOPHAGOGASTRODUODENOSCOPY  10/12/2011   Procedure: ESOPHAGOGASTRODUODENOSCOPY (EGD);  Surgeon: Lafayette Dragon, MD;  Location: [facility] ENDOSCOPY;  Service: Endoscopy;  Laterality: N/A;   ESOPHAGOGASTRODUODENOSCOPY (EGD) WITH PROPOFOL N/A 12/16/2019   Procedure: ESOPHAGOGASTRODUODENOSCOPY (EGD) WITH PROPOFOL;  Surgeon: Ladene Artist, MD;  Location: [facility] ENDOSCOPY;  Service: Endoscopy;  Laterality: N/A;   ESOPHAGOGASTRODUODENOSCOPY (EGD) WITH PROPOFOL N/A 05/12/2020   Procedure: ESOPHAGOGASTRODUODENOSCOPY (EGD) WITH PROPOFOL;  Surgeon: Milus Banister, MD;  Location: [facility] ENDOSCOPY;  Service: Endoscopy;  Laterality: N/A;   EUS N/A 05/12/2020   Procedure: UPPER ENDOSCOPIC ULTRASOUND (EUS) RADIAL;  Surgeon: Milus Banister, MD;  Location: [facility] ENDOSCOPY;  Service: Endoscopy;  Laterality: N/A;   HEMOSTASIS CLIP PLACEMENT  12/16/2019   Procedure: HEMOSTASIS CLIP PLACEMENT;  Surgeon: Ladene Artist, MD;  Location: [facility] ENDOSCOPY;  Service: Endoscopy;;   HERNIA REPAIR  2007   "navel"   JOINT REPLACEMENT     KNEE CARTILAGE SURGERY  1960's and 1970's   left; "2 total"   POLYPECTOMY  12/16/2019   Procedure: POLYPECTOMY;  Surgeon: Ladene Artist, MD;  Location: [place];  Service: Endoscopy;;   TOTAL HIP ARTHROPLASTY Right 2007   TRANSURETHRAL RESECTION OF PROSTATE N/A 06/27/2020   Procedure: TRANSURETHRAL RESECTION OF THE PROSTATE (TURP);  Surgeon: Lucas Mallow, MD;  Location: [facility];  Service: Urology;  Laterality: N/A;  REQUESTING 34 MINS FOR CASE    Family History  Problem Relation Age of Onset   Alcohol abuse Other  grandparent   Arthritis Other        arthritis   Diabetes Other        grandparent    Social History   Socioeconomic History   Marital status: Married    Spouse name: Not on file   Number of children: Not on file   Years of education: Not on file   Highest education level: Not on file  Occupational History   Not on file  Tobacco Use   Smoking status: Former    Packs/day: 0.30    Years: 15.00    Pack years: 4.50    Types: Cigarettes    Quit  date: 10/11/1978    Years since quitting: 42.1   Smokeless tobacco: Never  Vaping Use   Vaping Use: Never used  Substance and Sexual Activity   Alcohol use: Not Currently    Comment: 10/11/11 "last alcohol 27-28 years ago"   Drug use: Yes    Types: Marijuana    Comment: "recreational marijuana in [employer]; 1970's"   Sexual activity: Not Currently  Other Topics Concern   Not on file  Social History Narrative   Not on file   Social Determinants of Health   Financial Resource Strain: Not on file  Food Insecurity: Not on file  Transportation Needs: Not on file  Physical Activity: Not on file  Stress: Not on file  Social Connections: Not on file  Intimate Partner Violence: Not on file    Outpatient Medications Prior to Visit  Medication Sig Dispense Refill   amLODipine (NORVASC) 10 MG tablet Take 1 tablet (10 mg total) by mouth daily. 90 tablet 3   atorvastatin (LIPITOR) 20 MG tablet Take 1 tablet by mouth once daily 90 tablet 1   carboxymethylcellul-glycerin (EQ LUBRICATING EYE DROPS) 0.5-0.9 % ophthalmic solution Place 1 drop into both eyes daily.     ferrous sulfate 325 (65 FE) MG EC tablet Take 325 mg by mouth daily with breakfast.     gabapentin (NEURONTIN) 100 MG capsule Take 2 capsules by mouth twice daily 360 capsule 0   Insulin Glargine (BASAGLAR KWIKPEN) 100 UNIT/ML Inject 30 Units into the skin daily at 12 noon. (Patient taking differently: Inject 30 Units into the skin daily at 12 noon. Takes 1130 am daily) 9 mL 3   lisinopril (ZESTRIL) 20 MG tablet Take 1 tablet by mouth once daily 90 tablet 0   metFORMIN (GLUCOPHAGE) 500 MG tablet Take 2 tablets by mouth twice daily 360 tablet 0   No facility-administered medications prior to visit.    No Known Allergies  ROS Review of Systems  Constitutional:  Negative for fatigue.  Eyes:  Negative for visual disturbance.  Respiratory:  Negative for cough, chest tightness and shortness of breath.   Cardiovascular:  Negative  for chest pain, palpitations and leg swelling.  Endocrine: Negative for polydipsia and polyuria.  Genitourinary:  Negative for dysuria.  Neurological:  Negative for dizziness, syncope, weakness, light-headedness and headaches.     Objective:    Physical Exam Constitutional:      Appearance: He is well-developed.  HENT:     Right Ear: External ear normal.     Left Ear: External ear normal.  Eyes:     Pupils: Pupils are equal, round, and reactive to light.  Neck:     Thyroid: No thyromegaly.  Cardiovascular:     Rate and Rhythm: Normal rate and regular rhythm.  Pulmonary:     Effort: Pulmonary effort is normal. No respiratory  distress.     Breath sounds: Normal breath sounds. No wheezing or rales.  Musculoskeletal:     Cervical back: Neck supple.     Right lower leg: No edema.     Left lower leg: No edema.  Neurological:     Mental Status: He is alert and oriented to person, place, and time.    BP 138/86 (BP Location: Left Arm, Cuff Size: Normal)   Pulse 85   Temp 98 F (36.7 C) (Oral)   Wt 237 lb 6.4 oz (107.7 kg)   SpO2 99%   BMI 30.48 kg/m  Wt Readings from Last 3 Encounters:  11/25/20 237 lb 6.4 oz (107.7 kg)  10/04/20 237 lb 12.8 oz (107.9 kg)  08/24/20 235 lb (106.6 kg)     Health Maintenance Due  Topic Date Due   Zoster Vaccines- Shingrix (1 of 2) Never done   PNA vac Low Risk Adult (1 of 2 - PCV13) Never done   COVID-19 Vaccine (3 - Booster for Pfizer series) 12/30/2019   FOOT EXAM  11/19/2020    There are no preventive care reminders to display for this patient.  Lab Results  Component Value Date   TSH 2.66 07/15/2017   Lab Results  Component Value Date   WBC 5.1 10/04/2020   HGB 12.9 (L) 10/04/2020   HCT 38.4 (L) 10/04/2020   MCV 90.7 10/04/2020   PLT 276.0 10/04/2020   Lab Results  Component Value Date   NA 135 06/23/2020   K 5.0 06/23/2020   CO2 23 06/23/2020   GLUCOSE 245 (H) 06/23/2020   BUN 19 06/23/2020   CREATININE 1.46 (H)  06/23/2020   BILITOT 0.5 01/08/2020   ALKPHOS 38 01/08/2020   AST 11 (L) 01/08/2020   ALT 10 01/08/2020   PROT 5.9 (L) 01/08/2020   ALBUMIN 3.3 (L) 01/08/2020   CALCIUM 9.1 06/23/2020   ANIONGAP 7 06/23/2020   GFR 48.91 (L) 04/14/2020   Lab Results  Component Value Date   CHOL 107 12/15/2019   Lab Results  Component Value Date   HDL 36 (L) 12/15/2019   Lab Results  Component Value Date   LDLCALC 59 12/15/2019   Lab Results  Component Value Date   TRIG 61 12/15/2019   Lab Results  Component Value Date   CHOLHDL 3.0 12/15/2019   Lab Results  Component Value Date   HGBA1C 6.6 (A) 11/25/2020      Assessment & Plan:   #1 type 2 diabetes stable with A1c 6.6% -Continue current diabetic medications and reassess in 3 months  #2 dyslipidemia.  Goal LDL less than 70.  Patient on atorvastatin. -Recheck lipid and hepatic panel today  #3 hypertension.  Borderline elevated repeat reading with 138/86.  Like to see closer to 135/80.  We recommended 36-monthtrial of additional weight loss.  Keep sodium intake less than 2500 mg daily.  If blood pressure not closer to goal at follow-up consider changing lisinopril to lisinopril HCTZ.   No orders of the defined types were placed in this encounter.   Follow-up: Return in about 3 months (around 02/25/2021).    BCarolann Littler MD

## 2020-12-05 ENCOUNTER — Telehealth: Payer: Self-pay

## 2020-12-05 ENCOUNTER — Other Ambulatory Visit (INDEPENDENT_AMBULATORY_CARE_PROVIDER_SITE_OTHER): Payer: Federal, State, Local not specified - PPO

## 2020-12-05 DIAGNOSIS — D509 Iron deficiency anemia, unspecified: Secondary | ICD-10-CM | POA: Diagnosis not present

## 2020-12-05 DIAGNOSIS — K862 Cyst of pancreas: Secondary | ICD-10-CM

## 2020-12-05 LAB — CBC WITH DIFFERENTIAL/PLATELET
Basophils Absolute: 0 10*3/uL (ref 0.0–0.1)
Basophils Relative: 0.8 % (ref 0.0–3.0)
Eosinophils Absolute: 0.1 10*3/uL (ref 0.0–0.7)
Eosinophils Relative: 2.7 % (ref 0.0–5.0)
HCT: 38.9 % — ABNORMAL LOW (ref 39.0–52.0)
Hemoglobin: 13.1 g/dL (ref 13.0–17.0)
Lymphocytes Relative: 31 % (ref 12.0–46.0)
Lymphs Abs: 1.7 10*3/uL (ref 0.7–4.0)
MCHC: 33.6 g/dL (ref 30.0–36.0)
MCV: 89.8 fl (ref 78.0–100.0)
Monocytes Absolute: 0.4 10*3/uL (ref 0.1–1.0)
Monocytes Relative: 7.6 % (ref 3.0–12.0)
Neutro Abs: 3.2 10*3/uL (ref 1.4–7.7)
Neutrophils Relative %: 57.9 % (ref 43.0–77.0)
Platelets: 267 10*3/uL (ref 150.0–400.0)
RBC: 4.34 Mil/uL (ref 4.22–5.81)
RDW: 14.3 % (ref 11.5–15.5)
WBC: 5.5 10*3/uL (ref 4.0–10.5)

## 2020-12-05 LAB — IBC + FERRITIN
Ferritin: 25 ng/mL (ref 22.0–322.0)
Iron: 66 ug/dL (ref 42–165)
Saturation Ratios: 20 % (ref 20.0–50.0)
TIBC: 330.4 ug/dL (ref 250.0–450.0)
Transferrin: 236 mg/dL (ref 212.0–360.0)

## 2020-12-05 NOTE — Telephone Encounter (Signed)
Spoke with patient to remind him that he is due for repeat labs at this time. No appointment is necessary. Patient is aware that he can stop by the lab in the basement at his convenience between 7:30 AM - 5 PM, Monday through Friday. Patient verbalized understanding and had no concerns at the end of the call.   

## 2020-12-05 NOTE — Telephone Encounter (Signed)
-----   Message from Yevette Edwards, RN sent at 10/05/2020  9:53 AM EDT ----- Regarding: Labs Repeat CBC, IBC + Ferritin. Order in epic.

## 2020-12-06 ENCOUNTER — Other Ambulatory Visit: Payer: Self-pay

## 2020-12-06 DIAGNOSIS — D509 Iron deficiency anemia, unspecified: Secondary | ICD-10-CM

## 2020-12-24 ENCOUNTER — Other Ambulatory Visit: Payer: Self-pay | Admitting: Family Medicine

## 2021-01-08 ENCOUNTER — Other Ambulatory Visit: Payer: Self-pay | Admitting: Family Medicine

## 2021-01-20 ENCOUNTER — Other Ambulatory Visit: Payer: Self-pay | Admitting: Family Medicine

## 2021-02-10 ENCOUNTER — Other Ambulatory Visit: Payer: Self-pay | Admitting: Family Medicine

## 2021-02-27 ENCOUNTER — Ambulatory Visit: Payer: Federal, State, Local not specified - PPO | Admitting: Family Medicine

## 2021-02-27 VITALS — BP 152/96 | HR 82 | Temp 97.8°F | Wt 243.1 lb

## 2021-02-27 DIAGNOSIS — E1165 Type 2 diabetes mellitus with hyperglycemia: Secondary | ICD-10-CM | POA: Diagnosis not present

## 2021-02-27 DIAGNOSIS — Z794 Long term (current) use of insulin: Secondary | ICD-10-CM

## 2021-02-27 DIAGNOSIS — I10 Essential (primary) hypertension: Secondary | ICD-10-CM | POA: Diagnosis not present

## 2021-02-27 LAB — POCT GLYCOSYLATED HEMOGLOBIN (HGB A1C): Hemoglobin A1C: 6.5 % — AB (ref 4.0–5.6)

## 2021-02-27 MED ORDER — CHLORTHALIDONE 25 MG PO TABS: 25.0000 mg | ORAL_TABLET | Freq: Every day | ORAL | 3 refills | Status: DC

## 2021-02-27 NOTE — Progress Notes (Signed)
Established Patient Office Visit  Subjective:  Patient ID: VADHIR MCNAY, male    DOB: [birth year]  Age: 75 y.o. MRN: [id number]  CC:  Chief Complaint  Patient presents with   Follow-up    diabetes    HPI Carl Lam presents for medical follow-up.  He has type 2 diabetes, history of diverticulosis bleeds, hypertension, hyperlipidemia.  No recent bloody stools.  Compliant with medications.  He remains on lisinopril and amlodipine for hypertension.  Not monitoring blood pressures recently at home.  Denies any recent headaches or dizziness.  Diabetes been well controlled with most recent A1c 6.6%.  No recent hypoglycemic symptoms.  Hyperlipidemia treated with Lipitor.  Tolerating well.  Lipids were checked in August and stable.  Has not been exercising much recently plans to start walking more soon.  Past Medical History:  Diagnosis Date   Acute lower GI bleeding 10/11/2011, 12-16-2019   Anemia    BPH (benign prostatic hyperplasia)    Colon polyp    CVA (cerebral vascular accident) ([place]) 12/14/2019   light stroke per pt gait is off a little per pt   DM type 2 (diabetes mellitus, type 2) ([place]) 10/27/2008   Dyspnea    sob with  heavy exertion   History of blood transfusion 12/16/2019   HYPERLIPIDEMIA 04/27/2009   Qualifier: Diagnosis of  By: Elease Hashimoto MD, Lander Eslick     HYPERTENSION 10/27/2008   Qualifier: Diagnosis of  By: Valma Cava LPN, Nancy     Intermittent self-catheterization of bladder    daily   Wears dentures    Wears glasses     Past Surgical History:  Procedure Laterality Date   COLONOSCOPY  10/12/2011   Procedure: COLONOSCOPY;  Surgeon: Lafayette Dragon, MD;  Location: [facility] ENDOSCOPY;  Service: Endoscopy;  Laterality: N/A;   COLONOSCOPY WITH PROPOFOL N/A 12/16/2019   Procedure: COLONOSCOPY WITH PROPOFOL;  Surgeon: Ladene Artist, MD;  Location: [facility] ENDOSCOPY;  Service: Endoscopy;  Laterality: N/A;   ESOPHAGOGASTRODUODENOSCOPY  10/12/2011   Procedure:  ESOPHAGOGASTRODUODENOSCOPY (EGD);  Surgeon: Lafayette Dragon, MD;  Location: [facility] ENDOSCOPY;  Service: Endoscopy;  Laterality: N/A;   ESOPHAGOGASTRODUODENOSCOPY (EGD) WITH PROPOFOL N/A 12/16/2019   Procedure: ESOPHAGOGASTRODUODENOSCOPY (EGD) WITH PROPOFOL;  Surgeon: Ladene Artist, MD;  Location: [facility] ENDOSCOPY;  Service: Endoscopy;  Laterality: N/A;   ESOPHAGOGASTRODUODENOSCOPY (EGD) WITH PROPOFOL N/A 05/12/2020   Procedure: ESOPHAGOGASTRODUODENOSCOPY (EGD) WITH PROPOFOL;  Surgeon: Milus Banister, MD;  Location: [facility] ENDOSCOPY;  Service: Endoscopy;  Laterality: N/A;   EUS N/A 05/12/2020   Procedure: UPPER ENDOSCOPIC ULTRASOUND (EUS) RADIAL;  Surgeon: Milus Banister, MD;  Location: [facility] ENDOSCOPY;  Service: Endoscopy;  Laterality: N/A;   HEMOSTASIS CLIP PLACEMENT  12/16/2019   Procedure: HEMOSTASIS CLIP PLACEMENT;  Surgeon: Ladene Artist, MD;  Location: [facility] ENDOSCOPY;  Service: Endoscopy;;   HERNIA REPAIR  2007   "navel"   JOINT REPLACEMENT     KNEE CARTILAGE SURGERY  1960's and 1970's   left; "2 total"   POLYPECTOMY  12/16/2019   Procedure: POLYPECTOMY;  Surgeon: Ladene Artist, MD;  Location: [place];  Service: Endoscopy;;   TOTAL HIP ARTHROPLASTY Right 2007   TRANSURETHRAL RESECTION OF PROSTATE N/A 06/27/2020   Procedure: TRANSURETHRAL RESECTION OF THE PROSTATE (TURP);  Surgeon: Lucas Mallow, MD;  Location: [facility];  Service: Urology;  Laterality: N/A;  REQUESTING 31 MINS FOR CASE    Family History  Problem Relation Age of Onset   Alcohol abuse Other  grandparent   Arthritis Other        arthritis   Diabetes Other        grandparent    Social History   Socioeconomic History   Marital status: Married    Spouse name: Not on file   Number of children: Not on file   Years of education: Not on file   Highest education level: Not on file  Occupational History   Not on file  Tobacco Use   Smoking status: Former    Packs/day: 0.30    Years: 15.00     Pack years: 4.50    Types: Cigarettes    Quit date: 10/11/1978    Years since quitting: 42.4   Smokeless tobacco: Never  Vaping Use   Vaping Use: Never used  Substance and Sexual Activity   Alcohol use: Not Currently    Comment: 10/11/11 "last alcohol 27-28 years ago"   Drug use: Yes    Types: Marijuana    Comment: "recreational marijuana in [employer]; 1970's"   Sexual activity: Not Currently  Other Topics Concern   Not on file  Social History Narrative   Not on file   Social Determinants of Health   Financial Resource Strain: Not on file  Food Insecurity: Not on file  Transportation Needs: Not on file  Physical Activity: Not on file  Stress: Not on file  Social Connections: Not on file  Intimate Partner Violence: Not on file    Outpatient Medications Prior to Visit  Medication Sig Dispense Refill   amLODipine (NORVASC) 10 MG tablet Take 1 tablet (10 mg total) by mouth daily. 90 tablet 3   atorvastatin (LIPITOR) 20 MG tablet Take 1 tablet by mouth once daily 90 tablet 0   carboxymethylcellul-glycerin (EQ LUBRICATING EYE DROPS) 0.5-0.9 % ophthalmic solution Place 1 drop into both eyes daily.     ferrous sulfate 325 (65 FE) MG EC tablet Take 325 mg by mouth daily with breakfast.     gabapentin (NEURONTIN) 100 MG capsule Take 2 capsules by mouth twice daily 360 capsule 0   Insulin Glargine (BASAGLAR KWIKPEN) 100 UNIT/ML INJECT 30 UNITS INTO THE SKIN DAILY AT 12 PM (NOON). 15 mL 0   lisinopril (ZESTRIL) 20 MG tablet Take 1 tablet by mouth once daily 90 tablet 0   metFORMIN (GLUCOPHAGE) 500 MG tablet Take 2 tablets by mouth twice daily 360 tablet 0   No facility-administered medications prior to visit.    No Known Allergies  ROS Review of Systems  Constitutional:  Negative for fatigue.  Eyes:  Negative for visual disturbance.  Respiratory:  Negative for cough, chest tightness and shortness of breath.   Cardiovascular:  Negative for chest pain, palpitations and leg swelling.   Endocrine: Negative for polydipsia and polyuria.  Neurological:  Negative for dizziness, syncope, weakness, light-headedness and headaches.     Objective:    Physical Exam Constitutional:      Appearance: He is well-developed.  HENT:     Right Ear: External ear normal.     Left Ear: External ear normal.  Eyes:     Pupils: Pupils are equal, round, and reactive to light.  Neck:     Thyroid: No thyromegaly.  Cardiovascular:     Rate and Rhythm: Normal rate and regular rhythm.  Pulmonary:     Effort: Pulmonary effort is normal. No respiratory distress.     Breath sounds: Normal breath sounds. No wheezing or rales.  Musculoskeletal:     Cervical back:  Neck supple.     Right lower leg: No edema.     Left lower leg: No edema.  Neurological:     Mental Status: He is alert and oriented to person, place, and time.    BP (!) 180/100 (BP Location: Left Arm, Patient Position: Sitting, Cuff Size: Normal)   Pulse 82   Temp 97.8 F (36.6 C) (Oral)   Wt 243 lb 1.6 oz (110.3 kg)   SpO2 98%   BMI 31.21 kg/m  Wt Readings from Last 3 Encounters:  02/27/21 243 lb 1.6 oz (110.3 kg)  11/25/20 237 lb 6.4 oz (107.7 kg)  10/04/20 237 lb 12.8 oz (107.9 kg)     Health Maintenance Due  Topic Date Due   Pneumonia Vaccine 96+ Years old (1 - PCV) Never done   Zoster Vaccines- Shingrix (1 of 2) Never done   COVID-19 Vaccine (3 - Booster for Pfizer series) 09/24/2019   FOOT EXAM  11/19/2020    There are no preventive care reminders to display for this patient.  Lab Results  Component Value Date   TSH 2.66 07/15/2017   Lab Results  Component Value Date   WBC 5.5 12/05/2020   HGB 13.1 12/05/2020   HCT 38.9 (L) 12/05/2020   MCV 89.8 12/05/2020   PLT 267.0 12/05/2020   Lab Results  Component Value Date   NA 141 11/25/2020   K 4.5 11/25/2020   CO2 27 11/25/2020   GLUCOSE 79 11/25/2020   BUN 20 11/25/2020   CREATININE 1.39 11/25/2020   BILITOT 0.6 11/25/2020   ALKPHOS 63  11/25/2020   AST 10 11/25/2020   ALT 10 11/25/2020   PROT 7.2 11/25/2020   ALBUMIN 4.1 11/25/2020   CALCIUM 9.5 11/25/2020   ANIONGAP 7 06/23/2020   GFR 49.96 (L) 11/25/2020   Lab Results  Component Value Date   CHOL 132 11/25/2020   Lab Results  Component Value Date   HDL 43.70 11/25/2020   Lab Results  Component Value Date   LDLCALC 75 11/25/2020   Lab Results  Component Value Date   TRIG 66.0 11/25/2020   Lab Results  Component Value Date   CHOLHDL 3 11/25/2020   Lab Results  Component Value Date   HGBA1C 6.5 (A) 02/27/2021      Assessment & Plan:   #1 hypertension poorly controlled.  Repeat right arm seated after rest 152/96.  Continue lisinopril and amlodipine and add chlorthalidone 25 mg once daily.  Reviewed potential side effects.  Reassess 1 month and check basic metabolic panel then.  Try to keep sodium intake less than 2500 mg daily.  Handout on DASH diet given  #2 type 2 diabetes stable with A1c today 6.5%.  Continue current diabetic medication regimen   Meds ordered this encounter  Medications   chlorthalidone (HYGROTON) 25 MG tablet    Sig: Take 1 tablet (25 mg total) by mouth daily.    Dispense:  90 tablet    Refill:  3    Follow-up: Return in about 1 month (around 03/29/2021).    Carolann Littler, MD

## 2021-03-02 ENCOUNTER — Other Ambulatory Visit: Payer: Self-pay | Admitting: Family Medicine

## 2021-03-08 ENCOUNTER — Telehealth: Payer: Self-pay

## 2021-03-08 NOTE — Telephone Encounter (Signed)
-----   Message from Yevette Edwards, RN sent at 12/06/2020  2:16 PM EDT ----- Regarding: Labs Repeat CBC, order in epic.

## 2021-03-08 NOTE — Telephone Encounter (Signed)
Left detailed vm to remind him that he is due for repeat labs at this time. Advised that no appointment is necessary. Advised patient that he can stop by the lab in the basement at his convenience between 7:30 AM - 5 PM, Monday through Friday next week since we are closed the rest of this week for the holiday. Advised patient to return call if he has any questions or concerns.

## 2021-03-13 ENCOUNTER — Other Ambulatory Visit (INDEPENDENT_AMBULATORY_CARE_PROVIDER_SITE_OTHER): Payer: Federal, State, Local not specified - PPO

## 2021-03-13 DIAGNOSIS — D509 Iron deficiency anemia, unspecified: Secondary | ICD-10-CM | POA: Diagnosis not present

## 2021-03-13 LAB — CBC WITH DIFFERENTIAL/PLATELET
Basophils Absolute: 0.1 10*3/uL (ref 0.0–0.1)
Basophils Relative: 1.5 % (ref 0.0–3.0)
Eosinophils Absolute: 0.1 10*3/uL (ref 0.0–0.7)
Eosinophils Relative: 1.1 % (ref 0.0–5.0)
HCT: 42.6 % (ref 39.0–52.0)
Hemoglobin: 13.9 g/dL (ref 13.0–17.0)
Lymphocytes Relative: 28 % (ref 12.0–46.0)
Lymphs Abs: 1.7 10*3/uL (ref 0.7–4.0)
MCHC: 32.6 g/dL (ref 30.0–36.0)
MCV: 91.4 fl (ref 78.0–100.0)
Monocytes Absolute: 0.4 10*3/uL (ref 0.1–1.0)
Monocytes Relative: 6.9 % (ref 3.0–12.0)
Neutro Abs: 3.9 10*3/uL (ref 1.4–7.7)
Neutrophils Relative %: 62.5 % (ref 43.0–77.0)
Platelets: 250 10*3/uL (ref 150.0–400.0)
RBC: 4.66 Mil/uL (ref 4.22–5.81)
RDW: 14.7 % (ref 11.5–15.5)
WBC: 6.3 10*3/uL (ref 4.0–10.5)

## 2021-03-28 ENCOUNTER — Other Ambulatory Visit: Payer: Self-pay | Admitting: Family Medicine

## 2021-03-29 ENCOUNTER — Ambulatory Visit: Payer: Federal, State, Local not specified - PPO | Admitting: Family Medicine

## 2021-03-29 VITALS — BP 132/84 | HR 82 | Temp 97.6°F | Wt 235.6 lb

## 2021-03-29 DIAGNOSIS — I10 Essential (primary) hypertension: Secondary | ICD-10-CM | POA: Diagnosis not present

## 2021-03-29 LAB — BASIC METABOLIC PANEL
BUN: 29 mg/dL — ABNORMAL HIGH (ref 6–23)
CO2: 28 mEq/L (ref 19–32)
Calcium: 10 mg/dL (ref 8.4–10.5)
Chloride: 106 mEq/L (ref 96–112)
Creatinine, Ser: 1.69 mg/dL — ABNORMAL HIGH (ref 0.40–1.50)
GFR: 39.42 mL/min — ABNORMAL LOW (ref >60.00)
Glucose, Bld: 108 mg/dL — ABNORMAL HIGH (ref 70–99)
Potassium: 4.2 mEq/L (ref 3.5–5.1)
Sodium: 141 mEq/L (ref 135–145)

## 2021-03-29 NOTE — Progress Notes (Signed)
Established Patient Office Visit  Subjective:  Patient ID: Carl Lam, male    DOB: 04-04-1946  Age: 75 y.o. MRN: [id number]  CC:  Chief Complaint  Patient presents with   Follow-up    HPI Carl Lam presents for follow-up hypertension.  We added chlorthalidone recently to his lisinopril and amlodipine.  He is not monitoring blood pressures regularly at home.  He has lost about 70 pounds.  He thinks some of this is clothing.  He is still not exercising regularly.  Generally feels well.  No adverse side effects from medication.  No headaches.  No peripheral edema.  No recent chest pains.  Past Medical History:  Diagnosis Date   Acute lower GI bleeding 10/11/2011, 12-16-2019   Anemia    BPH (benign prostatic hyperplasia)    Colon polyp    CVA (cerebral vascular accident) ([place]) 12/14/2019   light stroke per pt gait is off a little per pt   DM type 2 (diabetes mellitus, type 2) ([place]) 10/27/2008   Dyspnea    sob with  heavy exertion   History of blood transfusion 12/16/2019   HYPERLIPIDEMIA 04/27/2009   Qualifier: Diagnosis of  By: Elease Hashimoto MD, Johnathon Olden     HYPERTENSION 10/27/2008   Qualifier: Diagnosis of  By: Valma Cava LPN, Nancy     Intermittent self-catheterization of bladder    daily   Wears dentures    Wears glasses     Past Surgical History:  Procedure Laterality Date   COLONOSCOPY  10/12/2011   Procedure: COLONOSCOPY;  Surgeon: Lafayette Dragon, MD;  Location: [facility] ENDOSCOPY;  Service: Endoscopy;  Laterality: N/A;   COLONOSCOPY WITH PROPOFOL N/A 12/16/2019   Procedure: COLONOSCOPY WITH PROPOFOL;  Surgeon: Ladene Artist, MD;  Location: [facility] ENDOSCOPY;  Service: Endoscopy;  Laterality: N/A;   ESOPHAGOGASTRODUODENOSCOPY  10/12/2011   Procedure: ESOPHAGOGASTRODUODENOSCOPY (EGD);  Surgeon: Lafayette Dragon, MD;  Location: [facility] ENDOSCOPY;  Service: Endoscopy;  Laterality: N/A;   ESOPHAGOGASTRODUODENOSCOPY (EGD) WITH PROPOFOL N/A 12/16/2019   Procedure: ESOPHAGOGASTRODUODENOSCOPY  (EGD) WITH PROPOFOL;  Surgeon: Ladene Artist, MD;  Location: [facility] ENDOSCOPY;  Service: Endoscopy;  Laterality: N/A;   ESOPHAGOGASTRODUODENOSCOPY (EGD) WITH PROPOFOL N/A 05/12/2020   Procedure: ESOPHAGOGASTRODUODENOSCOPY (EGD) WITH PROPOFOL;  Surgeon: Milus Banister, MD;  Location: [facility] ENDOSCOPY;  Service: Endoscopy;  Laterality: N/A;   EUS N/A 05/12/2020   Procedure: UPPER ENDOSCOPIC ULTRASOUND (EUS) RADIAL;  Surgeon: Milus Banister, MD;  Location: [facility] ENDOSCOPY;  Service: Endoscopy;  Laterality: N/A;   HEMOSTASIS CLIP PLACEMENT  12/16/2019   Procedure: HEMOSTASIS CLIP PLACEMENT;  Surgeon: Ladene Artist, MD;  Location: [facility] ENDOSCOPY;  Service: Endoscopy;;   HERNIA REPAIR  2007   "navel"   JOINT REPLACEMENT     KNEE CARTILAGE SURGERY  1960's and 1970's   left; "2 total"   POLYPECTOMY  12/16/2019   Procedure: POLYPECTOMY;  Surgeon: Ladene Artist, MD;  Location: [place];  Service: Endoscopy;;   TOTAL HIP ARTHROPLASTY Right 2007   TRANSURETHRAL RESECTION OF PROSTATE N/A 06/27/2020   Procedure: TRANSURETHRAL RESECTION OF THE PROSTATE (TURP);  Surgeon: Lucas Mallow, MD;  Location: [facility];  Service: Urology;  Laterality: N/A;  REQUESTING 61 MINS FOR CASE    Family History  Problem Relation Age of Onset   Alcohol abuse Other        grandparent   Arthritis Other        arthritis   Diabetes Other  grandparent    Social History   Socioeconomic History   Marital status: Married    Spouse name: Not on file   Number of children: Not on file   Years of education: Not on file   Highest education level: Not on file  Occupational History   Not on file  Tobacco Use   Smoking status: Former    Packs/day: 0.30    Years: 15.00    Pack years: 4.50    Types: Cigarettes    Quit date: 10/11/1978    Years since quitting: 42.4   Smokeless tobacco: Never  Vaping Use   Vaping Use: Never used  Substance and Sexual Activity   Alcohol use: Not Currently     Comment: 10/11/11 "last alcohol 27-28 years ago"   Drug use: Yes    Types: Marijuana    Comment: "recreational marijuana in [employer]; 1970's"   Sexual activity: Not Currently  Other Topics Concern   Not on file  Social History Narrative   Not on file   Social Determinants of Health   Financial Resource Strain: Not on file  Food Insecurity: Not on file  Transportation Needs: Not on file  Physical Activity: Not on file  Stress: Not on file  Social Connections: Not on file  Intimate Partner Violence: Not on file    Outpatient Medications Prior to Visit  Medication Sig Dispense Refill   amLODipine (NORVASC) 10 MG tablet Take 1 tablet (10 mg total) by mouth daily. 90 tablet 3   atorvastatin (LIPITOR) 20 MG tablet Take 1 tablet by mouth once daily 90 tablet 0   carboxymethylcellul-glycerin (EQ LUBRICATING EYE DROPS) 0.5-0.9 % ophthalmic solution Place 1 drop into both eyes daily.     chlorthalidone (HYGROTON) 25 MG tablet Take 1 tablet (25 mg total) by mouth daily. 90 tablet 3   ferrous sulfate 325 (65 FE) MG EC tablet Take 325 mg by mouth daily with breakfast.     gabapentin (NEURONTIN) 100 MG capsule Take 2 capsules by mouth twice daily 360 capsule 0   Insulin Glargine (BASAGLAR KWIKPEN) 100 UNIT/ML INJECT 30 UNITS SUBCUTANEOUSLY ONCE DAILY AT  12  NOON 15 mL 0   lisinopril (ZESTRIL) 20 MG tablet Take 1 tablet by mouth once daily 90 tablet 0   metFORMIN (GLUCOPHAGE) 500 MG tablet Take 2 tablets by mouth twice daily 360 tablet 0   No facility-administered medications prior to visit.    No Known Allergies  ROS Review of Systems  Constitutional:  Negative for fatigue and unexpected weight change.  Eyes:  Negative for visual disturbance.  Respiratory:  Negative for cough, chest tightness and shortness of breath.   Cardiovascular:  Negative for chest pain, palpitations and leg swelling.  Neurological:  Negative for dizziness, syncope, weakness, light-headedness and headaches.      Objective:    Physical Exam Constitutional:      Appearance: He is well-developed.  HENT:     Right Ear: External ear normal.     Left Ear: External ear normal.  Eyes:     Pupils: Pupils are equal, round, and reactive to light.  Neck:     Thyroid: No thyromegaly.  Cardiovascular:     Rate and Rhythm: Normal rate and regular rhythm.  Pulmonary:     Effort: Pulmonary effort is normal. No respiratory distress.     Breath sounds: Normal breath sounds. No wheezing or rales.  Musculoskeletal:     Cervical back: Neck supple.     Right lower  leg: No edema.     Left lower leg: No edema.  Neurological:     Mental Status: He is alert and oriented to person, place, and time.    BP 132/84 (BP Location: Left Arm, Cuff Size: Normal)    Pulse 82    Temp 97.6 F (36.4 C) (Oral)    Wt 235 lb 9.6 oz (106.9 kg)    SpO2 99%    BMI 30.25 kg/m  Wt Readings from Last 3 Encounters:  03/29/21 235 lb 9.6 oz (106.9 kg)  02/27/21 243 lb 1.6 oz (110.3 kg)  11/25/20 237 lb 6.4 oz (107.7 kg)     Health Maintenance Due  Topic Date Due   Pneumonia Vaccine 47+ Years old (1 - PCV) Never done   Zoster Vaccines- Shingrix (1 of 2) Never done   COVID-19 Vaccine (3 - Booster for Pfizer series) 09/24/2019   FOOT EXAM  11/19/2020   OPHTHALMOLOGY EXAM  03/17/2021    There are no preventive care reminders to display for this patient.  Lab Results  Component Value Date   TSH 2.66 07/15/2017   Lab Results  Component Value Date   WBC 6.3 03/13/2021   HGB 13.9 03/13/2021   HCT 42.6 03/13/2021   MCV 91.4 03/13/2021   PLT 250.0 03/13/2021   Lab Results  Component Value Date   NA 141 11/25/2020   K 4.5 11/25/2020   CO2 27 11/25/2020   GLUCOSE 79 11/25/2020   BUN 20 11/25/2020   CREATININE 1.39 11/25/2020   BILITOT 0.6 11/25/2020   ALKPHOS 63 11/25/2020   AST 10 11/25/2020   ALT 10 11/25/2020   PROT 7.2 11/25/2020   ALBUMIN 4.1 11/25/2020   CALCIUM 9.5 11/25/2020   ANIONGAP 7 06/23/2020   GFR  49.96 (L) 11/25/2020   Lab Results  Component Value Date   CHOL 132 11/25/2020   Lab Results  Component Value Date   HDL 43.70 11/25/2020   Lab Results  Component Value Date   LDLCALC 75 11/25/2020   Lab Results  Component Value Date   TRIG 66.0 11/25/2020   Lab Results  Component Value Date   CHOLHDL 3 11/25/2020   Lab Results  Component Value Date   HGBA1C 6.5 (A) 02/27/2021      Assessment & Plan:   Problem List Items Addressed This Visit       Unprioritized   Essential hypertension - Primary   Relevant Orders   Basic metabolic panel  Improved.  Blood pressure repeat after rest 132/84  -Continue chlorthalidone, lisinopril, and amlodipine. -Check basic metabolic panel -Continue high potassium diet -Recheck 6 months  No orders of the defined types were placed in this encounter.   Follow-up: No follow-ups on file.    Carolann Littler, MD

## 2021-04-22 ENCOUNTER — Other Ambulatory Visit: Payer: Self-pay | Admitting: Family Medicine

## 2021-05-12 ENCOUNTER — Other Ambulatory Visit: Payer: Self-pay | Admitting: Family Medicine

## 2021-06-17 ENCOUNTER — Other Ambulatory Visit: Payer: Self-pay | Admitting: Family Medicine

## 2021-06-27 ENCOUNTER — Other Ambulatory Visit: Payer: Self-pay | Admitting: Family Medicine

## 2021-06-27 ENCOUNTER — Other Ambulatory Visit: Payer: Self-pay

## 2021-06-27 DIAGNOSIS — I10 Essential (primary) hypertension: Secondary | ICD-10-CM

## 2021-06-27 MED ORDER — ATORVASTATIN CALCIUM 20 MG PO TABS: 20.0000 mg | ORAL_TABLET | Freq: Every day | ORAL | 0 refills | Status: DC

## 2021-06-28 ENCOUNTER — Other Ambulatory Visit: Payer: Self-pay | Admitting: Family Medicine

## 2021-07-01 ENCOUNTER — Other Ambulatory Visit: Payer: Self-pay | Admitting: Family Medicine

## 2021-07-05 ENCOUNTER — Telehealth: Payer: Self-pay

## 2021-07-05 DIAGNOSIS — K869 Disease of pancreas, unspecified: Secondary | ICD-10-CM

## 2021-07-05 DIAGNOSIS — R948 Abnormal results of function studies of other organs and systems: Secondary | ICD-10-CM

## 2021-07-05 DIAGNOSIS — K862 Cyst of pancreas: Secondary | ICD-10-CM

## 2021-07-05 NOTE — Telephone Encounter (Signed)
Lm on vm for patient to return call.  ? ?MRCP order in epic. Secure staff message sent to radiology scheduling to contact pt to set up his appt. ?

## 2021-07-05 NOTE — Telephone Encounter (Signed)
-----   Message from Yevette Edwards, RN sent at 07/05/2020  9:41 AM EDT ----- ?Regarding: MRI/MRCP ?MRI/MRCP - pancreatic lesion ? ?

## 2021-07-06 NOTE — Telephone Encounter (Signed)
Called and spoke with patient. He is aware that he is due for repeat MRI/MRCP at this time. Pt knows to expect a call from radiology scheduling to set up his appt. Pt states that the last time he needed an RX for Valium to take prior to his MRCP appt. Please advise, thanks.  ?

## 2021-07-06 NOTE — Telephone Encounter (Signed)
Thanks [employer]. Yes we can give him a dose of valium '5mg'$  prior to the MRCP if that will help him relax. thanks ?

## 2021-07-07 MED ORDER — DIAZEPAM 5 MG PO TABS: ORAL_TABLET | ORAL | 0 refills | Status: DC

## 2021-07-07 NOTE — Telephone Encounter (Signed)
MRCP is scheduled for 07/15/21 at 12 pm. Called and spoke with patient. I told him that I am sending in the RX for his Valium. He knows that he will need someone to drive him to his appt since we are sending in this prescription. Pt verbalized understanding and had no concerns at the end of the call. ? ?Called in Valium RX. I gave a verbal order to [facility], pharmacist.  ?

## 2021-07-07 NOTE — Addendum Note (Signed)
Addended by: Yevette Edwards on: 07/07/2021 11:36 AM ? ? Modules accepted: Orders ? ?

## 2021-07-11 ENCOUNTER — Other Ambulatory Visit: Payer: Self-pay | Admitting: Family Medicine

## 2021-07-15 ENCOUNTER — Other Ambulatory Visit: Payer: Self-pay | Admitting: Gastroenterology

## 2021-07-15 ENCOUNTER — Ambulatory Visit (HOSPITAL_COMMUNITY)
Admission: RE | Admit: 2021-07-15 | Discharge: 2021-07-15 | Disposition: A | Discharge status: Home / Self Care | Payer: Federal, State, Local not specified - PPO | Source: Ambulatory Visit | Attending: Gastroenterology | Admitting: Gastroenterology

## 2021-07-15 DIAGNOSIS — K869 Disease of pancreas, unspecified: Secondary | ICD-10-CM | POA: Diagnosis present

## 2021-07-15 DIAGNOSIS — K862 Cyst of pancreas: Secondary | ICD-10-CM | POA: Insufficient documentation

## 2021-07-15 DIAGNOSIS — R948 Abnormal results of function studies of other organs and systems: Secondary | ICD-10-CM | POA: Insufficient documentation

## 2021-07-15 MED ORDER — GADOBUTROL 1 MMOL/ML IV SOLN
10.0000 mL | Freq: Once | INTRAVENOUS | Status: AC | PRN
  Administered 2021-07-15: 10 mL via INTRAVENOUS

## 2021-07-19 ENCOUNTER — Telehealth: Payer: Self-pay

## 2021-07-19 NOTE — Telephone Encounter (Signed)
I spoke to the patient about results, details in result note for MRCP.  He will be scheduled for EUS with Dr. Ardis Hughs. ?

## 2021-07-19 NOTE — Telephone Encounter (Signed)
-----   Message from Yetta Flock, MD sent at 07/19/2021  1:50 PM EDT ----- ?Regarding: RE: mutual patient ?Thanks Linna Hoff. ?I called and got ahold of him. He wants to proceed with another EUS to see if this is amenable to a biopsy. ? ?Chong Sicilian can you help coordinate? Thanks! ? ?----- Message ----- ?From: Milus Banister, MD ?Sent: 07/17/2021   8:03 PM EDT ?To: Yetta Flock, MD ?Subject: RE: mutual patient                            ? ?Richardson Landry, It's probably worth another try with EUS. Very hard to know if the vessels will still be in the way until he's on the table. If he is up for another try, let me know and I"ll have Aviendha Azbell get in touch ? ?Thanks ? ? ?----- Message ----- ?From: Yetta Flock, MD ?Sent: 07/17/2021   5:34 PM EDT ?To: Milus Banister, MD ?Subject: mutual patient                                ? ?Melissa Montane, ?Wanted to run this case by you.  You helped out with an EUS a little over a year ago for this patient.  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  We have been following with MRCP but it is slowly enlarging.  Curious what you thought about repeat EUS, and if too risky, do think his case should be presented at GI cancer conference?  I have not seen him in a while but he does have some comorbidities which may make him higher surgical risk, history of stroke, etc. ?Thanks for your thoughts. ? ?Richardson Landry ? ? ? ?

## 2021-07-19 NOTE — Telephone Encounter (Signed)
Patient is returning Dr. Doyne Keel call regarding MRCP results. ?

## 2021-07-20 ENCOUNTER — Other Ambulatory Visit: Payer: Self-pay

## 2021-07-20 DIAGNOSIS — K869 Disease of pancreas, unspecified: Secondary | ICD-10-CM

## 2021-07-20 NOTE — Telephone Encounter (Signed)
The pt has been scheduled for 6/22 at 730 am at [facility] with DJ  ?Left message on machine to call back  ?

## 2021-07-20 NOTE — Telephone Encounter (Signed)
EUS orders entered NEED June DATE  ?

## 2021-07-24 NOTE — Telephone Encounter (Signed)
EUS scheduled, pt instructed and medications reviewed.  Patient instructions mailed to home.  Patient to call with any questions or concerns.  

## 2021-08-08 ENCOUNTER — Other Ambulatory Visit: Payer: Self-pay | Admitting: Family Medicine

## 2021-09-04 ENCOUNTER — Telehealth: Payer: Self-pay

## 2021-09-04 DIAGNOSIS — D509 Iron deficiency anemia, unspecified: Secondary | ICD-10-CM

## 2021-09-04 NOTE — Telephone Encounter (Signed)
Called and left detailed message for patient to go to the lab one day this week or next.

## 2021-09-04 NOTE — Telephone Encounter (Signed)
-----   Message from Roetta Sessions, [place] sent at 03/14/2021  4:56 PM EST ----- Regarding: repeat CBC Patient will be due for CBC to recheck Hgb at the end of May.

## 2021-09-05 ENCOUNTER — Other Ambulatory Visit (INDEPENDENT_AMBULATORY_CARE_PROVIDER_SITE_OTHER): Payer: Federal, State, Local not specified - PPO

## 2021-09-05 DIAGNOSIS — D509 Iron deficiency anemia, unspecified: Secondary | ICD-10-CM | POA: Diagnosis not present

## 2021-09-05 LAB — CBC WITH DIFFERENTIAL/PLATELET
Basophils Absolute: 0.1 10*3/uL (ref 0.0–0.1)
Basophils Relative: 0.9 % (ref 0.0–3.0)
Eosinophils Absolute: 0.1 10*3/uL (ref 0.0–0.7)
Eosinophils Relative: 1.9 % (ref 0.0–5.0)
HCT: 38.7 % — ABNORMAL LOW (ref 39.0–52.0)
Hemoglobin: 13 g/dL (ref 13.0–17.0)
Lymphocytes Relative: 30.8 % (ref 12.0–46.0)
Lymphs Abs: 2.2 10*3/uL (ref 0.7–4.0)
MCHC: 33.6 g/dL (ref 30.0–36.0)
MCV: 91 fl (ref 78.0–100.0)
Monocytes Absolute: 0.5 10*3/uL (ref 0.1–1.0)
Monocytes Relative: 7.5 % (ref 3.0–12.0)
Neutro Abs: 4.1 10*3/uL (ref 1.4–7.7)
Neutrophils Relative %: 58.9 % (ref 43.0–77.0)
Platelets: 265 10*3/uL (ref 150.0–400.0)
RBC: 4.25 Mil/uL (ref 4.22–5.81)
RDW: 14.4 % (ref 11.5–15.5)
WBC: 7 10*3/uL (ref 4.0–10.5)

## 2021-09-05 NOTE — Telephone Encounter (Signed)
Patient came for CBC today. Results available to Dr Havery Moros in [place].

## 2021-09-18 ENCOUNTER — Other Ambulatory Visit: Payer: Self-pay | Admitting: Family Medicine

## 2021-09-18 DIAGNOSIS — I10 Essential (primary) hypertension: Secondary | ICD-10-CM

## 2021-09-27 ENCOUNTER — Ambulatory Visit: Payer: Federal, State, Local not specified - PPO | Admitting: Family Medicine

## 2021-09-27 ENCOUNTER — Encounter: Payer: Self-pay | Admitting: Family Medicine

## 2021-09-27 VITALS — BP 122/78 | HR 75 | Temp 97.8°F | Ht 74.0 in | Wt 238.3 lb

## 2021-09-27 DIAGNOSIS — Z794 Long term (current) use of insulin: Secondary | ICD-10-CM | POA: Diagnosis not present

## 2021-09-27 DIAGNOSIS — I10 Essential (primary) hypertension: Secondary | ICD-10-CM

## 2021-09-27 DIAGNOSIS — E1165 Type 2 diabetes mellitus with hyperglycemia: Secondary | ICD-10-CM

## 2021-09-27 DIAGNOSIS — E785 Hyperlipidemia, unspecified: Secondary | ICD-10-CM

## 2021-09-27 LAB — POCT GLYCOSYLATED HEMOGLOBIN (HGB A1C): Hemoglobin A1C: 7.4 % — AB (ref 4.0–5.6)

## 2021-09-27 NOTE — Progress Notes (Signed)
Established Patient Office Visit  Subjective   Patient ID: EWALD BEG, male    DOB: [birth year]  Age: 76 y.o. MRN: [id number]  Chief Complaint  Patient presents with   Follow-up    HPI   Here for medical follow-up.  He has history of hypertension, past history of CVA, history of diverticulosis, type 2 diabetes, dyslipidemia.  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  Generally feels well.  He has recently started exercising more consistently.  His blood pressures been well controlled with combination therapy with amlodipine, lisinopril, and chlorthalidone.  No dizziness.  No chest pains.  He remains on atorvastatin for hyperlipidemia.  No myalgias.  Last A1c was 6.5%.  He states fasting sugars are generally 130 or less.  Compliant with metformin and currently taking Basaglar 30 units once daily.  Past Medical History:  Diagnosis Date   Acute lower GI bleeding 10/11/2011, 12-16-2019   Anemia    BPH (benign prostatic hyperplasia)    Colon polyp    CVA (cerebral vascular accident) ([place]) 12/14/2019   light stroke per pt gait is off a little per pt   DM type 2 (diabetes mellitus, type 2) ([place]) 10/27/2008   Dyspnea    sob with  heavy exertion   History of blood transfusion 12/16/2019   HYPERLIPIDEMIA 04/27/2009   Qualifier: Diagnosis of  By: Elease Hashimoto MD, Santia Labate     HYPERTENSION 10/27/2008   Qualifier: Diagnosis of  By: Valma Cava LPN, Nancy     Intermittent self-catheterization of bladder    daily   Wears dentures    Wears glasses    Past Surgical History:  Procedure Laterality Date   COLONOSCOPY  10/12/2011   Procedure: COLONOSCOPY;  Surgeon: Lafayette Dragon, MD;  Location: [facility] ENDOSCOPY;  Service: Endoscopy;  Laterality: N/A;   COLONOSCOPY WITH PROPOFOL N/A 12/16/2019   Procedure: COLONOSCOPY WITH PROPOFOL;  Surgeon: Ladene Artist, MD;  Location: [facility]  ENDOSCOPY;  Service: Endoscopy;  Laterality: N/A;   ESOPHAGOGASTRODUODENOSCOPY  10/12/2011   Procedure: ESOPHAGOGASTRODUODENOSCOPY (EGD);  Surgeon: Lafayette Dragon, MD;  Location: [facility] ENDOSCOPY;  Service: Endoscopy;  Laterality: N/A;   ESOPHAGOGASTRODUODENOSCOPY (EGD) WITH PROPOFOL N/A 12/16/2019   Procedure: ESOPHAGOGASTRODUODENOSCOPY (EGD) WITH PROPOFOL;  Surgeon: Ladene Artist, MD;  Location[facility] ENDOSCOPY;  Service: Endoscopy;  Laterality: N/A;   ESOPHAGOGASTRODUODENOSCOPY (EGD) WITH PROPOFOL N/A 05/12/2020   Procedure: ESOPHAGOGASTRODUODENOSCOPY (EGD) WITH PROPOFOL;  Surgeon: Milus Banister, MD;  Location: [facility] ENDOSCOPY;  Service: Endoscopy;  Laterality: N/A;   EUS N/A 05/12/2020   Procedure: UPPER ENDOSCOPIC ULTRASOUND (EUS) RADIAL;  Surgeon: Milus Banister, MD;  Location: [facility] ENDOSCOPY;  Service: Endoscopy;  Laterality: N/A;   HEMOSTASIS CLIP PLACEMENT  12/16/2019   Procedure: HEMOSTASIS CLIP PLACEMENT;  Surgeon: Ladene Artist, MD;  Location: [facility] ENDOSCOPY;  Service: Endoscopy;;   HERNIA REPAIR  2007   "navel"   JOINT REPLACEMENT     KNEE CARTILAGE SURGERY  1960's and 1970's   left; "2 total"   POLYPECTOMY  12/16/2019   Procedure: POLYPECTOMY;  Surgeon: Ladene Artist, MD;  Location: [place];  Service: Endoscopy;;   TOTAL HIP ARTHROPLASTY Right 2007   TRANSURETHRAL RESECTION OF PROSTATE N/A 06/27/2020   Procedure: TRANSURETHRAL RESECTION OF THE PROSTATE (TURP);  Surgeon: Lucas Mallow, MD;  Location: [facility];  Service: Urology;  Laterality: N/A;  REQUESTING 90 MINS FOR  CASE    reports that he quit smoking about 42 years ago. His smoking use included cigarettes. He has a 4.50 pack-year smoking history. He has never used smokeless tobacco. He reports that he does not currently use alcohol. He reports current drug use. Drug: Marijuana. family history includes Alcohol abuse in an other family member; Arthritis in an other family member; Diabetes in an other family  member. No Known Allergies   Review of Systems  Constitutional:  Negative for malaise/fatigue and weight loss.  Eyes:  Negative for blurred vision.  Respiratory:  Negative for shortness of breath.   Cardiovascular:  Negative for chest pain.  Neurological:  Negative for dizziness, weakness and headaches.      Objective:     BP 122/78 (BP Location: Left Arm, Cuff Size: Normal)   Pulse 75   Temp 97.8 F (36.6 C) (Oral)   Ht '6\' 2"'$  (1.88 m)   Wt 238 lb 4.8 oz (108.1 kg)   SpO2 98%   BMI 30.60 kg/m    Physical Exam Constitutional:      Appearance: He is well-developed.  Eyes:     Pupils: Pupils are equal, round, and reactive to light.  Neck:     Thyroid: No thyromegaly.  Cardiovascular:     Rate and Rhythm: Normal rate and regular rhythm.  Pulmonary:     Effort: Pulmonary effort is normal. No respiratory distress.     Breath sounds: Normal breath sounds. No wheezing or rales.  Musculoskeletal:     Cervical back: Neck supple.     Right lower leg: No edema.     Left lower leg: No edema.  Neurological:     Mental Status: He is alert and oriented to person, place, and time.      Results for orders placed or performed in visit on 09/27/21  POCT glycosylated hemoglobin (Hb A1C)  Result Value Ref Range   Hemoglobin A1C 7.4 (A) 4.0 - 5.6 %   HbA1c POC (<> result, manual entry)     HbA1c, POC (prediabetic range)     HbA1c, POC (controlled diabetic range)        The ASCVD Risk score (Arnett DK, et al., 2019) failed to calculate for the following reasons:   The patient has a prior MI or stroke diagnosis    Assessment & Plan:   #1 type 2 diabetes.  Worsening control with A1c today 7.4%.  Was 6.5%.  Fasting blood sugars have been relatively stable.  We did discuss possible addition of other agents such as SGLT2 inhibitor.  He would like to give this 3 months of increased exercise and dietary modification and this seems reasonable.  If not improved at follow-up consider  possible addition of Jardiance  #2 hypertension.  Initial reading was up here but repeat controlled.  Continue current therapy with amlodipine, lisinopril, and chlorthalidone.  Check basic metabolic panel at follow-up  #3 dyslipidemia.  Patient on Lipitor.  Goal LDL less than 70.  Recheck fasting lipid and hepatic panel at follow-up   Return in about 3 months (around 12/28/2021).    Carolann Littler, MD

## 2021-09-27 NOTE — Patient Instructions (Signed)
Let's plan on 3 month follow up  A1C up to 7.4%  Increase exercise and we will check in 3 months and if not better at that time will consider additional medications.

## 2021-10-05 ENCOUNTER — Encounter (HOSPITAL_COMMUNITY)
Admission: RE | Disposition: A | Discharge status: Home / Self Care | Payer: Self-pay | Source: Ambulatory Visit | Attending: Gastroenterology

## 2021-10-05 ENCOUNTER — Ambulatory Visit (HOSPITAL_COMMUNITY): Payer: Federal, State, Local not specified - PPO | Admitting: Anesthesiology

## 2021-10-05 ENCOUNTER — Ambulatory Visit (HOSPITAL_COMMUNITY)
Admission: RE | Admit: 2021-10-05 | Discharge: 2021-10-05 | Disposition: A | Discharge status: Home / Self Care | Payer: Federal, State, Local not specified - PPO | Source: Ambulatory Visit | Attending: Gastroenterology | Admitting: Gastroenterology

## 2021-10-05 ENCOUNTER — Encounter: Payer: Self-pay | Admitting: Gastroenterology

## 2021-10-05 ENCOUNTER — Encounter (HOSPITAL_COMMUNITY): Payer: Self-pay | Admitting: Gastroenterology

## 2021-10-05 ENCOUNTER — Other Ambulatory Visit: Payer: Self-pay

## 2021-10-05 DIAGNOSIS — E785 Hyperlipidemia, unspecified: Secondary | ICD-10-CM | POA: Diagnosis not present

## 2021-10-05 DIAGNOSIS — Z8673 Personal history of transient ischemic attack (TIA), and cerebral infarction without residual deficits: Secondary | ICD-10-CM | POA: Insufficient documentation

## 2021-10-05 DIAGNOSIS — Z6830 Body mass index (BMI) 30.0-30.9, adult: Secondary | ICD-10-CM | POA: Insufficient documentation

## 2021-10-05 DIAGNOSIS — I10 Essential (primary) hypertension: Secondary | ICD-10-CM | POA: Diagnosis not present

## 2021-10-05 DIAGNOSIS — K8689 Other specified diseases of pancreas: Secondary | ICD-10-CM | POA: Insufficient documentation

## 2021-10-05 DIAGNOSIS — E669 Obesity, unspecified: Secondary | ICD-10-CM | POA: Insufficient documentation

## 2021-10-05 DIAGNOSIS — Z87891 Personal history of nicotine dependence: Secondary | ICD-10-CM | POA: Insufficient documentation

## 2021-10-05 DIAGNOSIS — K862 Cyst of pancreas: Secondary | ICD-10-CM | POA: Insufficient documentation

## 2021-10-05 DIAGNOSIS — K869 Disease of pancreas, unspecified: Secondary | ICD-10-CM

## 2021-10-05 DIAGNOSIS — Z7984 Long term (current) use of oral hypoglycemic drugs: Secondary | ICD-10-CM | POA: Insufficient documentation

## 2021-10-05 DIAGNOSIS — Z794 Long term (current) use of insulin: Secondary | ICD-10-CM | POA: Diagnosis not present

## 2021-10-05 DIAGNOSIS — E119 Type 2 diabetes mellitus without complications: Secondary | ICD-10-CM | POA: Diagnosis not present

## 2021-10-05 HISTORY — PX: EUS: SHX5427

## 2021-10-05 HISTORY — PX: FINE NEEDLE ASPIRATION: SHX5430

## 2021-10-05 HISTORY — PX: ESOPHAGOGASTRODUODENOSCOPY (EGD) WITH PROPOFOL: SHX5813

## 2021-10-05 LAB — GLUCOSE, CAPILLARY: Glucose-Capillary: 110 mg/dL — ABNORMAL HIGH (ref 70–99)

## 2021-10-05 LAB — PANC CYST FLD ANLYS-PATHFNDR-TG

## 2021-10-05 SURGERY — ESOPHAGOGASTRODUODENOSCOPY (EGD) WITH PROPOFOL
Anesthesia: Monitor Anesthesia Care

## 2021-10-05 MED ORDER — CIPROFLOXACIN IN D5W 400 MG/200ML IV SOLN
INTRAVENOUS | Status: DC | PRN
  Administered 2021-10-05: 400 mg via INTRAVENOUS

## 2021-10-05 MED ORDER — CIPROFLOXACIN HCL 500 MG PO TABS: 500.0000 mg | ORAL_TABLET | Freq: Two times a day (BID) | ORAL | 0 refills | Status: DC

## 2021-10-05 MED ORDER — LACTATED RINGERS IV SOLN: INTRAVENOUS | Status: DC

## 2021-10-05 MED ORDER — PROPOFOL 500 MG/50ML IV EMUL
INTRAVENOUS | Status: DC | PRN
  Administered 2021-10-05: 130 ug/kg/min via INTRAVENOUS

## 2021-10-05 MED ORDER — CIPROFLOXACIN IN D5W 400 MG/200ML IV SOLN
INTRAVENOUS | Status: AC
  Filled 2021-10-05: qty 200

## 2021-10-05 MED ORDER — SODIUM CHLORIDE 0.9 % IV SOLN: INTRAVENOUS | Status: DC

## 2021-10-05 MED ORDER — PROPOFOL 1000 MG/100ML IV EMUL
INTRAVENOUS | Status: AC
  Filled 2021-10-05: qty 100

## 2021-10-05 MED ORDER — PROPOFOL 10 MG/ML IV BOLUS
INTRAVENOUS | Status: DC | PRN
  Administered 2021-10-05: 10 mg via INTRAVENOUS
  Administered 2021-10-05 (×2): 20 mg via INTRAVENOUS
  Administered 2021-10-05: 10 mg via INTRAVENOUS
  Administered 2021-10-05: 20 mg via INTRAVENOUS
  Administered 2021-10-05 (×2): 10 mg via INTRAVENOUS

## 2021-10-05 SURGICAL SUPPLY — 15 items

## 2021-10-05 NOTE — Op Note (Addendum)
[facility] Patient Name: Carl Lam Procedure Date: 10/05/2021 MRN: [id number] Attending MD: Milus Banister , MD Date of Birth: 1945/08/28 CSN: [id number] Age: 76 Admit Type: Outpatient Procedure:                Upper EUS Indications:              Enlarging tail of pancreas lesion. MRI report                            07/2021 "Elongated multi septated cystic lesion                            again noted in the pancreatic tail. This measures a                            maximum 5.3 x 3.7 cm. On the prior study it                            measured 4.7 x 3.2 cm when remeasured with the same                            imaging plane. When remeasured on a prior CT scan                            from 2021 it measured 4.2 x 2.5 cm. After contrast                            administration there appears to be a more obvious                            focus of somewhat nodular enhancement within the                            cystic lesion which measures 9 mm." EUS 04/2020 Dr.                            Ardis Hughs unable to perform FNA due to signficant                            overlying blood vessels. No h/o pancreatic disease.                            No abd pains. No weight loss. Providers:                Milus Banister, MD, Carlyn Reichert, RN, Theodora Blow, Technician Referring MD:             Jolly Mango, MD Medicines:                Monitored Anesthesia Care. cipro '400mg'$  IV Complications:            No immediate complications.  Estimated blood loss:                            None. Estimated Blood Loss:     Estimated blood loss: none. Procedure:                Pre-Anesthesia Assessment:                           - Prior to the procedure, a History and Physical                            was performed, and patient medications and                            allergies were reviewed. The patient's tolerance of                             previous anesthesia was also reviewed. The risks                            and benefits of the procedure and the sedation                            options and risks were discussed with the patient.                            All questions were answered, and informed consent                            was obtained. Prior Anticoagulants: The patient has                            taken no previous anticoagulant or antiplatelet                            agents. ASA Grade Assessment: II - A patient with                            mild systemic disease. After reviewing the risks                            and benefits, the patient was deemed in                            satisfactory condition to undergo the procedure.                           After obtaining informed consent, the endoscope was                            passed under direct vision. Throughout the                            procedure, the  patient's blood pressure, pulse, and                            oxygen saturations were monitored continuously. The                            GF-UE190-AL5 ([id number]) Olympus radial ultrasound                            scope was introduced through the mouth, and                            advanced to the second part of duodenum. The                            [id number] ([id number]) Olympus linear ultrasound scope                            was introduced through the mouth, and advanced to                            the second part of duodenum. The upper EUS was                            accomplished without difficulty. The patient                            tolerated the procedure well. Scope In: Scope Out: Findings:      ENDOSCOPIC FINDING (limited views with EUS scopes): :      The examined esophagus was endoscopically normal.      The entire examined stomach was endoscopically normal.      The examined duodenum was endoscopically normal.      ENDOSONOGRAPHIC FINDING: :      1.  Multicystic lesion and extreme tail of pancreas measuring 3.6 cm       maximally on this exam. This lesions consists of multiple very small       cysts, the largest measuring about 6 mm across. There were thick septae       versus nodular component centrally. This did not have the overall look       of a neoplastic lesion. I performed transgastric fine-needle aspiration       using a 22-gauge EUS expect needle. First targeting the largest cyst       cavity, I aspirated about 2 cc of clear thin fluid and sent this for CEA       and amylase levels. I then aspirated the thick septae versus internal       nodular component with a single pass of the same needle and sent the       aspirate for cytology testing.      2. The cystic lesion above did not have clear communication with the       main pancreatic duct which was completely normal-appearing.      3. There was a second very small cyst in the head of the pancreas       measuring 7 mm across. This was not sampled, it was round  and anechoic       and completely innocent appearing.      4. No peripancreatic adenopathy      5. The pancreatic parenchyma was otherwise normal.      6. CBD was normal nondilated without stones.      7. Gallbladder was normal.      8. Limited view of the liver, spleen, portal and splenic vessels were       all normal. Impression:               - Incidental tail of pancreas cystic lesion,                            present at least 2 years now. 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                            I sampled fluid from one of the largest cyst                            cavities and also sampled tissue from the internal                            thick septae component versus nodularity. Await                            final fluid chemical testing and cytology testing.                           - He will complete 3 days of twice daily Cipro to                             decrease the chances of infection. Moderate Sedation:      Not Applicable - Patient had care per Anesthesia. Recommendation:           - Discharge patient to home. Procedure Code(s):        --- Professional ---                           [phone], Esophagogastroduodenoscopy, flexible,                            transoral; with transendoscopic ultrasound-guided                            intramural or transmural fine needle                            aspiration/biopsy(s), (includes endoscopic                            ultrasound examination limited to the esophagus,  stomach or duodenum, and adjacent structures) Diagnosis Code(s):        --- Professional ---                           K86.2, Cyst of pancreas                           R93.3, Abnormal findings on diagnostic imaging of                            other parts of digestive tract CPT copyright 2019 [employer]. All rights reserved. The codes documented in this report are preliminary and upon coder review may  be revised to meet current compliance requirements. Milus Banister, MD 10/05/2021 8:16:57 AM This report has been signed electronically. Number of Addenda: 0

## 2021-10-05 NOTE — Discharge Instructions (Signed)
YOU HAD AN ENDOSCOPIC PROCEDURE TODAY: Refer to the procedure report and other information in the discharge instructions given to you for any specific questions about what was found during the examination. If this information does not answer your questions, please call [place] office at [phone] to clarify.  ° °YOU SHOULD EXPECT: Some feelings of bloating in the abdomen. Passage of more gas than usual. Walking can help get rid of the air that was put into your GI tract during the procedure and reduce the bloating. If you had a lower endoscopy (such as a colonoscopy or flexible sigmoidoscopy) you may notice spotting of blood in your stool or on the toilet paper. Some abdominal soreness may be present for a day or two, also. ° °DIET: Your first meal following the procedure should be a light meal and then it is ok to progress to your normal diet. A half-sandwich or bowl of soup is an example of a good first meal. Heavy or fried foods are harder to digest and may make you feel nauseous or bloated. Drink plenty of fluids but you should avoid alcoholic beverages for 24 hours. If you had a esophageal dilation, please see attached instructions for diet.   ° °ACTIVITY: Your care partner should take you home directly after the procedure. You should plan to take it easy, moving slowly for the rest of the day. You can resume normal activity the day after the procedure however YOU SHOULD NOT DRIVE, use power tools, machinery or perform tasks that involve climbing or major physical exertion for 24 hours (because of the sedation medicines used during the test).  ° °SYMPTOMS TO REPORT IMMEDIATELY: °A gastroenterologist can be reached at any hour. Please call [phone]  for any of the following symptoms:  °Following lower endoscopy (colonoscopy, flexible sigmoidoscopy) °Excessive amounts of blood in the stool  °Significant tenderness, worsening of abdominal pains  °Swelling of the abdomen that is new, acute  °Fever of 100° or  higher  °Following upper endoscopy (EGD, EUS, ERCP, esophageal dilation) °Vomiting of blood or coffee ground material  °New, significant abdominal pain  °New, significant chest pain or pain under the shoulder blades  °Painful or persistently difficult swallowing  °New shortness of breath  °Black, tarry-looking or red, bloody stools ° °FOLLOW UP:  °If any biopsies were taken you will be contacted by phone or by letter within the next 1-3 weeks. Call [phone]  if you have not heard about the biopsies in 3 weeks.  °Please also call with any specific questions about appointments or follow up tests. ° °

## 2021-10-05 NOTE — Interval H&P Note (Signed)
History and Physical Interval Note:  10/05/2021 7:05 AM  Carl Lam  has presented today for surgery, with the diagnosis of panc lesion.  The various methods of treatment have been discussed with the patient and family. After consideration of risks, benefits and other options for treatment, the patient has consented to  Procedure(s): UPPER ENDOSCOPIC ULTRASOUND (EUS) RADIAL (N/A) ESOPHAGOGASTRODUODENOSCOPY (EGD) WITH PROPOFOL (N/A) as a surgical intervention.  The patient's history has been reviewed, patient examined, no change in status, stable for surgery.  I have reviewed the patient's chart and labs.  Questions were answered to the patient's satisfaction.     Milus Banister

## 2021-10-05 NOTE — Anesthesia Postprocedure Evaluation (Signed)
Anesthesia Post Note  Patient: Carl Lam  Procedure(s) Performed: UPPER ENDOSCOPIC ULTRASOUND (EUS) RADIAL ESOPHAGOGASTRODUODENOSCOPY (EGD) WITH PROPOFOL FINE NEEDLE ASPIRATION (FNA) LINEAR UPPER ENDOSCOPIC ULTRASOUND (EUS) LINEAR     Patient location during evaluation: Endoscopy Anesthesia Type: MAC Level of consciousness: awake and alert Pain management: pain level controlled Vital Signs Assessment: post-procedure vital signs reviewed and stable Respiratory status: spontaneous breathing, nonlabored ventilation, respiratory function stable and patient connected to nasal cannula oxygen Cardiovascular status: stable and blood pressure returned to baseline Postop Assessment: no apparent nausea or vomiting Anesthetic complications: no   No notable events documented.  Last Vitals:  Vitals:   10/05/21 0819 10/05/21 0829  BP: 129/78 124/76  Pulse: 73 64  Resp: 20 (!) 21  Temp:    SpO2: 97% 97%    Last Pain:  Vitals:   10/05/21 0829  TempSrc:   PainSc: 0-No pain                 Santa Lighter

## 2021-10-05 NOTE — Transfer of Care (Signed)
Immediate Anesthesia Transfer of Care Note  Patient: Lasandra Beech  Procedure(s) Performed: UPPER ENDOSCOPIC ULTRASOUND (EUS) RADIAL ESOPHAGOGASTRODUODENOSCOPY (EGD) WITH PROPOFOL FINE NEEDLE ASPIRATION (FNA) LINEAR UPPER ENDOSCOPIC ULTRASOUND (EUS) LINEAR  Patient Location: PACU  Anesthesia Type:MAC  Level of Consciousness: sedated  Airway & Oxygen Therapy: Patient Spontanous Breathing and Patient connected to face mask oxygen  Post-op Assessment: Report given to RN and Post -op Vital signs reviewed and stable  Post vital signs: Reviewed and stable  Last Vitals:  Vitals Value Taken Time  BP    Temp    Pulse    Resp    SpO2      Last Pain:  Vitals:   10/05/21 0700  TempSrc: Temporal  PainSc: 0-No pain         Complications: No notable events documented.

## 2021-10-05 NOTE — Anesthesia Preprocedure Evaluation (Signed)
Anesthesia Evaluation  Patient identified by MRN, date of birth, ID band Patient awake    Reviewed: Allergy & Precautions, NPO status , Patient's Chart, lab work & pertinent test results  Airway Mallampati: II  TM Distance: >3 FB Neck ROM: Full    Dental  (+) Dental Advisory Given, Edentulous Lower, Edentulous Upper   Pulmonary neg pulmonary ROS, former smoker,    Pulmonary exam normal breath sounds clear to auscultation       Cardiovascular hypertension, Pt. on medications Normal cardiovascular exam Rhythm:Regular Rate:Normal     Neuro/Psych CVA, Residual Symptoms negative psych ROS   GI/Hepatic negative GI ROS, Pancreatic lesion   Endo/Other  diabetes, Type 2, Oral Hypoglycemic Agents, Insulin DependentObesity   Renal/GU negative Renal ROS Bladder dysfunction      Musculoskeletal  (+) Arthritis ,   Abdominal   Peds  Hematology negative hematology ROS (+)   Anesthesia Other Findings Day of surgery medications reviewed with the patient.  Reproductive/Obstetrics                             Anesthesia Physical Anesthesia Plan  ASA: 3  Anesthesia Plan: MAC   Post-op Pain Management:    Induction: Intravenous  PONV Risk Score and Plan: 1 and TIVA and Treatment may vary due to age or medical condition  Airway Management Planned: Natural Airway and Nasal Cannula  Additional Equipment:   Intra-op Plan:   Post-operative Plan:   Informed Consent: I have reviewed the patients History and Physical, chart, labs and discussed the procedure including the risks, benefits and alternatives for the proposed anesthesia with the patient or authorized representative who has indicated his/her understanding and acceptance.     Dental advisory given  Plan Discussed with: CRNA  Anesthesia Plan Comments:         Anesthesia Quick Evaluation

## 2021-10-06 ENCOUNTER — Encounter: Payer: Self-pay | Admitting: Gastroenterology

## 2021-10-09 ENCOUNTER — Other Ambulatory Visit: Payer: Self-pay | Admitting: Family Medicine

## 2021-10-09 LAB — CYTOLOGY - NON PAP

## 2021-10-11 ENCOUNTER — Telehealth: Payer: Self-pay | Admitting: Gastroenterology

## 2021-10-11 NOTE — Telephone Encounter (Signed)
Cyst fluid test results reviewed.  Cytology: FINAL MICROSCOPIC DIAGNOSIS:  - Atypical cells present  -Scant mucinous epithelium.   CEA level was 2.2 ng/mL which is very low Amylase was 17 units/L which is also very low     Carl Lam, Please contact him, these results are equivocal.  Certainly no sign of cancer in his pancreas but this might indeed be a potentially precancerous lesion given the mucinous cells and the "atypical cells" described by pathology.  He has 0 symptoms from this.  The cyst has been present for at least 2 years.   I recommend repeat MRI with MRCP in 6 months.  Carl Lam can you go ahead and put that in your reminder system.     Carl Lam, See above.  I think a repeat MRI in 6 months is reasonable.

## 2021-10-11 NOTE — Telephone Encounter (Signed)
Dan thanks for the follow up and helping with his case. Agree with your plan.

## 2021-10-11 NOTE — Telephone Encounter (Signed)
Left message for patient to return call for results. Will continue efforts.

## 2021-10-11 NOTE — Telephone Encounter (Signed)
Patient notified of results per Dr Ardis Hughs.  Patient was advised that he will need a repeat MRI with MRCP in December 2023.  Staff message sent to myself as a reminder.  Patient agreed to plan and verbalized understanding.  No further questions.

## 2021-10-15 ENCOUNTER — Other Ambulatory Visit: Payer: Self-pay | Admitting: Family Medicine

## 2021-11-06 ENCOUNTER — Other Ambulatory Visit: Payer: Self-pay | Admitting: Family Medicine

## 2021-12-18 ENCOUNTER — Other Ambulatory Visit: Payer: Self-pay | Admitting: Family Medicine

## 2021-12-18 DIAGNOSIS — I10 Essential (primary) hypertension: Secondary | ICD-10-CM

## 2021-12-29 ENCOUNTER — Encounter: Payer: Self-pay | Admitting: Family Medicine

## 2021-12-29 ENCOUNTER — Ambulatory Visit: Payer: Federal, State, Local not specified - PPO | Admitting: Family Medicine

## 2021-12-29 VITALS — BP 126/70 | HR 73 | Temp 97.9°F | Ht 74.0 in | Wt 235.0 lb

## 2021-12-29 DIAGNOSIS — E1165 Type 2 diabetes mellitus with hyperglycemia: Secondary | ICD-10-CM | POA: Diagnosis not present

## 2021-12-29 DIAGNOSIS — I10 Essential (primary) hypertension: Secondary | ICD-10-CM | POA: Diagnosis not present

## 2021-12-29 DIAGNOSIS — Z794 Long term (current) use of insulin: Secondary | ICD-10-CM

## 2021-12-29 DIAGNOSIS — Z8673 Personal history of transient ischemic attack (TIA), and cerebral infarction without residual deficits: Secondary | ICD-10-CM

## 2021-12-29 LAB — POCT GLYCOSYLATED HEMOGLOBIN (HGB A1C): Hemoglobin A1C: 5.9 % — AB (ref 4.0–5.6)

## 2021-12-29 NOTE — Patient Instructions (Signed)
Reduce Basaglar to 24 units daily and may decrease 2 units every 3 days as long as fasting glucose stays < 130.

## 2021-12-29 NOTE — Progress Notes (Signed)
Established Patient Office Visit  Subjective   Patient ID: Carl Lam, male    DOB: 28-Feb-1946  Age: 76 y.o. MRN: [id number]  Chief Complaint  Patient presents with   Follow-up    HPI   Carl Lam is seen for medical follow-up.  He has been exercising more since last visit in fact about 30 minutes 6 days/week.  Blood sugars consistently less than 130 and frequently in the 90 range.  No hypoglycemia.  Remains on metformin and Basaglar 30 units daily.  His blood pressure is treated with lisinopril and amlodipine as well as chlorthalidone.  He takes atorvastatin for hyperlipidemia.  History of CVA.  No dizziness.  No recent chest pains.  No focal weakness.  Past Medical History:  Diagnosis Date   Acute lower GI bleeding 10/11/2011, 12-16-2019   Anemia    BPH (benign prostatic hyperplasia)    Colon polyp    CVA (cerebral vascular accident) ([place]) 12/14/2019   light stroke per pt gait is off a little per pt   DM type 2 (diabetes mellitus, type 2) ([place]) 10/27/2008   Dyspnea    sob with  heavy exertion   History of blood transfusion 12/16/2019   HYPERLIPIDEMIA 04/27/2009   Qualifier: Diagnosis of  By: Elease Hashimoto MD, Sherrilynn Gudgel     HYPERTENSION 10/27/2008   Qualifier: Diagnosis of  By: Valma Cava LPN, Nancy     Intermittent self-catheterization of bladder    daily   Wears dentures    Wears glasses    Past Surgical History:  Procedure Laterality Date   COLONOSCOPY  10/12/2011   Procedure: COLONOSCOPY;  Surgeon: Lafayette Dragon, MD;  Location: [facility] ENDOSCOPY;  Service: Endoscopy;  Laterality: N/A;   COLONOSCOPY WITH PROPOFOL N/A 12/16/2019   Procedure: COLONOSCOPY WITH PROPOFOL;  Surgeon: Ladene Artist, MD;  Location[facility] ENDOSCOPY;  Service: Endoscopy;  Laterality: N/A;   ESOPHAGOGASTRODUODENOSCOPY  10/12/2011   Procedure: ESOPHAGOGASTRODUODENOSCOPY (EGD);  Surgeon: Lafayette Dragon, MD;  Location: [facility] ENDOSCOPY;  Service: Endoscopy;  Laterality: N/A;   ESOPHAGOGASTRODUODENOSCOPY (EGD) WITH  PROPOFOL N/A 12/16/2019   Procedure: ESOPHAGOGASTRODUODENOSCOPY (EGD) WITH PROPOFOL;  Surgeon: Ladene Artist, MD;  Location: [facility] ENDOSCOPY;  Service: Endoscopy;  Laterality: N/A;   ESOPHAGOGASTRODUODENOSCOPY (EGD) WITH PROPOFOL N/A 05/12/2020   Procedure: ESOPHAGOGASTRODUODENOSCOPY (EGD) WITH PROPOFOL;  Surgeon: Milus Banister, MD;  Location: [facility] ENDOSCOPY;  Service: Endoscopy;  Laterality: N/A;   ESOPHAGOGASTRODUODENOSCOPY (EGD) WITH PROPOFOL N/A 10/05/2021   Procedure: ESOPHAGOGASTRODUODENOSCOPY (EGD) WITH PROPOFOL;  Surgeon: Milus Banister, MD;  Location: [facility] ENDOSCOPY;  Service: Gastroenterology;  Laterality: N/A;   EUS N/A 05/12/2020   Procedure: UPPER ENDOSCOPIC ULTRASOUND (EUS) RADIAL;  Surgeon: Milus Banister, MD;  Location: [facility] ENDOSCOPY;  Service: Endoscopy;  Laterality: N/A;   EUS N/A 10/05/2021   Procedure: UPPER ENDOSCOPIC ULTRASOUND (EUS) RADIAL;  Surgeon: Milus Banister, MD;  Location: [facility] ENDOSCOPY;  Service: Gastroenterology;  Laterality: N/A;   EUS N/A 10/05/2021   Procedure: UPPER ENDOSCOPIC ULTRASOUND (EUS) LINEAR;  Surgeon: Milus Banister, MD;  Location: [facility] ENDOSCOPY;  Service: Gastroenterology;  Laterality: N/A;   FINE NEEDLE ASPIRATION N/A 10/05/2021   Procedure: FINE NEEDLE ASPIRATION (FNA) LINEAR;  Surgeon: Milus Banister, MD;  Location: [facility] ENDOSCOPY;  Service: Gastroenterology;  Laterality: N/A;   HEMOSTASIS CLIP PLACEMENT  12/16/2019   Procedure: HEMOSTASIS CLIP PLACEMENT;  Surgeon: Ladene Artist, MD;  Location: [facility] ENDOSCOPY;  Service: Endoscopy;;   HERNIA REPAIR  2007   "navel"   JOINT REPLACEMENT  KNEE CARTILAGE SURGERY  1960's and 1970's   left; "2 total"   POLYPECTOMY  12/16/2019   Procedure: POLYPECTOMY;  Surgeon: Ladene Artist, MD;  Location: [place];  Service: Endoscopy;;   TOTAL HIP ARTHROPLASTY Right 2007   TRANSURETHRAL RESECTION OF PROSTATE N/A 06/27/2020   Procedure: TRANSURETHRAL RESECTION OF THE PROSTATE (TURP);  Surgeon: Lucas Mallow, MD;  Location: [facility];  Service: Urology;  Laterality: N/A;  REQUESTING 55 MINS FOR CASE    reports that he quit smoking about 43 years ago. His smoking use included cigarettes. He has a 4.50 pack-year smoking history. He has never used smokeless tobacco. He reports that he does not currently use alcohol. He reports current drug use. Drug: Marijuana. family history includes Alcohol abuse in an other family member; Arthritis in an other family member; Diabetes in an other family member. No Known Allergies  Review of Systems  Constitutional:  Negative for malaise/fatigue.  Eyes:  Negative for blurred vision.  Respiratory:  Negative for shortness of breath.   Cardiovascular:  Negative for chest pain.  Neurological:  Negative for dizziness, weakness and headaches.      Objective:     BP 126/70 (BP Location: Left Arm, Patient Position: Sitting, Cuff Size: Normal)   Pulse 73   Temp 97.9 F (36.6 C) (Oral)   Ht '6\' 2"'$  (1.88 m)   Wt 235 lb (106.6 kg)   SpO2 99%   BMI 30.17 kg/m    Physical Exam Constitutional:      Appearance: He is well-developed.  HENT:     Right Ear: External ear normal.     Left Ear: External ear normal.  Eyes:     Pupils: Pupils are equal, round, and reactive to light.  Neck:     Thyroid: No thyromegaly.  Cardiovascular:     Rate and Rhythm: Normal rate and regular rhythm.  Pulmonary:     Effort: Pulmonary effort is normal. No respiratory distress.     Breath sounds: Normal breath sounds. No wheezing or rales.  Musculoskeletal:     Cervical back: Neck supple.     Right lower leg: No edema.     Left lower leg: No edema.  Skin:    Comments: Feet reveal no skin lesions. Good distal foot pulses. Good capillary refill.  He does have a small callus under the right second toe volar surface but no ulceration.. Normal sensation with monofilament testing   Neurological:     Mental Status: He is alert and oriented to person, place, and  time.      Results for orders placed or performed in visit on 12/29/21  POCT glycosylated hemoglobin (Hb A1C)  Result Value Ref Range   Hemoglobin A1C 5.9 (A) 4.0 - 5.6 %   HbA1c POC (<> result, manual entry)     HbA1c, POC (prediabetic range)     HbA1c, POC (controlled diabetic range)        The ASCVD Risk score (Arnett DK, et al., 2019) failed to calculate for the following reasons:   The patient has a prior MI or stroke diagnosis    Assessment & Plan:   #1 type 2 diabetes improved with A1c 5.9%.  He will reduce Basaglar to 24 units daily and then further reduce 2 units every 3 days as long as fasting sugars consistently less than 130  #2 hypertension stable and controlled.  Continue current medications  #3 history of CVA and hyperlipidemia.  Continue atorvastatin  20 mg daily.  Needs lipids with next lab draw and 3 to 4 months.  He was nonfasting today.   Return in about 4 months (around 04/30/2022).    Carolann Littler, MD

## 2022-01-07 ENCOUNTER — Other Ambulatory Visit: Payer: Self-pay | Admitting: Family Medicine

## 2022-01-15 ENCOUNTER — Other Ambulatory Visit: Payer: Self-pay | Admitting: Family Medicine

## 2022-02-12 ENCOUNTER — Other Ambulatory Visit: Payer: Self-pay

## 2022-02-12 MED ORDER — LISINOPRIL 20 MG PO TABS: 20.0000 mg | ORAL_TABLET | Freq: Every day | ORAL | 1 refills | Status: DC

## 2022-02-19 ENCOUNTER — Other Ambulatory Visit: Payer: Self-pay | Admitting: Family Medicine

## 2022-02-19 DIAGNOSIS — I10 Essential (primary) hypertension: Secondary | ICD-10-CM

## 2022-02-27 ENCOUNTER — Telehealth: Payer: Self-pay

## 2022-02-27 DIAGNOSIS — D509 Iron deficiency anemia, unspecified: Secondary | ICD-10-CM

## 2022-02-27 NOTE — Telephone Encounter (Signed)
-----   Message from Roetta Sessions, [place] sent at 09/06/2021  9:54 AM EDT ----- Regarding: due for cbc Have patient go to the lab for CBC the week of Nov 20

## 2022-02-27 NOTE — Telephone Encounter (Signed)
Called and left detailed message for patient to go to lab for CBC. Letter mailed to patient as well

## 2022-02-28 ENCOUNTER — Telehealth: Payer: Self-pay

## 2022-02-28 ENCOUNTER — Other Ambulatory Visit (INDEPENDENT_AMBULATORY_CARE_PROVIDER_SITE_OTHER): Payer: Federal, State, Local not specified - PPO

## 2022-02-28 ENCOUNTER — Encounter: Payer: Self-pay | Admitting: Gastroenterology

## 2022-02-28 ENCOUNTER — Other Ambulatory Visit: Payer: Self-pay

## 2022-02-28 DIAGNOSIS — K862 Cyst of pancreas: Secondary | ICD-10-CM

## 2022-02-28 DIAGNOSIS — K869 Disease of pancreas, unspecified: Secondary | ICD-10-CM

## 2022-02-28 DIAGNOSIS — D509 Iron deficiency anemia, unspecified: Secondary | ICD-10-CM

## 2022-02-28 LAB — CBC WITH DIFFERENTIAL/PLATELET
Basophils Absolute: 0 10*3/uL (ref 0.0–0.1)
Basophils Relative: 0.8 % (ref 0.0–3.0)
Eosinophils Absolute: 0.1 10*3/uL (ref 0.0–0.7)
Eosinophils Relative: 2.2 % (ref 0.0–5.0)
HCT: 39.2 % (ref 39.0–52.0)
Hemoglobin: 13.1 g/dL (ref 13.0–17.0)
Lymphocytes Relative: 29 % (ref 12.0–46.0)
Lymphs Abs: 1.7 10*3/uL (ref 0.7–4.0)
MCHC: 33.3 g/dL (ref 30.0–36.0)
MCV: 91.9 fl (ref 78.0–100.0)
Monocytes Absolute: 0.5 10*3/uL (ref 0.1–1.0)
Monocytes Relative: 8.6 % (ref 3.0–12.0)
Neutro Abs: 3.5 10*3/uL (ref 1.4–7.7)
Neutrophils Relative %: 59.4 % (ref 43.0–77.0)
Platelets: 281 10*3/uL (ref 150.0–400.0)
RBC: 4.26 Mil/uL (ref 4.22–5.81)
RDW: 13.6 % (ref 11.5–15.5)
WBC: 5.8 10*3/uL (ref 4.0–10.5)

## 2022-02-28 MED ORDER — DIAZEPAM 5 MG PO TABS: 5.0000 mg | ORAL_TABLET | Freq: Once | ORAL | 0 refills | Status: DC

## 2022-02-28 NOTE — Telephone Encounter (Signed)
See CBC result note from 02-28-22. Yetta Flock, MD  Roetta Sessions, [place] can give him '5mg'$  valium PO x 1 to take 30 minutes prior to MRI. Thanks.   Script faxed to pharmacy 05-01-21

## 2022-03-19 ENCOUNTER — Other Ambulatory Visit: Payer: Self-pay

## 2022-03-19 ENCOUNTER — Other Ambulatory Visit: Payer: Self-pay | Admitting: Family Medicine

## 2022-03-19 DIAGNOSIS — I10 Essential (primary) hypertension: Secondary | ICD-10-CM

## 2022-03-19 MED ORDER — DIAZEPAM 5 MG PO TABS: 5.0000 mg | ORAL_TABLET | Freq: Once | ORAL | 0 refills | Status: AC

## 2022-03-19 NOTE — Telephone Encounter (Signed)
Script reprinted and faxed to [facility]

## 2022-03-19 NOTE — Progress Notes (Unsigned)
Script reprinted and faxed to [facility]

## 2022-03-19 NOTE — Telephone Encounter (Signed)
Patient called stating that the pharmacy has not received prescription for Valium. Requesting it to be resent. Please advise.

## 2022-03-23 ENCOUNTER — Ambulatory Visit (HOSPITAL_COMMUNITY): Payer: Federal, State, Local not specified - PPO

## 2022-03-24 ENCOUNTER — Other Ambulatory Visit: Payer: Self-pay | Admitting: Gastroenterology

## 2022-03-24 ENCOUNTER — Ambulatory Visit (HOSPITAL_COMMUNITY)
Admission: RE | Admit: 2022-03-24 | Discharge: 2022-03-24 | Disposition: A | Discharge status: Home / Self Care | Payer: Federal, State, Local not specified - PPO | Source: Ambulatory Visit | Attending: Gastroenterology | Admitting: Gastroenterology

## 2022-03-24 DIAGNOSIS — K862 Cyst of pancreas: Secondary | ICD-10-CM | POA: Diagnosis present

## 2022-03-24 DIAGNOSIS — K869 Disease of pancreas, unspecified: Secondary | ICD-10-CM | POA: Diagnosis present

## 2022-03-24 MED ORDER — GADOBUTROL 1 MMOL/ML IV SOLN
10.0000 mL | Freq: Once | INTRAVENOUS | Status: AC | PRN
  Administered 2022-03-24: 10 mL via INTRAVENOUS

## 2022-03-28 ENCOUNTER — Other Ambulatory Visit: Payer: Self-pay

## 2022-03-28 ENCOUNTER — Other Ambulatory Visit: Payer: Self-pay | Admitting: Family Medicine

## 2022-03-28 DIAGNOSIS — K869 Disease of pancreas, unspecified: Secondary | ICD-10-CM

## 2022-03-30 ENCOUNTER — Telehealth: Payer: Self-pay | Admitting: Gastroenterology

## 2022-03-30 ENCOUNTER — Other Ambulatory Visit: Payer: Federal, State, Local not specified - PPO

## 2022-04-02 ENCOUNTER — Telehealth: Payer: Self-pay

## 2022-04-02 NOTE — Telephone Encounter (Signed)
-----   Message from Yetta Flock, MD sent at 03/29/2022  5:37 PM EST ----- Regarding: RE: office visit? If he is comfortable going straight to procedure, he has already had one, I think fine to just let him see Dr. Jerilynn Mages for EUS. If he wants to see me beforehand that's fine. If not, can see him after. Thanks  ----- Message ----- From: Roetta Sessions, CMA Sent: 03/29/2022   4:42 PM EST To: Yetta Flock, MD Subject: office visit?                                  Dr. Loni Muse - patient is scheduled for EUS with Dr. Rush Landmark in January.  Does he need to also come in for an OV with you beforehand? Or can we cancel that or move to after his EUS?  He currently has an appointment  with you on 1-2.   Thanks, Jan

## 2022-04-02 NOTE — Telephone Encounter (Signed)
All information was mailed on 03/29/14 . He probably has not had a chance to get it yet.

## 2022-04-02 NOTE — Telephone Encounter (Signed)
Called patient and left message that he is not due for labs at this time. He was due in November and he went on 11-15. He is scheduled for procedure with Dr. Rush Landmark on 1-22

## 2022-04-02 NOTE — Telephone Encounter (Signed)
Called and spoke to patient.  He does not want to keep Jan. 2nd appointment with [facility] since he is already scheduled for EUS with Mansouraty. Appointment cancelled.  Patty - patient  said he has not received information/ instructions for the procedure with Mansouraty on 1-22. Please send to patient. Thank you.

## 2022-04-10 ENCOUNTER — Other Ambulatory Visit: Payer: Self-pay | Admitting: Family Medicine

## 2022-04-17 ENCOUNTER — Ambulatory Visit: Payer: Federal, State, Local not specified - PPO | Admitting: Gastroenterology

## 2022-04-17 ENCOUNTER — Other Ambulatory Visit: Payer: Self-pay | Admitting: Family Medicine

## 2022-04-25 IMAGING — CT CT ENTEROGRAPHY (ABD-PELV W/ CM)
2 of 6 series · 15 of 46 positions shown, 17 images · IV contrast (omnipaque)
Comparison: 03/18/2020 plain film

CLINICAL DATA: Iron deficiency anemia. Lower GI bleeding. Prior
colonoscopy and endoscopy in [REDACTED].

EXAM:
CT ABDOMEN AND PELVIS WITH CONTRAST (ENTEROGRAPHY)
TECHNIQUE: Multidetector CT of the abdomen and pelvis during bolus
administration of intravenous contrast. Negative oral contrast was
given.
CONTRAST:  80mL OMNIPAQUE IOHEXOL 300 MG/ML  SOLN

[Series 4: thins · axial · 0.83mm/px · z∈[-424,-23]mm · 12 of 443 slices shown, 14 images]
[im 21/443  soft-tissue]
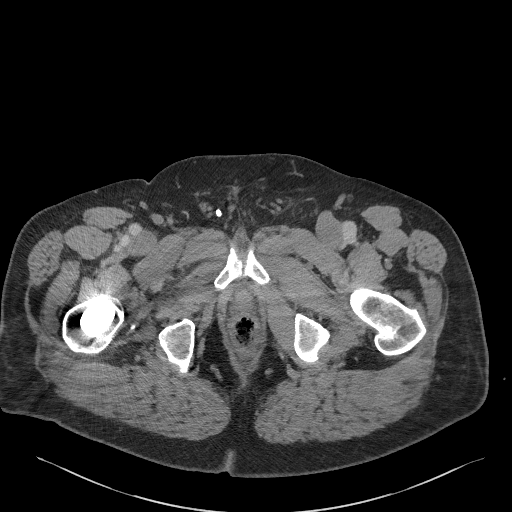
[im 21/443  bone]
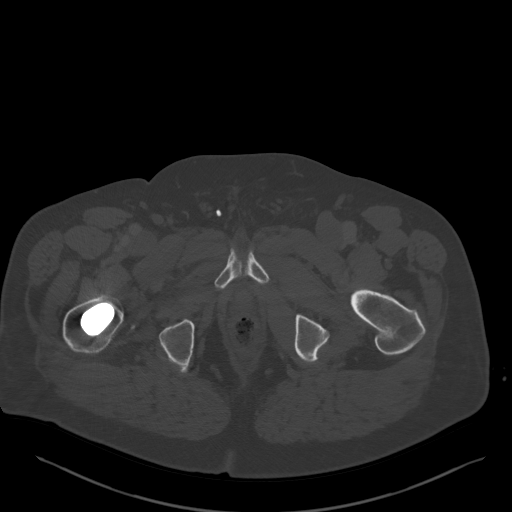
[im 61/443  soft-tissue]
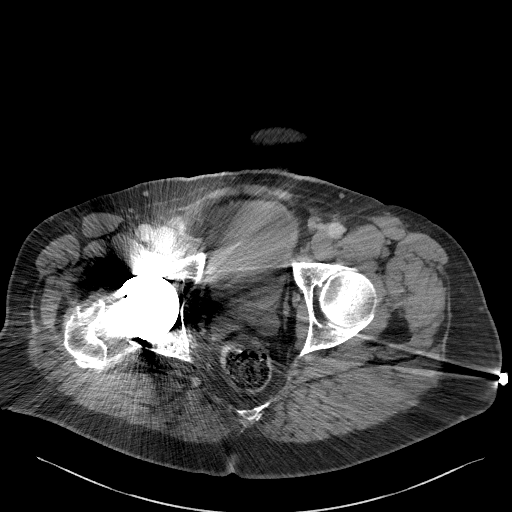
[im 101/443  soft-tissue]
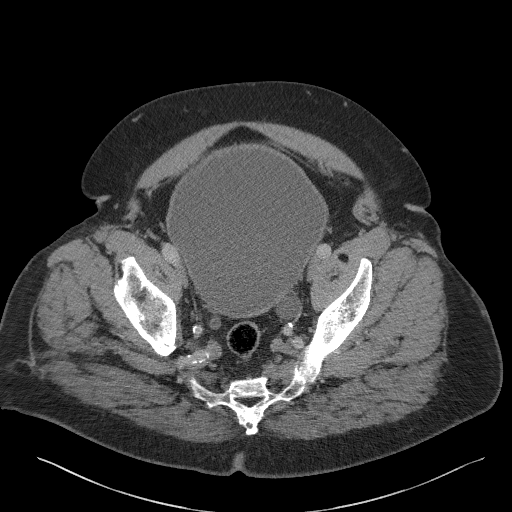
[im 141/443  soft-tissue]
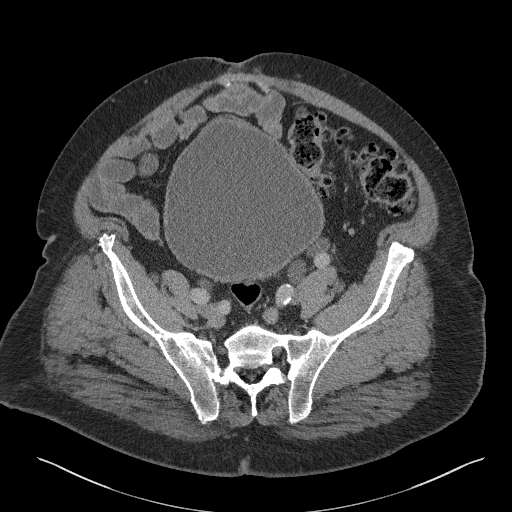
[im 161/443  soft-tissue]
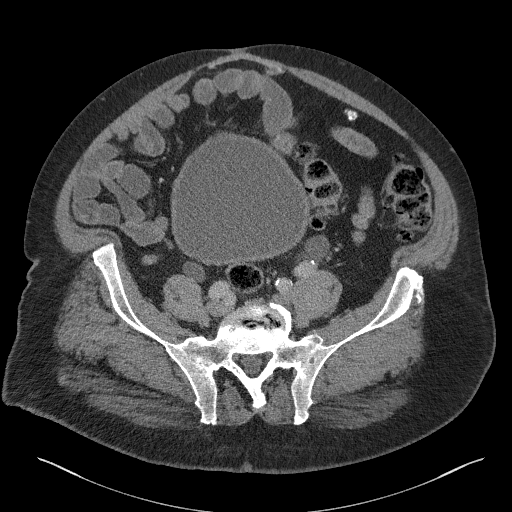
[im 201/443  soft-tissue]
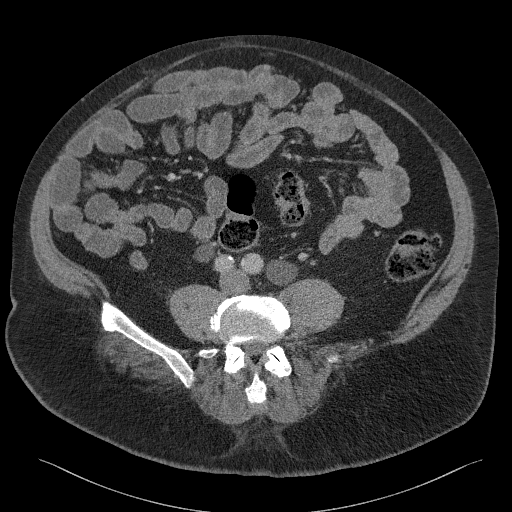
[im 242/443  soft-tissue]
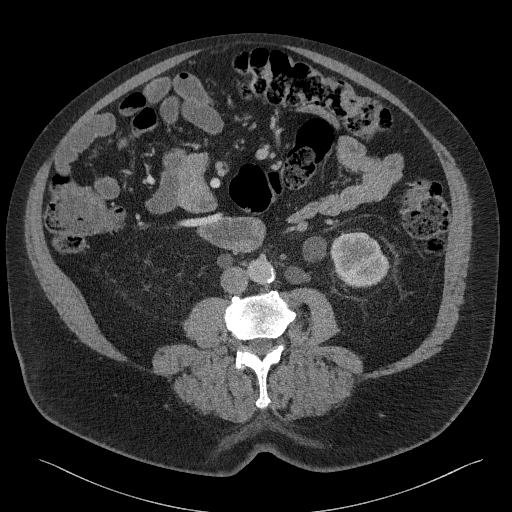
[im 282/443  soft-tissue]
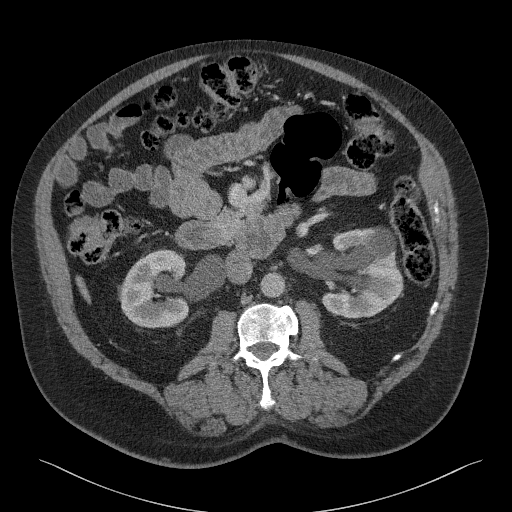
[im 302/443  soft-tissue]
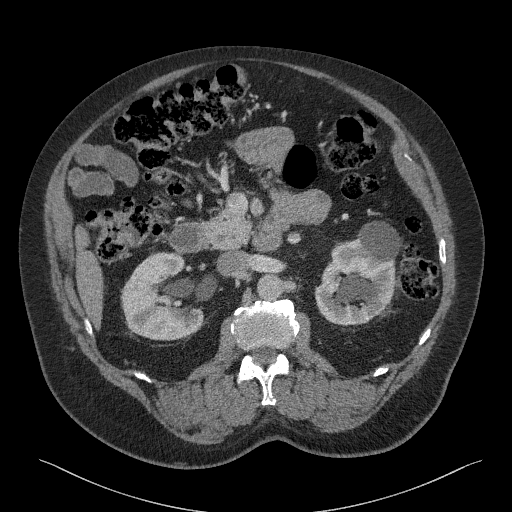
[im 302/443  bone]
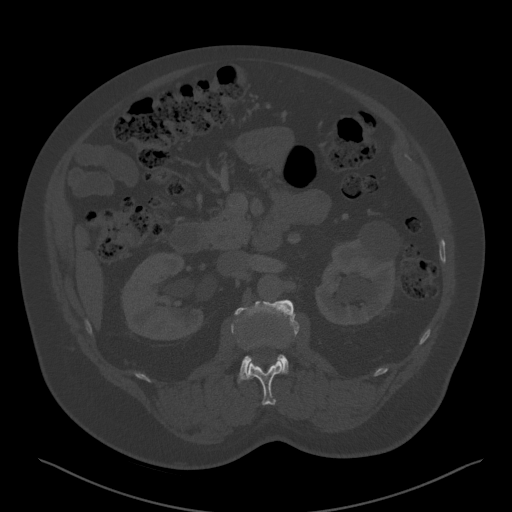
[im 342/443  soft-tissue]
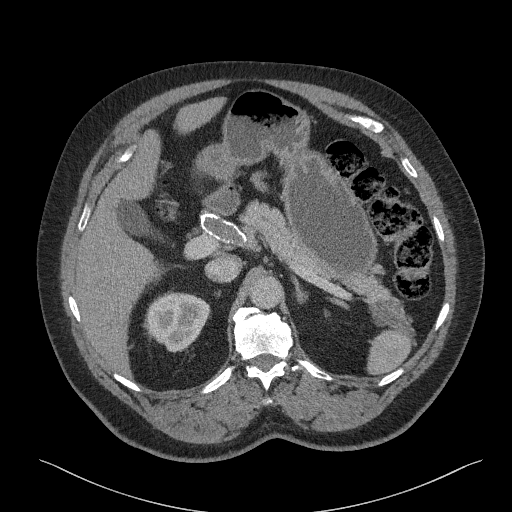
[im 382/443  soft-tissue]
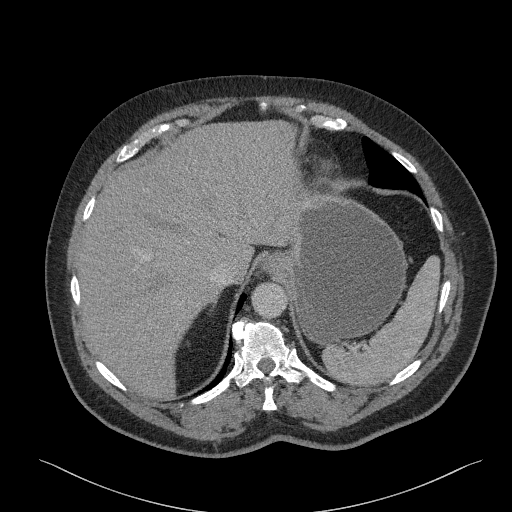
[im 422/443  soft-tissue]
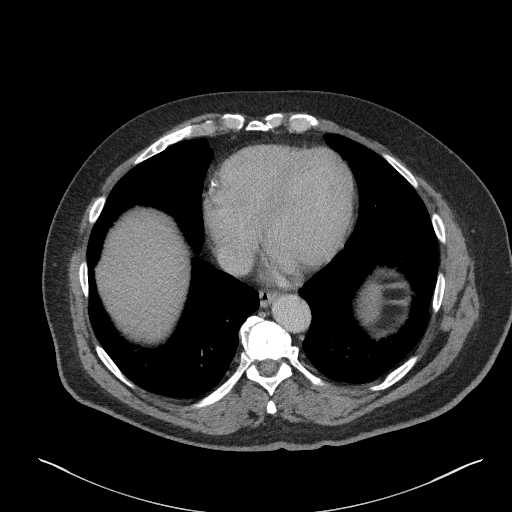

[Series 6: coronal · coronal · 0.84mm/px · 3 of 124 slices shown]
[im 42/124  soft-tissue]
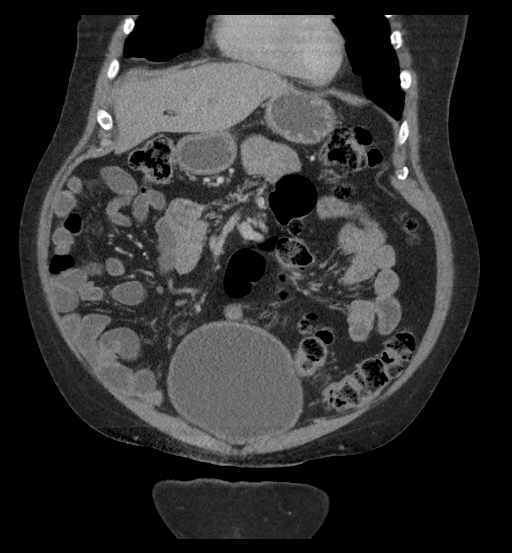
[im 55/124  soft-tissue]
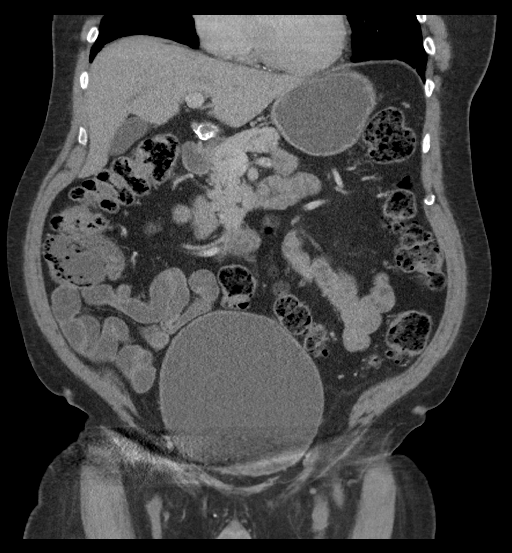
[im 69/124  soft-tissue]
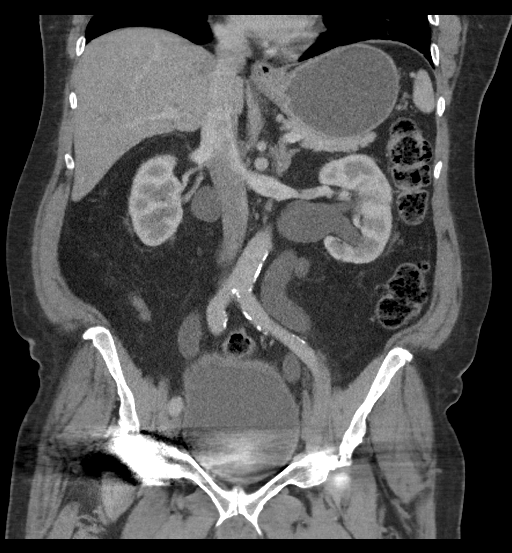

[15 of 46 positions shown; findings below may reference images not displayed]

FINDINGS: Lower chest: Clear lung bases. Normal heart size without pericardial
or pleural effusion. Right coronary artery calcification.

Hepatobiliary: Normal liver. Normal gallbladder, without biliary
ductal dilatation.

Pancreas: Dorsal pancreatic tail hypoattenuating lesion measures
x 2.3 cm on [DATE]. 2.3 cm craniocaudal on coronal image 82.

Spleen: Normal in size, without focal abnormality.

Adrenals/Urinary Tract: Normal adrenal glands. Upper pole left renal
3.3 cm cyst.

Moderate left and mild right-sided hydro nephrosis. Moderate left
greater than right hydroureter. Degraded evaluation of the pelvis,
secondary to beam hardening artifact from right hip arthroplasty.
The bladder is mild-to-moderately distended with impression of the
median lobe prostate within. No other cause for bilateral
hydroureter identified.

Stomach/Bowel: Normal stomach, without wall thickening. Scattered
colonic diverticula. Normal appendix.

The extent of small bowel distension with neutral contrast is
moderate. Normal terminal ileum, including on 199/4.

No small bowel mass, stricture, obstruction, mesenteric abnormality.

Vascular/Lymphatic: Aortic atherosclerosis. Fusiform aneurysm of the
common hepatic artery is calcified, most likely thrombosed including
at 1.4 cm on [DATE]. No abdominal adenopathy. No upper pelvic
adenopathy.

Reproductive: Mild prostatomegaly with median lobe impression into
the urinary bladder.

Other: No significant free fluid. Prior ventral abdominal wall
hernia repair.

Musculoskeletal: Right hip arthroplasty. Degenerative changes of
both sacroiliac joints. Lumbosacral spondylosis including a disc
bulge at the lumbosacral junction.
IMPRESSION: 1. No small bowel pathology to explain anemia.
2. Hypoattenuating pancreatic tail lesion. This could represent a
septated cystic indolent neoplasm. However, adenocarcinoma cannot be
excluded. Potential clinical strategies include more definitive
characterization with pre and post contrast abdominal MRI/MRCP
versus endoscopic ultrasound sampling (if feasible).
3. Fusiform aneurysm of the common hepatic artery, calcified and
likely thrombosed.
4. Left greater than right hydroureteronephrosis with suboptimal
evaluation of the pelvis secondary to right hip arthroplasty.
Favored to be related to bladder distension in the setting of
prostatic enlargement.
5. Coronary artery atherosclerosis. Aortic Atherosclerosis
([id number]).

These results will be called to the ordering clinician or
representative by the Radiologist Assistant, and communication
documented in the PACS or [REDACTED].

## 2022-04-30 ENCOUNTER — Encounter: Payer: Self-pay | Admitting: Family Medicine

## 2022-04-30 ENCOUNTER — Ambulatory Visit: Payer: Federal, State, Local not specified - PPO | Admitting: Family Medicine

## 2022-04-30 ENCOUNTER — Encounter (HOSPITAL_COMMUNITY): Payer: Self-pay | Admitting: Gastroenterology

## 2022-04-30 VITALS — BP 130/78 | HR 72 | Temp 97.9°F | Ht 74.0 in | Wt 236.2 lb

## 2022-04-30 DIAGNOSIS — Z794 Long term (current) use of insulin: Secondary | ICD-10-CM

## 2022-04-30 DIAGNOSIS — I10 Essential (primary) hypertension: Secondary | ICD-10-CM | POA: Diagnosis not present

## 2022-04-30 DIAGNOSIS — E785 Hyperlipidemia, unspecified: Secondary | ICD-10-CM

## 2022-04-30 DIAGNOSIS — E1165 Type 2 diabetes mellitus with hyperglycemia: Secondary | ICD-10-CM

## 2022-04-30 LAB — COMPREHENSIVE METABOLIC PANEL
ALT: 9 U/L (ref 0–53)
AST: 12 U/L (ref 0–37)
Albumin: 4.3 g/dL (ref 3.5–5.2)
Alkaline Phosphatase: 53 U/L (ref 39–117)
BUN: 26 mg/dL — ABNORMAL HIGH (ref 6–23)
CO2: 26 mEq/L (ref 19–32)
Calcium: 9.6 mg/dL (ref 8.4–10.5)
Chloride: 107 mEq/L (ref 96–112)
Creatinine, Ser: 1.71 mg/dL — ABNORMAL HIGH (ref 0.40–1.50)
GFR: 38.57 mL/min — ABNORMAL LOW (ref >60.00)
Glucose, Bld: 114 mg/dL — ABNORMAL HIGH (ref 70–99)
Potassium: 4.7 mEq/L (ref 3.5–5.1)
Sodium: 142 mEq/L (ref 135–145)
Total Bilirubin: 0.7 mg/dL (ref 0.2–1.2)
Total Protein: 7.4 g/dL (ref 6.0–8.3)

## 2022-04-30 LAB — LIPID PANEL
Cholesterol: 124 mg/dL (ref 0–200)
HDL: 38 mg/dL — ABNORMAL LOW (ref >39.00)
LDL Cholesterol: 72 mg/dL (ref 0–99)
NonHDL: 85.98
Total CHOL/HDL Ratio: 3
Triglycerides: 68 mg/dL (ref 0.0–149.0)
VLDL: 13.6 mg/dL (ref 0.0–40.0)

## 2022-04-30 LAB — POCT GLYCOSYLATED HEMOGLOBIN (HGB A1C): Hemoglobin A1C: 6.9 % — AB (ref 4.0–5.6)

## 2022-04-30 NOTE — Progress Notes (Signed)
Established Patient Office Visit  Subjective   Patient ID: Carl Lam, male    DOB: 1945/05/23  Age: 77 y.o. MRN: [id number]  No chief complaint on file.   HPI   [name] is seen for medical follow-up.  His last A1c was 5.9% and we had him drop his Basaglar insulin back a bit.  He is currently 24 units daily in addition to his metformin.  Fasting blood sugars mostly low 100s.  He states that he did eat poorly over the holidays with more high glycemic foods than usual.  He has hypertension treated with lisinopril, chlorthalidone, and amlodipine.  Denies any recent dizziness.  No headaches.  No chest pains.  He has hyperlipidemia treated with atorvastatin 20 mg daily.  Is being followed by GI for complex lesion tail the pancreas with mixed cystic and solid components.  Appetite and weight stable.  No abdominal pain.  Past Medical History:  Diagnosis Date   Acute lower GI bleeding 10/11/2011, 12-16-2019   Anemia    BPH (benign prostatic hyperplasia)    Colon polyp    CVA (cerebral vascular accident) ([place]) 12/14/2019   light stroke per pt gait is off a little per pt   DM type 2 (diabetes mellitus, type 2) ([place]) 10/27/2008   Dyspnea    sob with  heavy exertion   History of blood transfusion 12/16/2019   HYPERLIPIDEMIA 04/27/2009   Qualifier: Diagnosis of  By: Elease Hashimoto MD, Lekha Dancer     HYPERTENSION 10/27/2008   Qualifier: Diagnosis of  By: Valma Cava LPN, Nancy     Intermittent self-catheterization of bladder    daily   Wears dentures    Wears glasses    Past Surgical History:  Procedure Laterality Date   COLONOSCOPY  10/12/2011   Procedure: COLONOSCOPY;  Surgeon: Lafayette Dragon, MD;  Location: [facility] ENDOSCOPY;  Service: Endoscopy;  Laterality: N/A;   COLONOSCOPY WITH PROPOFOL N/A 12/16/2019   Procedure: COLONOSCOPY WITH PROPOFOL;  Surgeon: Ladene Artist, MD;  Location: [facility] ENDOSCOPY;  Service: Endoscopy;  Laterality: N/A;   ESOPHAGOGASTRODUODENOSCOPY  10/12/2011   Procedure:  ESOPHAGOGASTRODUODENOSCOPY (EGD);  Surgeon: Lafayette Dragon, MD;  Location: [facility] ENDOSCOPY;  Service: Endoscopy;  Laterality: N/A;   ESOPHAGOGASTRODUODENOSCOPY (EGD) WITH PROPOFOL N/A 12/16/2019   Procedure: ESOPHAGOGASTRODUODENOSCOPY (EGD) WITH PROPOFOL;  Surgeon: Ladene Artist, MD;  Location: [facility] ENDOSCOPY;  Service: Endoscopy;  Laterality: N/A;   ESOPHAGOGASTRODUODENOSCOPY (EGD) WITH PROPOFOL N/A 05/12/2020   Procedure: ESOPHAGOGASTRODUODENOSCOPY (EGD) WITH PROPOFOL;  Surgeon: Milus Banister, MD;  Location: [facility] ENDOSCOPY;  Service: Endoscopy;  Laterality: N/A;   ESOPHAGOGASTRODUODENOSCOPY (EGD) WITH PROPOFOL N/A 10/05/2021   Procedure: ESOPHAGOGASTRODUODENOSCOPY (EGD) WITH PROPOFOL;  Surgeon: Milus Banister, MD;  Location: [facility] ENDOSCOPY;  Service: Gastroenterology;  Laterality: N/A;   EUS N/A 05/12/2020   Procedure: UPPER ENDOSCOPIC ULTRASOUND (EUS) RADIAL;  Surgeon: Milus Banister, MD;  Location: [facility] ENDOSCOPY;  Service: Endoscopy;  Laterality: N/A;   EUS N/A 10/05/2021   Procedure: UPPER ENDOSCOPIC ULTRASOUND (EUS) RADIAL;  Surgeon: Milus Banister, MD;  Location: [facility] ENDOSCOPY;  Service: Gastroenterology;  Laterality: N/A;   EUS N/A 10/05/2021   Procedure: UPPER ENDOSCOPIC ULTRASOUND (EUS) LINEAR;  Surgeon: Milus Banister, MD;  Location: [facility] ENDOSCOPY;  Service: Gastroenterology;  Laterality: N/A;   FINE NEEDLE ASPIRATION N/A 10/05/2021   Procedure: FINE NEEDLE ASPIRATION (FNA) LINEAR;  Surgeon: Milus Banister, MD;  Location: [facility] ENDOSCOPY;  Service: Gastroenterology;  Laterality: N/A;   HEMOSTASIS CLIP PLACEMENT  12/16/2019  Procedure: HEMOSTASIS CLIP PLACEMENT;  Surgeon: Ladene Artist, MD;  Location: [facility] ENDOSCOPY;  Service: Endoscopy;;   HERNIA REPAIR  2007   "navel"   JOINT REPLACEMENT     KNEE CARTILAGE SURGERY  1960's and 1970's   left; "2 total"   POLYPECTOMY  12/16/2019   Procedure: POLYPECTOMY;  Surgeon: Ladene Artist, MD;  Location: [place];  Service: Endoscopy;;   TOTAL HIP  ARTHROPLASTY Right 2007   TRANSURETHRAL RESECTION OF PROSTATE N/A 06/27/2020   Procedure: TRANSURETHRAL RESECTION OF THE PROSTATE (TURP);  Surgeon: Lucas Mallow, MD;  Location: [facility];  Service: Urology;  Laterality: N/A;  REQUESTING 62 MINS FOR CASE    reports that he quit smoking about 43 years ago. His smoking use included cigarettes. He has a 4.50 pack-year smoking history. He has never used smokeless tobacco. He reports that he does not currently use alcohol. He reports current drug use. Drug: Marijuana. family history includes Alcohol abuse in an other family member; Arthritis in an other family member; Diabetes in an other family member. No Known Allergies  Review of Systems  Constitutional:  Negative for malaise/fatigue.  Eyes:  Negative for blurred vision.  Respiratory:  Negative for shortness of breath.   Cardiovascular:  Negative for chest pain.  Neurological:  Negative for dizziness, weakness and headaches.      Objective:     BP 130/78 (BP Location: Left Arm, Patient Position: Sitting, Cuff Size: Normal)   Pulse 72   Temp 97.9 F (36.6 C) (Oral)   Ht '6\' 2"'$  (1.88 m)   Wt 236 lb 3.2 oz (107.1 kg)   SpO2 99%   BMI 30.33 kg/m  BP Readings from Last 3 Encounters:  04/30/22 130/78  12/29/21 126/70  10/05/21 124/76   Wt Readings from Last 3 Encounters:  04/30/22 236 lb 3.2 oz (107.1 kg)  12/29/21 235 lb (106.6 kg)  10/05/21 238 lb (108 kg)      Physical Exam Vitals reviewed.  Constitutional:      Appearance: He is well-developed.  HENT:     Right Ear: External ear normal.     Left Ear: External ear normal.  Eyes:     Pupils: Pupils are equal, round, and reactive to light.  Neck:     Thyroid: No thyromegaly.  Cardiovascular:     Rate and Rhythm: Normal rate and regular rhythm.  Pulmonary:     Effort: Pulmonary effort is normal. No respiratory distress.     Breath sounds: Normal breath sounds. No wheezing or rales.   Musculoskeletal:     Cervical back: Neck supple.     Right lower leg: No edema.     Left lower leg: No edema.  Neurological:     Mental Status: He is alert and oriented to person, place, and time.      Results for orders placed or performed in visit on 04/30/22  POCT glycosylated hemoglobin (Hb A1C)  Result Value Ref Range   Hemoglobin A1C 6.9 (A) 4.0 - 5.6 %   HbA1c POC (<> result, manual entry)     HbA1c, POC (prediabetic range)     HbA1c, POC (controlled diabetic range)        The ASCVD Risk score (Arnett DK, et al., 2019) failed to calculate for the following reasons:   The patient has a prior MI or stroke diagnosis    Assessment & Plan:   Problem List Items Addressed This Visit  Unprioritized   Type 2 diabetes mellitus with hyperglycemia (HCC) - Primary   Relevant Orders   POCT glycosylated hemoglobin (Hb A1C) (Completed)   Essential hypertension   Relevant Orders   CMP   Hyperlipidemia   Relevant Orders   Lipid panel  Type 2 diabetes with increase in A1c to 6.9% but still fairly well-controlled.  Tighten up diet and recheck in 4 months  -Check labs above with CMP and lipid panel  -Continue yearly diabetic eye exam  Return in about 3 months (around 07/30/2022).    Carolann Littler, MD

## 2022-05-07 ENCOUNTER — Encounter (HOSPITAL_COMMUNITY)
Admission: RE | Disposition: A | Discharge status: Home / Self Care | Payer: Self-pay | Source: Ambulatory Visit | Attending: Gastroenterology

## 2022-05-07 ENCOUNTER — Ambulatory Visit (HOSPITAL_COMMUNITY): Payer: Federal, State, Local not specified - PPO | Admitting: Anesthesiology

## 2022-05-07 ENCOUNTER — Other Ambulatory Visit: Payer: Self-pay

## 2022-05-07 ENCOUNTER — Encounter (HOSPITAL_COMMUNITY): Payer: Self-pay | Admitting: Gastroenterology

## 2022-05-07 ENCOUNTER — Ambulatory Visit (HOSPITAL_COMMUNITY)
Admission: RE | Admit: 2022-05-07 | Discharge: 2022-05-07 | Disposition: A | Discharge status: Home / Self Care | Payer: Federal, State, Local not specified - PPO | Source: Ambulatory Visit | Attending: Gastroenterology | Admitting: Gastroenterology

## 2022-05-07 DIAGNOSIS — K862 Cyst of pancreas: Secondary | ICD-10-CM | POA: Diagnosis not present

## 2022-05-07 DIAGNOSIS — K2970 Gastritis, unspecified, without bleeding: Secondary | ICD-10-CM | POA: Insufficient documentation

## 2022-05-07 DIAGNOSIS — E119 Type 2 diabetes mellitus without complications: Secondary | ICD-10-CM | POA: Diagnosis not present

## 2022-05-07 DIAGNOSIS — K2289 Other specified disease of esophagus: Secondary | ICD-10-CM | POA: Insufficient documentation

## 2022-05-07 DIAGNOSIS — E669 Obesity, unspecified: Secondary | ICD-10-CM | POA: Diagnosis not present

## 2022-05-07 DIAGNOSIS — Z794 Long term (current) use of insulin: Secondary | ICD-10-CM | POA: Diagnosis not present

## 2022-05-07 DIAGNOSIS — K209 Esophagitis, unspecified without bleeding: Secondary | ICD-10-CM | POA: Diagnosis not present

## 2022-05-07 DIAGNOSIS — Z8673 Personal history of transient ischemic attack (TIA), and cerebral infarction without residual deficits: Secondary | ICD-10-CM | POA: Insufficient documentation

## 2022-05-07 DIAGNOSIS — K449 Diaphragmatic hernia without obstruction or gangrene: Secondary | ICD-10-CM | POA: Insufficient documentation

## 2022-05-07 DIAGNOSIS — Z7984 Long term (current) use of oral hypoglycemic drugs: Secondary | ICD-10-CM | POA: Insufficient documentation

## 2022-05-07 DIAGNOSIS — I10 Essential (primary) hypertension: Secondary | ICD-10-CM | POA: Insufficient documentation

## 2022-05-07 DIAGNOSIS — K3189 Other diseases of stomach and duodenum: Secondary | ICD-10-CM | POA: Diagnosis not present

## 2022-05-07 DIAGNOSIS — Z87891 Personal history of nicotine dependence: Secondary | ICD-10-CM | POA: Insufficient documentation

## 2022-05-07 DIAGNOSIS — Z6830 Body mass index (BMI) 30.0-30.9, adult: Secondary | ICD-10-CM | POA: Insufficient documentation

## 2022-05-07 DIAGNOSIS — K869 Disease of pancreas, unspecified: Secondary | ICD-10-CM

## 2022-05-07 HISTORY — PX: BIOPSY: SHX5522

## 2022-05-07 HISTORY — PX: EUS: SHX5427

## 2022-05-07 HISTORY — PX: ESOPHAGOGASTRODUODENOSCOPY (EGD) WITH PROPOFOL: SHX5813

## 2022-05-07 HISTORY — PX: FINE NEEDLE ASPIRATION: SHX5430

## 2022-05-07 LAB — GLUCOSE, CAPILLARY: Glucose-Capillary: 87 mg/dL (ref 70–99)

## 2022-05-07 SURGERY — ESOPHAGOGASTRODUODENOSCOPY (EGD) WITH PROPOFOL
Anesthesia: Monitor Anesthesia Care

## 2022-05-07 MED ORDER — CIPROFLOXACIN IN D5W 400 MG/200ML IV SOLN
400.0000 mg | Freq: Once | INTRAVENOUS | Status: AC
  Administered 2022-05-07: 400 mg via INTRAVENOUS

## 2022-05-07 MED ORDER — SODIUM CHLORIDE 0.9 % IV SOLN: INTRAVENOUS | Status: DC

## 2022-05-07 MED ORDER — PROPOFOL 10 MG/ML IV BOLUS
INTRAVENOUS | Status: AC
  Filled 2022-05-07: qty 20

## 2022-05-07 MED ORDER — LACTATED RINGERS IV SOLN
INTRAVENOUS | Status: AC | PRN
  Administered 2022-05-07: 10 mL/h via INTRAVENOUS

## 2022-05-07 MED ORDER — PROPOFOL 500 MG/50ML IV EMUL
INTRAVENOUS | Status: AC
  Filled 2022-05-07: qty 50

## 2022-05-07 MED ORDER — OMEPRAZOLE 20 MG PO CPDR: 20.0000 mg | DELAYED_RELEASE_CAPSULE | Freq: Every day | ORAL | 6 refills | Status: AC

## 2022-05-07 MED ORDER — PROPOFOL 10 MG/ML IV BOLUS
INTRAVENOUS | Status: DC | PRN
  Administered 2022-05-07: 20 mg via INTRAVENOUS
  Administered 2022-05-07: 30 mg via INTRAVENOUS
  Administered 2022-05-07: 20 mg via INTRAVENOUS

## 2022-05-07 MED ORDER — CIPROFLOXACIN IN D5W 400 MG/200ML IV SOLN
INTRAVENOUS | Status: AC
  Filled 2022-05-07: qty 200

## 2022-05-07 MED ORDER — PROPOFOL 500 MG/50ML IV EMUL
INTRAVENOUS | Status: DC | PRN
  Administered 2022-05-07: 125 ug/kg/min via INTRAVENOUS

## 2022-05-07 MED ORDER — CIPROFLOXACIN HCL 500 MG PO TABS: 500.0000 mg | ORAL_TABLET | Freq: Two times a day (BID) | ORAL | 0 refills | Status: AC

## 2022-05-07 NOTE — Anesthesia Preprocedure Evaluation (Signed)
Anesthesia Evaluation  Patient identified by MRN, date of birth, ID band Patient awake    Reviewed: Allergy & Precautions, NPO status , Patient's Chart, lab work & pertinent test results  Airway Mallampati: II  TM Distance: >3 FB Neck ROM: Full    Dental  (+) Dental Advisory Given, Edentulous Lower, Edentulous Upper   Pulmonary neg pulmonary ROS, former smoker   Pulmonary exam normal breath sounds clear to auscultation       Cardiovascular hypertension, Pt. on medications Normal cardiovascular exam Rhythm:Regular Rate:Normal     Neuro/Psych CVA, Residual Symptoms  negative psych ROS   GI/Hepatic negative GI ROS,,,Pancreatic lesion   Endo/Other  diabetes, Type 2, Oral Hypoglycemic Agents, Insulin Dependent  Obesity   Renal/GU Renal InsufficiencyRenal disease Bladder dysfunction      Musculoskeletal  (+) Arthritis ,    Abdominal   Peds  Hematology negative hematology ROS (+)   Anesthesia Other Findings Day of surgery medications reviewed with the patient.  Reproductive/Obstetrics                             Anesthesia Physical Anesthesia Plan  ASA: 3  Anesthesia Plan: MAC   Post-op Pain Management:    Induction: Intravenous  PONV Risk Score and Plan: 1 and Treatment may vary due to age or medical condition and Propofol infusion  Airway Management Planned: Natural Airway and Nasal Cannula  Additional Equipment:   Intra-op Plan:   Post-operative Plan:   Informed Consent: I have reviewed the patients History and Physical, chart, labs and discussed the procedure including the risks, benefits and alternatives for the proposed anesthesia with the patient or authorized representative who has indicated his/her understanding and acceptance.     Dental advisory given  Plan Discussed with: CRNA  Anesthesia Plan Comments:         Anesthesia Quick Evaluation

## 2022-05-07 NOTE — Transfer of Care (Signed)
Immediate Anesthesia Transfer of Care Note  Patient: Carl Lam  Procedure(s) Performed: UPPER ENDOSCOPIC ULTRASOUND (EUS) RADIAL ESOPHAGOGASTRODUODENOSCOPY (EGD) WITH PROPOFOL BIOPSY FINE NEEDLE ASPIRATION (FNA) LINEAR  Patient Location: PACU and Endoscopy Unit  Anesthesia Type:MAC  Level of Consciousness: awake  Airway & Oxygen Therapy: Patient Spontanous Breathing  Post-op Assessment: Report given to RN and Post -op Vital signs reviewed and stable  Post vital signs: Reviewed and stable  Last Vitals:  Vitals Value Taken Time  BP 87/50 05/07/22 1320  Temp    Pulse 73 05/07/22 1321  Resp 19 05/07/22 1321  SpO2 96 % 05/07/22 1321  Vitals shown include unvalidated device data.  Last Pain:  Vitals:   05/07/22 1108  TempSrc: Temporal  PainSc: 0-No pain         Complications: No notable events documented.

## 2022-05-07 NOTE — Anesthesia Procedure Notes (Signed)
Procedure Name: MAC Date/Time: 05/07/2022 12:15 PM  Performed by: Lollie Sails, CRNAPre-anesthesia Checklist: Patient identified, Emergency Drugs available, Suction available, Patient being monitored and Timeout performed Oxygen Delivery Method: Simple face mask Preoxygenation: POM used. Placement Confirmation: positive ETCO2

## 2022-05-07 NOTE — Op Note (Signed)
[facility] Patient Name: Carl Lam Procedure Date: 05/07/2022 MRN: [id number] Attending MD: Justice Britain , MD, [id number] Date of Birth: 09/25/1945 CSN: [id number] Age: 77 Admit Type: Outpatient Procedure:                Upper EUS Indications:              Pancreatic cyst on CT scan Providers:                Justice Britain, MD, Carlyn Reichert, RN, Jaci Carrel, RN, Cherylynn Ridges, Technician, William Dalton, Technician Referring MD:             Milus Banister, MD, Eulas Post, Carlota Raspberry.                            Armbruster, MD Medicines:                Monitored Anesthesia Care, Cipro 761 mg IV Complications:            No immediate complications. Estimated Blood Loss:     Estimated blood loss was minimal. Procedure:                Pre-Anesthesia Assessment:                           - Prior to the procedure, a History and Physical                            was performed, and patient medications and                            allergies were reviewed. The patient's tolerance of                            previous anesthesia was also reviewed. The risks                            and benefits of the procedure and the sedation                            options and risks were discussed with the patient.                            All questions were answered, and informed consent                            was obtained. Prior Anticoagulants: The patient has                            taken no anticoagulant or antiplatelet agents. ASA  Grade Assessment: III - A patient with severe                            systemic disease. After reviewing the risks and                            benefits, the patient was deemed in satisfactory                            condition to undergo the procedure.                           After obtaining informed consent, the endoscope was                             passed under direct vision. Throughout the                            procedure, the patient's blood pressure, pulse, and                            oxygen saturations were monitored continuously. The                            [id number]-H190 ([id number]) Olympus endoscope was introduced                            through the mouth, and advanced to the second part                            of duodenum. The [id number]-Q190V ([id number]) Olympus                            duodenoscope was introduced through the mouth, and                            advanced to the area of papilla. The [id number]                            ([id number]) Olympus linear ultrasound scope was                            introduced through the mouth, and advanced to the                            duodenum for ultrasound examination from the                            stomach and duodenum. The upper EUS was                            accomplished without difficulty. The patient  tolerated the procedure. Scope In: Scope Out: Findings:      ENDOSCOPIC FINDING: :      No gross lesions were noted in the majority of the esophagus.      LA Grade A (one or more mucosal breaks less than 5 mm, not extending       between tops of 2 mucosal folds) esophagitis with no bleeding was found       in the very distal esophagus.      The Z-line was irregular and was found 35 cm from the incisors.      A 3 cm hiatal hernia was present.      Localized moderately erythematous mucosa without bleeding was found on       the greater curvature of the gastric body.      No other gross lesions were noted in the entire examined stomach.       Biopsies were taken with a cold forceps for histology and Helicobacter       pylori testing.      No gross lesions were noted in the duodenal bulb, in the first portion       of the duodenum and in the second portion of the duodenum.      The major papilla was normal  (hidden under a hood).      ENDOSONOGRAPHIC FINDING: :      An irregular lesion was identified in the pancreatic tail. The lesion       was mixed cystic with some solid components. The lesion measured 30 mm       by 31 mm in maximal cross-sectional diameter (the largest cystic       component was 1 12 mm x 11 mm). The outer margins were irregular. There       was sonographic evidence suggesting abutment of the splenic artery. An       intact interface was seen between the superior mesenteric artery and       celiac trunk suggesting a lack of invasion. The remainder of the       pancreas was examined. The endosonographic appearance of parenchyma       showed evidence of hyperechoic strands throughout but no ductal or       pancreatic calcifications, however the pancreatic duct indicated no duct       dilation other than some hyperechoic ectatic duct (PD head = 2.4 mm, PD       neck = 2.0 mm, PD body = 1.4 mm, PD tail = 1.5 mm), as well within the       pancreatic head there was a 6 mm x 5 mm cyst. Diagnostic needle       aspiration for fluid was performed of the largest cystic area. Color       Doppler imaging was utilized prior to needle puncture to confirm a lack       of significant vascular structures within the needle path. One pass was       made with the [place] expect 22 gauge needle using a       transgastric approach. The amount of fluid collected was 0.5 mL. The       fluid was opaque, serosanguinous and watery. Sample sent for amylase       concentration, glucose concentration, cytology and CEA. I then proceeded       with fine needle aspiration for attempt at further cytology of the mixed  lesion. Color Doppler imaging was utilized prior to needle puncture to       confirm a lack of significant vascular structures within the needle       path. Four passes were made with the same [place] expect 22       gauge needle using a transgastric approach. A stylet  was used. A       cytotechnologist was present to evaluate the adequacy of the specimen.       Final cytology results are pending.      There was no sign of significant endosonographic abnormality in the       common bile duct and in the common hepatic duct. An unremarkable       gallbladder was identified.      Endosonographic imaging of the ampulla showed no intramural       (subepithelial) lesion.      No malignant-appearing lymph nodes were visualized in the celiac region       (level 20), peripancreatic region and porta hepatis region.      Endosonographic imaging in the visualized portion of the liver showed no       mass.      The celiac region was visualized. Impression:               EGD impression:                           - No gross lesions in the majority of esophagus. LA                            Grade A esophagitis with no bleeding in the very                            distal esophagus.                           - Z-line irregular, 35 cm from the incisors.                           - 3 cm hiatal hernia.                           - Erythematous mucosa in the greater curvature of                            the gastric body. No other gross lesions in the                            entire stomach. Biopsied.                           - No gross lesions in the duodenal bulb, in the                            first portion of the duodenum and in the second                            portion of the duodenum.                           -  Normal major papilla (hidden under a hood).                           EUS impression:                           - A mixed cystic/solid lesion was identified in the                            pancreatic tail. Cytology results are pending.                            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 Fine needle aspiration                            for fluid performed of the largest cystic                            component. Fine needle aspiration performed of the                            solid component.                           - Small cystic lesion in the head of pancreas and                            otherwise nondilated duct with findings of                            hyperechoic strands and ectatic duct.                           - There was no sign of significant pathology in the                            common bile duct and in the common hepatic duct.                           - No malignant-appearing lymph nodes were  visualized in the celiac region (level 20),                            peripancreatic region and porta hepatis region. Moderate Sedation:      Not Applicable - Patient had care per Anesthesia. Recommendation:           - The patient will be observed post-procedure,                            until all discharge criteria are met.                           - Discharge patient to home.                           - Patient has a contact number available for                            emergencies. The signs and symptoms of potential                            delayed complications were discussed with the                            patient. Return to normal activities tomorrow.                            Written discharge instructions were provided to the                            patient.                           - Low fat diet.                           - Observe patient's clinical course.                           - Await cytology results and await path results.                           - Start omeprazole 20 mg daily (take 30 minutes                            before breakfast or dinner) to  heal esophagus                            inflammation.                           - Ciprofloxacin 500 mg twice daily x 3 days to                            decrease postinfectious EUS complications.                           -  Continue present medications.                           - Follow-up to be dictated based on results of                            fluid analysis. Considering discussion at                            multidisciplinary conference as well as further                            imaging/follow-up imaging.                           - The findings and recommendations were discussed                            with the patient.                           - The findings and recommendations were discussed                            with the patient's family. Procedure Code(s):        --- Professional ---                           [phone], Esophagogastroduodenoscopy, flexible,                            transoral; with transendoscopic ultrasound-guided                            intramural or transmural fine needle                            aspiration/biopsy(s), (includes endoscopic                            ultrasound examination limited to the esophagus,                            stomach or duodenum, and adjacent structures)                           43239, 59, Esophagogastroduodenoscopy, flexible,                            transoral; with biopsy, single or multiple Diagnosis Code(s):        --- Professional ---                           K20.90, Esophagitis, unspecified without bleeding                           K22.89, Other specified disease of esophagus  K44.9, Diaphragmatic hernia without obstruction or                            gangrene                           K31.89, Other diseases of stomach and duodenum                           K86.89, Other specified diseases of pancreas                           I89.9, Noninfective disorder of lymphatic  vessels                            and lymph nodes, unspecified                           K86.2, Cyst of pancreas CPT copyright 2022 American [employer] Association. All rights reserved. The codes documented in this report are preliminary and upon coder review may  be revised to meet current compliance requirements. Justice Britain, MD 05/07/2022 1:39:27 PM Number of Addenda: 0

## 2022-05-07 NOTE — Discharge Instructions (Signed)
YOU HAD AN ENDOSCOPIC PROCEDURE TODAY: Refer to the procedure report and other information in the discharge instructions given to you for any specific questions about what was found during the examination. If this information does not answer your questions, please call [place] office at [phone] to clarify.  ° °YOU SHOULD EXPECT: Some feelings of bloating in the abdomen. Passage of more gas than usual. Walking can help get rid of the air that was put into your GI tract during the procedure and reduce the bloating. If you had a lower endoscopy (such as a colonoscopy or flexible sigmoidoscopy) you may notice spotting of blood in your stool or on the toilet paper. Some abdominal soreness may be present for a day or two, also. ° °DIET: Your first meal following the procedure should be a light meal and then it is ok to progress to your normal diet. A half-sandwich or bowl of soup is an example of a good first meal. Heavy or fried foods are harder to digest and may make you feel nauseous or bloated. Drink plenty of fluids but you should avoid alcoholic beverages for 24 hours. If you had a esophageal dilation, please see attached instructions for diet.   ° °ACTIVITY: Your care partner should take you home directly after the procedure. You should plan to take it easy, moving slowly for the rest of the day. You can resume normal activity the day after the procedure however YOU SHOULD NOT DRIVE, use power tools, machinery or perform tasks that involve climbing or major physical exertion for 24 hours (because of the sedation medicines used during the test).  ° °SYMPTOMS TO REPORT IMMEDIATELY: °A gastroenterologist can be reached at any hour. Please call [phone]  for any of the following symptoms:  °Following lower endoscopy (colonoscopy, flexible sigmoidoscopy) °Excessive amounts of blood in the stool  °Significant tenderness, worsening of abdominal pains  °Swelling of the abdomen that is new, acute  °Fever of 100° or  higher  °Following upper endoscopy (EGD, EUS, ERCP, esophageal dilation) °Vomiting of blood or coffee ground material  °New, significant abdominal pain  °New, significant chest pain or pain under the shoulder blades  °Painful or persistently difficult swallowing  °New shortness of breath  °Black, tarry-looking or red, bloody stools ° °FOLLOW UP:  °If any biopsies were taken you will be contacted by phone or by letter within the next 1-3 weeks. Call [phone]  if you have not heard about the biopsies in 3 weeks.  °Please also call with any specific questions about appointments or follow up tests. ° °

## 2022-05-07 NOTE — H&P (Signed)
GASTROENTEROLOGY PROCEDURE H&P NOTE   Primary Care Physician: Eulas Post, MD  HPI: Carl Lam is a 77 y.o. 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  Past Medical History:  Diagnosis Date   Acute lower GI bleeding 10/11/2011, 12-16-2019   Anemia    BPH (benign prostatic hyperplasia)    Colon polyp    CVA (cerebral vascular accident) ([place]) 12/14/2019   light stroke per pt gait is off a little per pt   DM type 2 (diabetes mellitus, type 2) ([place]) 10/27/2008   Dyspnea    sob with  heavy exertion   History of blood transfusion 12/16/2019   HYPERLIPIDEMIA 04/27/2009   Qualifier: Diagnosis of  By: Elease Hashimoto MD, Bruce     HYPERTENSION 10/27/2008   Qualifier: Diagnosis of  By: Valma Cava LPN, Nancy     Intermittent self-catheterization of bladder    daily   Wears dentures    Wears glasses    Past Surgical History:  Procedure Laterality Date   COLONOSCOPY  10/12/2011   Procedure: COLONOSCOPY;  Surgeon: Lafayette Dragon, MD;  Location[facility] ENDOSCOPY;  Service: Endoscopy;  Laterality: N/A;   COLONOSCOPY WITH PROPOFOL N/A 12/16/2019   Procedure: COLONOSCOPY WITH PROPOFOL;  Surgeon: Ladene Artist, MD;  Location: [facility] ENDOSCOPY;  Service: Endoscopy;  Laterality: N/A;   ESOPHAGOGASTRODUODENOSCOPY  10/12/2011   Procedure: ESOPHAGOGASTRODUODENOSCOPY (EGD);  Surgeon: Lafayette Dragon, MD;  Location: [facility] ENDOSCOPY;  Service: Endoscopy;  Laterality: N/A;   ESOPHAGOGASTRODUODENOSCOPY (EGD) WITH PROPOFOL N/A 12/16/2019   Procedure: ESOPHAGOGASTRODUODENOSCOPY (EGD) WITH PROPOFOL;  Surgeon: Ladene Artist, MD;  Location: [facility] ENDOSCOPY;  Service: Endoscopy;  Laterality: N/A;   ESOPHAGOGASTRODUODENOSCOPY (EGD) WITH PROPOFOL N/A 05/12/2020   Procedure: ESOPHAGOGASTRODUODENOSCOPY (EGD) WITH PROPOFOL;  Surgeon: Milus Banister, MD;  Location: [facility] ENDOSCOPY;  Service: Endoscopy;  Laterality: N/A;    ESOPHAGOGASTRODUODENOSCOPY (EGD) WITH PROPOFOL N/A 10/05/2021   Procedure: ESOPHAGOGASTRODUODENOSCOPY (EGD) WITH PROPOFOL;  Surgeon: Milus Banister, MD;  Location: [facility] ENDOSCOPY;  Service: Gastroenterology;  Laterality: N/A;   EUS N/A 05/12/2020   Procedure: UPPER ENDOSCOPIC ULTRASOUND (EUS) RADIAL;  Surgeon: Milus Banister, MD;  Location: [facility] ENDOSCOPY;  Service: Endoscopy;  Laterality: N/A;   EUS N/A 10/05/2021   Procedure: UPPER ENDOSCOPIC ULTRASOUND (EUS) RADIAL;  Surgeon: Milus Banister, MD;  Location: [facility] ENDOSCOPY;  Service: Gastroenterology;  Laterality: N/A;   EUS N/A 10/05/2021   Procedure: UPPER ENDOSCOPIC ULTRASOUND (EUS) LINEAR;  Surgeon: Milus Banister, MD;  Location: [facility] ENDOSCOPY;  Service: Gastroenterology;  Laterality: N/A;   FINE NEEDLE ASPIRATION N/A 10/05/2021   Procedure: FINE NEEDLE ASPIRATION (FNA) LINEAR;  Surgeon: Milus Banister, MD;  Location: [facility] ENDOSCOPY;  Service: Gastroenterology;  Laterality: N/A;   HEMOSTASIS CLIP PLACEMENT  12/16/2019   Procedure: HEMOSTASIS CLIP PLACEMENT;  Surgeon: Ladene Artist, MD;  Location: [facility] ENDOSCOPY;  Service: Endoscopy;;   HERNIA REPAIR  2007   "navel"   JOINT REPLACEMENT     KNEE CARTILAGE SURGERY  1960's and 1970's   left; "2 total"   POLYPECTOMY  12/16/2019   Procedure: POLYPECTOMY;  Surgeon: Ladene Artist, MD;  Location: [place];  Service: Endoscopy;;   TOTAL HIP ARTHROPLASTY Right 2007   TRANSURETHRAL RESECTION OF PROSTATE N/A 06/27/2020   Procedure: TRANSURETHRAL RESECTION OF THE PROSTATE (TURP);  Surgeon: Lucas Mallow, MD;  Location: [facility];  Service: Urology;  Laterality: N/A;  REQUESTING 90 MINS FOR CASE   No current facility-administered medications for this encounter.   No current facility-administered medications for this encounter. No Known Allergies Family History  Problem Relation Age of Onset   Alcohol abuse Other        grandparent   Arthritis Other        arthritis    Diabetes Other        grandparent   Social History   Socioeconomic History   Marital status: Married    Spouse name: Not on file   Number of children: Not on file   Years of education: Not on file   Highest education level: Not on file  Occupational History   Not on file  Tobacco Use   Smoking status: Former    Packs/day: 0.30    Years: 15.00    Total pack years: 4.50    Types: Cigarettes    Quit date: 10/11/1978    Years since quitting: 43.6   Smokeless tobacco: Never  Vaping Use   Vaping Use: Never used  Substance and Sexual Activity   Alcohol use: Not Currently    Comment: 10/11/11 "last alcohol 27-28 years ago"   Drug use: Yes    Types: Marijuana    Comment: "recreational marijuana in [employer]; 1970's"   Sexual activity: Not Currently  Other Topics Concern   Not on file  Social History Narrative   Not on file   Social Determinants of Health   Financial Resource Strain: Not on file  Food Insecurity: Not on file  Transportation Needs: Not on file  Physical Activity: Not on file  Stress: Not on file  Social Connections: Not on file  Intimate Partner Violence: Not on file    Physical Exam: There were no vitals filed for this visit. There is no height or weight on file to calculate BMI. GEN: NAD EYE: Sclerae anicteric ENT: MMM CV: Non-tachycardic GI: Soft, NT/ND NEURO:  Alert & Oriented x 3  Lab Results: No results for input(s): "WBC", "HGB", "HCT", "PLT" in the last 72 hours. BMET No results for input(s): "NA", "K", "CL", "CO2", "GLUCOSE", "BUN", "CREATININE", "CALCIUM" in the last 72 hours. LFT No results for input(s): "PROT", "ALBUMIN", "AST", "ALT", "ALKPHOS", "BILITOT", "BILIDIR", "IBILI" in the last 72 hours. PT/INR No results for input(s): "LABPROT", "INR" in the last 72 hours.   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  The risks of an EUS including intestinal perforation, bleeding, infection, aspiration, and medication effects were discussed as was the possibility it may not give a definitive diagnosis if a biopsy is performed.  When a biopsy of the pancreas is done as part of the EUS, there is an additional risk of pancreatitis at the rate of about 1-2%.  It was explained that procedure related pancreatitis is typically mild, although it can be severe and even life threatening, which is why we do not perform random pancreatic biopsies and only biopsy a lesion/area we feel is concerning enough to warrant the risk.  The risks and benefits of endoscopic evaluation/treatment were discussed with the patient and/or family; these include but are not limited to the risk of perforation, infection, bleeding, missed lesions, lack of diagnosis, severe illness requiring hospitalization, as well as anesthesia and sedation related illnesses.  The patient's history has been reviewed, patient examined, no change in status, and deemed  stable for procedure.  The patient and/or family is agreeable to proceed.    Justice Britain, MD [facility] Advanced [facility]oscopy Office # [phone]

## 2022-05-08 ENCOUNTER — Encounter (HOSPITAL_COMMUNITY): Payer: Self-pay | Admitting: Gastroenterology

## 2022-05-08 LAB — CYTOLOGY - NON PAP

## 2022-05-08 LAB — CANCER ANTIGEN 19-9: CA 19-9: 7 U/mL (ref 0–35)

## 2022-05-08 LAB — SURGICAL PATHOLOGY

## 2022-05-09 NOTE — Anesthesia Postprocedure Evaluation (Signed)
Anesthesia Post Note  Patient: Carl Lam  Procedure(s) Performed: UPPER ENDOSCOPIC ULTRASOUND (EUS) RADIAL ESOPHAGOGASTRODUODENOSCOPY (EGD) WITH PROPOFOL BIOPSY FINE NEEDLE ASPIRATION (FNA) LINEAR     Patient location during evaluation: PACU Anesthesia Type: MAC Level of consciousness: awake and alert Pain management: pain level controlled Vital Signs Assessment: post-procedure vital signs reviewed and stable Respiratory status: spontaneous breathing, nonlabored ventilation, respiratory function stable and patient connected to nasal cannula oxygen Cardiovascular status: stable and blood pressure returned to baseline Postop Assessment: no apparent nausea or vomiting Anesthetic complications: no   No notable events documented.  Last Vitals:  Vitals:   05/07/22 1340 05/07/22 1345  BP: 124/79   Pulse:    Resp: (!) 21 19  Temp:    SpO2:      Last Pain:  Vitals:   05/08/22 1327  TempSrc:   PainSc: 0-No pain                 Tiajuana Amass

## 2022-05-10 ENCOUNTER — Encounter: Payer: Self-pay | Admitting: Gastroenterology

## 2022-05-15 NOTE — Progress Notes (Signed)
Noted  

## 2022-05-16 ENCOUNTER — Other Ambulatory Visit: Payer: Self-pay

## 2022-05-16 DIAGNOSIS — B9681 Helicobacter pylori [H. pylori] as the cause of diseases classified elsewhere: Secondary | ICD-10-CM

## 2022-05-16 MED ORDER — BISMUTH SUBSALICYLATE 262 MG PO CHEW: 524.0000 mg | CHEWABLE_TABLET | Freq: Four times a day (QID) | ORAL | 0 refills | Status: AC

## 2022-05-16 MED ORDER — OMEPRAZOLE 20 MG PO CPDR: 20.0000 mg | DELAYED_RELEASE_CAPSULE | Freq: Two times a day (BID) | ORAL | 0 refills | Status: DC

## 2022-05-16 MED ORDER — METRONIDAZOLE 250 MG PO TABS: 250.0000 mg | ORAL_TABLET | Freq: Four times a day (QID) | ORAL | 0 refills | Status: AC

## 2022-05-16 MED ORDER — DOXYCYCLINE HYCLATE 100 MG PO CAPS: 100.0000 mg | ORAL_CAPSULE | Freq: Two times a day (BID) | ORAL | 0 refills | Status: AC

## 2022-05-18 ENCOUNTER — Encounter: Payer: Self-pay | Admitting: Gastroenterology

## 2022-05-18 NOTE — Progress Notes (Unsigned)
[facility] pancreatic fluid analysis  CEA 4.4 ng/mL Amylase 70 units/L Glucose 58 mg/dL  The fluid analysis is similar to the previously performed analysis by my partner Dr. Ardis Hughs.  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  The biggest concern is that there has been this enhancement to the lesion and over the course of a few years it has increased in size.  With the normal CA 19-9, this gives some decreased concern.  I will have this patient's case discussed at an upcoming multidisciplinary conference in February or March.  We will update the patient's primary gastroenterologist.  Tentative plan for repeat MRI/MRCP in 4 to 6 months but that will be updated after discussion at [facility].   Carl Britain, MD [facility] Advanced [facility]oscopy Office # [phone]

## 2022-05-18 NOTE — Progress Notes (Signed)
Chester Holstein thanks very much for your help with this case

## 2022-05-21 ENCOUNTER — Other Ambulatory Visit: Payer: Self-pay | Admitting: Family Medicine

## 2022-05-21 DIAGNOSIS — I10 Essential (primary) hypertension: Secondary | ICD-10-CM

## 2022-05-21 NOTE — Progress Notes (Signed)
The pt has been advised of the results and agrees to the MRI as planned and [facility] conference

## 2022-05-24 ENCOUNTER — Encounter: Payer: Self-pay | Admitting: Gastroenterology

## 2022-06-02 ENCOUNTER — Other Ambulatory Visit: Payer: Self-pay | Admitting: Internal Medicine

## 2022-06-02 DIAGNOSIS — B9681 Helicobacter pylori [H. pylori] as the cause of diseases classified elsewhere: Secondary | ICD-10-CM

## 2022-06-03 NOTE — Progress Notes (Signed)
[facility]raGEN results The biological behavior this particular cyst is felt to be within 97% probability of most likely benign disease over the next 3 years as a result of the lack in significant molecular alterations The DNA quantity/quality = low quantity, poor quality (degraded) Oncogene point mutations KRAS = no mutation detected Tumor suppressor genes (LOH) = no LOH detected  It is felt that 1 approach that could be considered would be a repeat aspiration and analysis versus repeat imaging.  Will discuss case at [facility] as previously outlined with the likely follow-up imaging in 4 to 6 months  We will have these results scanned into the chart and mailed to the patient for his records.   Justice Britain, MD [facility] # [id number]

## 2022-06-04 ENCOUNTER — Telehealth: Payer: Self-pay | Admitting: Internal Medicine

## 2022-06-04 ENCOUNTER — Encounter: Payer: Self-pay | Admitting: Gastroenterology

## 2022-06-04 NOTE — Telephone Encounter (Signed)
Pt had questions for the H pylori treatment.  Questions answered in detailed. Pt verbalized understanding with all questions answered.

## 2022-06-04 NOTE — Telephone Encounter (Signed)
Did you fill these meds Remo Lipps? It looks like one is under [place] [facility] [address] Can you look at this with me Thanks

## 2022-06-04 NOTE — Telephone Encounter (Signed)
Inbound call from patient requesting a call back from the nurse to discuss medications. Patient stated that he had been put on medications for 14 days for an infection and needs to know which ones he needs to stop and if there is any he needs to continue to take. Please advise.

## 2022-06-18 ENCOUNTER — Other Ambulatory Visit: Payer: Self-pay | Admitting: Family Medicine

## 2022-06-18 ENCOUNTER — Other Ambulatory Visit: Payer: Federal, State, Local not specified - PPO

## 2022-06-18 DIAGNOSIS — B9681 Helicobacter pylori [H. pylori] as the cause of diseases classified elsewhere: Secondary | ICD-10-CM

## 2022-06-18 DIAGNOSIS — I10 Essential (primary) hypertension: Secondary | ICD-10-CM

## 2022-06-20 ENCOUNTER — Other Ambulatory Visit: Payer: Self-pay

## 2022-06-20 ENCOUNTER — Telehealth: Payer: Self-pay | Admitting: Gastroenterology

## 2022-06-20 LAB — H. PYLORI ANTIGEN, STOOL: H pylori Ag, Stl: NEGATIVE

## 2022-06-20 NOTE — Telephone Encounter (Signed)
I called and spoke with the patient today.  We discussed case at the multidisciplinary conference this morning.  Patient's imaging was reviewed. Patient's 2 prior EUS procedures done by Dr. Ardis Hughs as well as my most recent EUS were reviewed. Cytology was reviewed from most recent EUS as well as the pancreatic fluid analysis as previously documented.  Overall, the patient's previous cytology had suggested potential mucinous cystic neoplasm though the fluid analysis was not consistent with that.  As the imaging study had shown progressive enlargement of the pancreatic cyst as well as the solid like component that was present that was the main reason for repeat EUS.  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  With that being said the cytology findings from the fine-needle biopsy at the time of the most recent US did show atypical cells though not clearly diagnostic of malignancy.  The pancreatic fluid analysis [facility] did not suggest that this lesion had high risk of malignant transformation in the next 3 years of approximately 97% based on the sampling.  However, the lesion has been enlarging which has been the biggest concern and that to me is what we need to consider. The patient states that he continues to remain asymptomatic from this.  His diabetes has remained under good control.  After discussion about potential pathways of active surveillance with imaging versus active surveillance with imaging and surgical referral versus stopping all surveillance, the patient and I decided that we would continue active surveillance and have a low threshold to refer to hepatobiliary surgery.  The patient will need to undergo a repeat MRI/MRCP in 4 to 6 months with repeat CA 19-9 from my EUS in January.  I will forward  these results to the patient's primary gastroenterologist and primary care provider.   I appreciate our hepatobiliary surgeons being available for potential referral in the future pending the results at the repeat imaging.   Justice Britain, MD [facility] # [id number]

## 2022-06-20 NOTE — Progress Notes (Signed)
The proposed treatment discussed in conference is for discussion purpose only and is not a binding recommendation.  The patients have not been physically examined, or presented with their treatment options.  Therefore, final treatment plans cannot be decided.  

## 2022-06-20 NOTE — Telephone Encounter (Signed)
Message sent to myself to call pt in May to set up.

## 2022-07-01 ENCOUNTER — Other Ambulatory Visit: Payer: Self-pay | Admitting: Family Medicine

## 2022-07-07 ENCOUNTER — Other Ambulatory Visit: Payer: Self-pay | Admitting: Family Medicine

## 2022-08-15 ENCOUNTER — Telehealth: Payer: Self-pay

## 2022-08-15 NOTE — Telephone Encounter (Signed)
Spoke with patient. He does not wish to f/u with MRI or labs at this time. He will call his PCP and discuss. He will call back if he wishes to proceed.

## 2022-08-15 NOTE — Telephone Encounter (Signed)
-----   Message from Loretha Stapler, RN sent at 06/20/2022  1:33 PM EST ----- Alexia Freestone,  This patient needs a repeat MRI/MRCP between May and July of this year for pancreatic cyst/mass.  Patient should undergo a CMP and CA 19-9 at that time.

## 2022-08-15 NOTE — Telephone Encounter (Signed)
The patient has the opportunity to decide what he wants to have done and what he does not want to have done. I will forward this to his primary gastroenterologist and his primary care provider. Thanks. GM

## 2022-08-16 NOTE — Telephone Encounter (Signed)
Lm on vm for patient to return call to schedule routine office visit with Dr. Adela Lank (pancreatic cyst).

## 2022-08-16 NOTE — Telephone Encounter (Signed)
Thanks Gabe. [place] can you help book this patient a routine office visit follow up with me an I am happy to discuss his options further with him. Gabe thanks for the help with this case.

## 2022-08-20 ENCOUNTER — Other Ambulatory Visit: Payer: Self-pay | Admitting: Family Medicine

## 2022-08-20 DIAGNOSIS — I10 Essential (primary) hypertension: Secondary | ICD-10-CM

## 2022-08-21 NOTE — Telephone Encounter (Signed)
Called and spoke with patient to schedule routine f/u appt. Pt has been scheduled to see Dr. Adela Lank on 11/29/22 at 8:30 am. Pt requested that I mail his appt information. Pt confirmed address on file. Pt had no concerns at the end of the call.  Appt information mailed to patient.

## 2022-08-29 ENCOUNTER — Ambulatory Visit: Payer: Federal, State, Local not specified - PPO | Admitting: Family Medicine

## 2022-10-05 ENCOUNTER — Ambulatory Visit: Payer: Federal, State, Local not specified - PPO | Admitting: Family Medicine

## 2022-10-05 VITALS — BP 140/80 | HR 76 | Temp 98.3°F | Ht 74.0 in | Wt 235.7 lb

## 2022-10-05 DIAGNOSIS — I10 Essential (primary) hypertension: Secondary | ICD-10-CM

## 2022-10-05 DIAGNOSIS — M25471 Effusion, right ankle: Secondary | ICD-10-CM

## 2022-10-05 DIAGNOSIS — E1165 Type 2 diabetes mellitus with hyperglycemia: Secondary | ICD-10-CM

## 2022-10-05 DIAGNOSIS — M25472 Effusion, left ankle: Secondary | ICD-10-CM

## 2022-10-05 DIAGNOSIS — Z794 Long term (current) use of insulin: Secondary | ICD-10-CM | POA: Diagnosis not present

## 2022-10-05 LAB — POCT GLYCOSYLATED HEMOGLOBIN (HGB A1C): Hemoglobin A1C: 7.1 % — AB (ref 4.0–5.6)

## 2022-10-05 NOTE — Progress Notes (Signed)
Established Patient Office Visit  Subjective   Patient ID: Carl Lam, male    DOB: 1945/10/25  Age: 77 y.o. MRN: [id number]  Chief Complaint  Patient presents with   Medical Management of Chronic Issues    HPI   Carl Lam is here for medical follow-up.  He has history of hypertension, hyperlipidemia, type 2 diabetes, history of CVA.  He has been followed closely by GI with pancreatic mass and had repeat EUS several months ago.  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  Diagnosis at this point is still not totally clear.  Patient has decided against any further interventions at this time.  Proposed follow-up MRI/MRCP with repeat CA 19.  He has scheduled follow-up in August with GI.  Regarding diabetes not checking blood sugars regularly.  His last A1c was 6.9%.  He remains on combination therapy with Basaglar and metformin.  His blood pressure has been generally controlled in the past on chlorthalidone, amlodipine, and lisinopril.  Blood pressure slightly up today.  Eats out about once per week.  Has had some recent mild ankle edema.  Currently not exercising much.  Appetite and weight are stable.  No recent chest pains.  Past Medical History:  Diagnosis Date   Acute lower GI bleeding 10/11/2011, 12-16-2019   Anemia    BPH (benign prostatic hyperplasia)    Colon polyp    CVA (cerebral vascular accident) (HCC) 12/14/2019   light stroke per pt gait is off a little per pt   DM type 2 (diabetes mellitus, type 2) (HCC) 10/27/2008   Dyspnea    sob with  heavy exertion   History of blood transfusion 12/16/2019   HYPERLIPIDEMIA 04/27/2009   Qualifier: Diagnosis of  By: Caryl Never MD, Deitrick Ferreri     HYPERTENSION 10/27/2008   Qualifier: Diagnosis of  By: Gabriel Rung LPN, Nancy     Intermittent self-catheterization of bladder    daily   Wears dentures    Wears glasses    Past Surgical History:  Procedure Laterality Date   BIOPSY  05/07/2022   Procedure: BIOPSY;  Surgeon:  Lemar Lofty., MD;  Location: [facility];  Service: Gastroenterology;;   COLONOSCOPY  10/12/2011   Procedure: COLONOSCOPY;  Surgeon: Hart Carwin, MD;  Location: [facility] ENDOSCOPY;  Service: Endoscopy;  Laterality: N/A;   COLONOSCOPY WITH PROPOFOL N/A 12/16/2019   Procedure: COLONOSCOPY WITH PROPOFOL;  Surgeon: Meryl Dare, MD;  Location[facility] ENDOSCOPY;  Service: Endoscopy;  Laterality: N/A;   ESOPHAGOGASTRODUODENOSCOPY  10/12/2011   Procedure: ESOPHAGOGASTRODUODENOSCOPY (EGD);  Surgeon: Hart Carwin, MD;  Location: [facility] ENDOSCOPY;  Service: Endoscopy;  Laterality: N/A;   ESOPHAGOGASTRODUODENOSCOPY (EGD) WITH PROPOFOL N/A 12/16/2019   Procedure: ESOPHAGOGASTRODUODENOSCOPY (EGD) WITH PROPOFOL;  Surgeon: Meryl Dare, MD;  Location: [facility] ENDOSCOPY;  Service: Endoscopy;  Laterality: N/A;   ESOPHAGOGASTRODUODENOSCOPY (EGD) WITH PROPOFOL N/A 05/12/2020   Procedure: ESOPHAGOGASTRODUODENOSCOPY (EGD) WITH PROPOFOL;  Surgeon: Rachael Fee, MD;  Location: [facility] ENDOSCOPY;  Service: Endoscopy;  Laterality: N/A;   ESOPHAGOGASTRODUODENOSCOPY (EGD) WITH PROPOFOL N/A 10/05/2021   Procedure: ESOPHAGOGASTRODUODENOSCOPY (EGD) WITH PROPOFOL;  Surgeon: Rachael Fee, MD;  Location: [facility] ENDOSCOPY;  Service: Gastroenterology;  Laterality: N/A;   ESOPHAGOGASTRODUODENOSCOPY (EGD) WITH PROPOFOL N/A 05/07/2022   Procedure: ESOPHAGOGASTRODUODENOSCOPY (EGD) WITH PROPOFOL;  Surgeon: Meridee Score Netty Starring., MD;  Location: [facility] ENDOSCOPY;  Service: Gastroenterology;  Laterality: N/A;   EUS N/A 05/12/2020   Procedure: UPPER ENDOSCOPIC ULTRASOUND (EUS) RADIAL;  Surgeon: Rachael Fee, MD;  Location: [facility] ENDOSCOPY;  Service:  Endoscopy;  Laterality: N/A;   EUS N/A 10/05/2021   Procedure: UPPER ENDOSCOPIC ULTRASOUND (EUS) RADIAL;  Surgeon: Rachael Fee, MD;  Location: [facility] ENDOSCOPY;  Service: Gastroenterology;  Laterality: N/A;   EUS N/A 10/05/2021   Procedure: UPPER ENDOSCOPIC ULTRASOUND (EUS) LINEAR;  Surgeon: Rachael Fee, MD;  Location: [facility] ENDOSCOPY;  Service: Gastroenterology;  Laterality: N/A;   EUS N/A 05/07/2022   Procedure: UPPER ENDOSCOPIC ULTRASOUND (EUS) RADIAL;  Surgeon: Lemar Lofty., MD;  Location: [facility] ENDOSCOPY;  Service: Gastroenterology;  Laterality: N/A;   FINE NEEDLE ASPIRATION N/A 10/05/2021   Procedure: FINE NEEDLE ASPIRATION (FNA) LINEAR;  Surgeon: Rachael Fee, MD;  Location: [facility] ENDOSCOPY;  Service: Gastroenterology;  Laterality: N/A;   FINE NEEDLE ASPIRATION N/A 05/07/2022   Procedure: FINE NEEDLE ASPIRATION (FNA) LINEAR;  Surgeon: Lemar Lofty., MD;  Location: [facility] ENDOSCOPY;  Service: Gastroenterology;  Laterality: N/A;   HEMOSTASIS CLIP PLACEMENT  12/16/2019   Procedure: HEMOSTASIS CLIP PLACEMENT;  Surgeon: Meryl Dare, MD;  Location: [facility] ENDOSCOPY;  Service: Endoscopy;;   HERNIA REPAIR  2007   "navel"   JOINT REPLACEMENT     KNEE CARTILAGE SURGERY  1960's and 1970's   left; "2 total"   POLYPECTOMY  12/16/2019   Procedure: POLYPECTOMY;  Surgeon: Meryl Dare, MD;  Location: [facility] ENDOSCOPY;  Service: Endoscopy;;   TOTAL HIP ARTHROPLASTY Right 2007   TRANSURETHRAL RESECTION OF PROSTATE N/A 06/27/2020   Procedure: TRANSURETHRAL RESECTION OF THE PROSTATE (TURP);  Surgeon: Crista Elliot, MD;  Location: [facility];  Service: Urology;  Laterality: N/A;  REQUESTING 39 MINS FOR CASE    reports that he quit smoking about 44 years ago. His smoking use included cigarettes. He has a 4.50 pack-year smoking history. He has never used smokeless tobacco. He reports that he does not currently use alcohol. He reports current drug use. Drug: Marijuana. family history includes Alcohol abuse in an other family member; Arthritis in an other family member; Diabetes in an other family member. No Known Allergies  Review of Systems  Constitutional:  Negative for malaise/fatigue.  Eyes:  Negative for blurred vision.  Respiratory:  Negative for shortness of  breath.   Cardiovascular:  Negative for chest pain.  Neurological:  Negative for dizziness, weakness and headaches.      Objective:     BP (!) 140/80 (BP Location: Left Arm, Cuff Size: Normal)   Pulse 76   Temp 98.3 F (36.8 C) (Oral)   Ht 6\' 2"  (1.88 m)   Wt 235 lb 11.2 oz (106.9 kg)   SpO2 99%   BMI 30.26 kg/m  BP Readings from Last 3 Encounters:  10/05/22 (!) 140/80  05/07/22 124/79  04/30/22 130/78   Wt Readings from Last 3 Encounters:  10/05/22 235 lb 11.2 oz (106.9 kg)  05/07/22 236 lb 1.8 oz (107.1 kg)  04/30/22 236 lb 3.2 oz (107.1 kg)      Physical Exam Vitals reviewed.  Constitutional:      Appearance: He is well-developed.  HENT:     Right Ear: External ear normal.     Left Ear: External ear normal.  Eyes:     Pupils: Pupils are equal, round, and reactive to light.  Neck:     Thyroid: No thyromegaly.  Cardiovascular:     Rate and Rhythm: Normal rate and regular rhythm.  Pulmonary:     Effort: Pulmonary effort is normal. No respiratory distress.     Breath sounds: Normal breath  sounds. No wheezing or rales.  Musculoskeletal:     Cervical back: Neck supple.     Comments: He does have trace pitting edema feet and lower ankles bilaterally.  Neurological:     Mental Status: He is alert and oriented to person, place, and time.      Results for orders placed or performed in visit on 10/05/22  POC HgB A1c  Result Value Ref Range   Hemoglobin A1C 7.1 (A) 4.0 - 5.6 %   HbA1c POC (<> result, manual entry)     HbA1c, POC (prediabetic range)     HbA1c, POC (controlled diabetic range)      Last CBC Lab Results  Component Value Date   WBC 5.8 02/28/2022   HGB 13.1 02/28/2022   HCT 39.2 02/28/2022   MCV 91.9 02/28/2022   MCH 28.2 06/23/2020   RDW 13.6 02/28/2022   PLT 281.0 02/28/2022   Last metabolic panel Lab Results  Component Value Date   GLUCOSE 114 (H) 04/30/2022   NA 142 04/30/2022   K 4.7 04/30/2022   CL 107 04/30/2022   CO2 26  04/30/2022   BUN 26 (H) 04/30/2022   CREATININE 1.71 (H) 04/30/2022   GFRNONAA 50 (L) 06/23/2020   CALCIUM 9.6 04/30/2022   PROT 7.4 04/30/2022   ALBUMIN 4.3 04/30/2022   BILITOT 0.7 04/30/2022   ALKPHOS 53 04/30/2022   AST 12 04/30/2022   ALT 9 04/30/2022   ANIONGAP 7 06/23/2020   Last lipids Lab Results  Component Value Date   CHOL 124 04/30/2022   HDL 38.00 (L) 04/30/2022   LDLCALC 72 04/30/2022   LDLDIRECT 168.2 07/11/2012   TRIG 68.0 04/30/2022   CHOLHDL 3 04/30/2022   Last hemoglobin A1c Lab Results  Component Value Date   HGBA1C 7.1 (A) 10/05/2022   Last thyroid functions Lab Results  Component Value Date   TSH 2.66 07/15/2017      The ASCVD Risk score (Arnett DK, et al., 2019) failed to calculate for the following reasons:   The patient has a prior MI or stroke diagnosis    Assessment & Plan:   #1 type 2 diabetes stable with A1c 7.1% which is reasonable control given his age.  Continue current regimen.  Continue low glycemic diet.  Reassess A1c in 3 months  #2 hypertension.  Not to goal today.  He is currently on 3 drug regimen as above.  We discussed option of additional medication versus lifestyle modification he would like to try to drop a few pounds to step up his regular activity such as walking and watch sodium intake closely and reassess in 3 months.  Not further to goal at that point consider possible addition of low-dose hydralazine versus addition of Aldactone  #3 pancreatic mass.  Etiology unclear at this point.  Had extensive follow-up through GI and encouraged to continue close follow-up there  #4 bilateral ankle edema.  Probably exacerbated by amlodipine and suspect some venous stasis.  Recommend frequent elevation.  Consider knee-high compression.  Continue chlorthalidone.  Watch sodium intake.  Follow-up for any increased edema or other concerns such as dyspnea  Return in about 3 months (around 01/05/2023).    Evelena Peat, MD

## 2022-10-06 ENCOUNTER — Other Ambulatory Visit: Payer: Self-pay | Admitting: Family Medicine

## 2022-10-24 ENCOUNTER — Other Ambulatory Visit: Payer: Self-pay | Admitting: Family Medicine

## 2022-11-05 ENCOUNTER — Other Ambulatory Visit: Payer: Self-pay | Admitting: Family Medicine

## 2022-11-28 NOTE — Progress Notes (Unsigned)
HPI :  77 year old male here for follow-up visit, for pancreatic cyst, history of iron deficiency, history of colon polyps.  Recall that I initially saw him on December 15, 2019 when he was admitted to the hospital for change in gait/speech.  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  During his hospital course he developed significant change in his hemoglobin, 15 to 8s in the setting of dark stools with normal BUN.  Of note he had a similar presentation in 2013 when he had a GI bleed which led to EGD, colonoscopy, tagged RBC scan, thought to have a right-sided diverticular bleed at the time.  Neurology was planning antiplatelet therapy and he underwent upper and lower endoscopy as outlined below:   EGD 12/16/19 - small hiatal hernia, otherwise normal exam, no source of bleeding   Colonoscopy 12/16/19 - Mild diverticulosis in the right colon. There was no evidence of diverticular bleeding. - Moderate diverticulosis in the left colon. There was no evidence of diverticular bleeding. - One 8 mm polyp in the descending colon, removed with a cold snare. Resected and retrieved. - One 10 mm polyp in the rectum, removed with a cold snare. Resected and retrieved. Persistent oozing at polypectomy site. Clips (MR conditional) were placed. - Small internal hemorrhoid. - No blood noted in the colon prior to rectal polypectomy. - The examination was otherwise normal on direct and retroflexion views.   FINAL MICROSCOPIC DIAGNOSIS:   A. RECTUM AND DESCENDING COLON, POLYPECTOMY:  - Tubular adenoma(s) and tubulovillous adenoma(s)  - Negative for high-grade dysplasia or malignancy   He was thought to have a suspected diverticular bleed at that time. He was unfortunately readmitted on September 2021 with recurrent bleeding, this time dark red bloody bowel movements.  Hemoglobin had dropped from 10 to 8.1 at that time.  It was thought that he more than likely had a diverticular bleed that was recurrent  versus less likely bleeding from polypectomy.  He was observed overnight and discharged home in stable condition.  Eventually had a capsule endoscopy in November 2021 which was a fair prep and incomplete study due to capsule retention in the small bowel.  2 small AVMs noted.  Follow-up CT enterography showed no small bowel pathology.  Capsule endoscopy 03/03/20 - fair prep, incomplete study, capsule retained in small bowel. 2 small AVMs in mid small bowel    CT enterography 04/05/20 -  IMPRESSION: 1. No small bowel pathology to explain anemia. 2. Hypoattenuating pancreatic tail lesion. This could represent a septated cystic indolent neoplasm. However, adenocarcinoma cannot be excluded. Potential clinical strategies include more definitive characterization with pre and post contrast abdominal MRI/MRCP versus endoscopic ultrasound sampling (if feasible). 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 4. Left greater than right hydroureteronephrosis with suboptimal evaluation of the pelvis secondary to right hip arthroplasty. Favored to be related to bladder distension in the setting of prostatic enlargement. 5. Coronary artery atherosclerosis. Aortic Atherosclerosis (ICD10-I70.0).  [place] as outlined:   Given the pancreatic finding he was referred to Dr. Christella Hartigan for an EUS.  EUS done with Dr. Christella Hartigan 05/12/20 - 2.4cm by 2.7cm multicytic lesion in the tail of the pancreas with internal dense thickened septea vs soft tissue component. The outer margins are well defined. There were several large overlying blood vessels between the lesion and the scope tip rendering FNA too risky   Follow-up MRCP as outlined:  MRCP 07/03/20 - IMPRESSION: 1. 2.4 x 2.1 cm cystic lesion  in the tail region the pancreas as detailed above. No significant change when compared to prior CT. Serous cystadenoma is possible. Tissue sampling via EUS versus close imaging follow-up depending  on the clinical situation. 2. Persistent bilateral hydroureteronephrosis. 3. No abdominal adenopathy.     Since his last visit he has been followed with MRIs and EUS exams for the pancreatic cyst.  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  Dr. Meridee Score lasted an EUS in January.  He had the patient's case presented at multidisciplinary conference, they discussed plans for interval imaging with MRI in 6 months and again CEA level, given enlargement of the cyst over time.  The patient denies any abdominal pains that bother him on a routine basis.  His weight is stable, has actually gained a bit of weight.  He eats very well.  No nausea or vomiting.  He has occasional constipation while on iron.  He takes generic fiber pill over-the-counter which she states does not help too much.  He has not had overt bleeding. Last hemoglobin done November 2023, hemoglobin 13.1 and MCV 92. Of note he had a tubulovillous adenoma removed on his last colonoscopy in 2021, he is due for surveillance colonoscopy at this time.  He denies any cardiopulmonary symptoms otherwise is feeling well.  Of note at the time of his EUS EGD in January test positive for H. pylori.  He was treated for this with stool testing that showed eradication of H. pylori        PREVIOUS ENDOSCOPIC EVALUATIONS / GI STUDIES :   June 2013 Colonoscopy for hematochezia --poor prep --blood throughout colon. Presumed right sided diverticular hemorrhage   June 2013 EGD for hematochezia --small hiatal hernia    EGD 12/16/19 - small hiatal hernia, otherwise normal exam, no source of bleeding   Colonoscopy 12/16/19 - Mild diverticulosis in the right colon. There was no evidence of diverticular bleeding. - Moderate diverticulosis in the left colon. There was no evidence of diverticular bleeding. - One  8 mm polyp in the descending colon, removed with a cold snare. Resected and retrieved. - One 10 mm polyp in the rectum, removed with a cold snare. Resected and retrieved. Persistent oozing at polypectomy site. Clips (MR conditional) were placed. - Small internal hemorrhoid. - No blood noted in the colon prior to rectal polypectomy. - The examination was otherwise normal on direct and retroflexion views.   FINAL MICROSCOPIC DIAGNOSIS:   A. RECTUM AND DESCENDING COLON, POLYPECTOMY:  - Tubular adenoma(s) and tubulovillous adenoma(s)  - Negative for high-grade dysplasia or malignancy   Capsule endoscopy 03/03/20 - fair prep, incomplete study, capsule retained in small bowel. 2 small AVMs in mid small bowel    CT enterography 04/05/20 -  IMPRESSION: 1. No small bowel pathology to explain anemia. 2. Hypoattenuating pancreatic tail lesion. This could represent a septated cystic indolent neoplasm. However, adenocarcinoma cannot be excluded. Potential clinical strategies include more definitive characterization with pre and post contrast abdominal MRI/MRCP versus endoscopic ultrasound sampling (if feasible). 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 4. Left greater than right hydroureteronephrosis with suboptimal evaluation of the pelvis secondary to right hip arthroplasty. Favored to be related to bladder distension in the setting of prostatic enlargement. 5. Coronary artery atherosclerosis. Aortic Atherosclerosis (ICD10-I70.0).  [place] as outlined   EUS done with Dr. Christella Hartigan  05/12/20 - 2.4cm by 2.7cm multicytic lesion in the tail of the pancreas with internal dense thickened septea vs soft tissue component. The outer margins are well defined. There were several large overlying blood vessels between the lesion and the scope tip rendering FNA too risky   Follow-up MRCP as outlined:  MRCP 07/03/20 - IMPRESSION: 1. 2.4 x 2.1 cm cystic lesion in the  tail region the pancreas as detailed above. No significant change when compared to prior CT. Serous cystadenoma is possible. Tissue sampling via EUS versus close imaging follow-up depending on the clinical situation. 2. Persistent bilateral hydroureteronephrosis. 3. No abdominal adenopathy.  MRCP 07/15/21: IMPRESSION: 1. Slowly enlarging elongated multiseptated cystic lesion in the tail of the pancreas. More obvious focus of nodular enhancement is demonstrated which measures 9 mm. Recommend tissue sampling or consideration for excision. 2. No other significant abdominal findings.   EUS 10/05/21: 1. Multicystic lesion and extreme tail of pancreas measuring 3.6 cm maximally on this exam. This lesions consists of multiple very small cysts, the largest measuring about 6 mm across. There were thick septae versus nodular component centrally. This did not have the overall look of a neoplastic lesion. I performed transgastric fine-needle aspiration using a 22-gauge EUS expect needle. First targeting the largest cyst cavity, I aspirated about 2 cc of clear thin fluid and sent this for CEA and amylase levels. I then aspirated the thick septae versus internal nodular component with a single pass of the same needle and sent the aspirate for cytology testing. 2. The cystic lesion above did not have clear communication with the main pancreatic duct which was completely normal-appearing. 3. There was a second very small cyst in the head of the pancreas measuring 7 mm across. This was not sampled, it was round and anechoic and completely innocent appearing. 4. No peripancreatic adenopathy 5. The pancreatic parenchyma was otherwise normal. 6. CBD was normal nondilated without stones. 7. Gallbladder was normal. 8. Limited view of the liver, spleen, portal and splenic vessels were all normal.  FINAL MICROSCOPIC DIAGNOSIS:  - Atypical cells present  -Scant mucinous epithelium.   CEA 2.2, amylase 17   MRCP  03/26/22: IMPRESSION: 1. Complex mixed cystic and solid lesion in the tail of the pancreas which remains concerning for probable neoplasm, demonstrating minimal interval growth compared to the prior examination. Given the relative stability compared to prior examinations, the possibility of low-grade neoplasm should be considered, however, further evaluation with endoscopic ultrasound and tissue sampling is once again suggested. 2. Colonic diverticulosis.   EUS 05/07/22: EGD impression: - No gross lesions in the majority of esophagus. LA Grade A esophagitis with no bleeding in the very distal esophagus. - Z-line irregular, 35 cm from the incisors. - 3 cm hiatal hernia. - Erythematous mucosa in the greater curvature of the gastric body. No other gross lesions in the entire stomach. Biopsied. - No gross lesions in the duodenal bulb, in the first portion of the duodenum and in the second portion of the duodenum. - Normal major papilla (hidden under a hood).   EUS impression: - A mixed cystic/solid lesion was identified in the pancreatic tail. Cytology results are pending. However, the endosonographic appearance is suggestive of a serous cystadenoma (however on prior fine-needle aspirations by other GI provider, there was concern for mucinous component even though CEA level was low). Fine needle aspiration for fluid performed of the largest cystic component. Fine needle aspiration performed of the solid component. - Small cystic lesion in the head of  pancreas and otherwise nondilated duct with findings of hyperechoic strands and ectatic duct. - There was no sign of significant pathology in the common bile duct and in the common hepatic duct. - No malignant-appearing lymph nodes were visualized in the celiac region (level 20), peripancreatic region and porta hepatis region  FINAL MICROSCOPIC DIAGNOSIS:   A. 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  -  Numerous Helicobacter pylori organisms are identified on the HE  stained slide.   FINAL MICROSCOPIC DIAGNOSIS:  - Atypical cells present   CA 19-9 is 7 CEA 4.4, amylase 70   H pylori stool antigen March 2024 is negative    Echo 12/15/2019: EF 60-65%, grade I DD     Past Medical History:  Diagnosis Date   Acute lower GI bleeding 10/11/2011, 12-16-2019   Anemia    BPH (benign prostatic hyperplasia)    Colon polyp    CVA (cerebral vascular accident) (HCC) 12/14/2019   light stroke per pt gait is off a little per pt   DM type 2 (diabetes mellitus, type 2) (HCC) 10/27/2008   Dyspnea    sob with  heavy exertion   History of blood transfusion 12/16/2019   HYPERLIPIDEMIA 04/27/2009   Qualifier: Diagnosis of  By: Caryl Never MD, Bruce     HYPERTENSION 10/27/2008   Qualifier: Diagnosis of  By: Gabriel Rung LPN, Nancy     Intermittent self-catheterization of bladder    daily   Wears dentures    Wears glasses      Past Surgical History:  Procedure Laterality Date   BIOPSY  05/07/2022   Procedure: BIOPSY;  Surgeon: Lemar Lofty., MD;  Location: [facility] ENDOSCOPY;  Service: Gastroenterology;;   COLONOSCOPY  10/12/2011   Procedure: COLONOSCOPY;  Surgeon: Hart Carwin, MD;  Location: [facility] ENDOSCOPY;  Service: Endoscopy;  Laterality: N/A;   COLONOSCOPY WITH PROPOFOL N/A 12/16/2019   Procedure: COLONOSCOPY WITH PROPOFOL;  Surgeon: Meryl Dare, MD;  Location: [facility] ENDOSCOPY;  Service: Endoscopy;  Laterality: N/A;   ESOPHAGOGASTRODUODENOSCOPY  10/12/2011   Procedure: ESOPHAGOGASTRODUODENOSCOPY (EGD);  Surgeon: Hart Carwin, MD;  Location: [facility] ENDOSCOPY;  Service: Endoscopy;  Laterality: N/A;   ESOPHAGOGASTRODUODENOSCOPY (EGD) WITH PROPOFOL N/A 12/16/2019   Procedure: ESOPHAGOGASTRODUODENOSCOPY (EGD) WITH PROPOFOL;  Surgeon: Meryl Dare, MD;  Location: [facility] ENDOSCOPY;  Service: Endoscopy;  Laterality: N/A;   ESOPHAGOGASTRODUODENOSCOPY (EGD) WITH  PROPOFOL N/A 05/12/2020   Procedure: ESOPHAGOGASTRODUODENOSCOPY (EGD) WITH PROPOFOL;  Surgeon: Rachael Fee, MD;  Location: [facility] ENDOSCOPY;  Service: Endoscopy;  Laterality: N/A;   ESOPHAGOGASTRODUODENOSCOPY (EGD) WITH PROPOFOL N/A 10/05/2021   Procedure: ESOPHAGOGASTRODUODENOSCOPY (EGD) WITH PROPOFOL;  Surgeon: Rachael Fee, MD;  Location: [facility] ENDOSCOPY;  Service: Gastroenterology;  Laterality: N/A;   ESOPHAGOGASTRODUODENOSCOPY (EGD) WITH PROPOFOL N/A 05/07/2022   Procedure: ESOPHAGOGASTRODUODENOSCOPY (EGD) WITH PROPOFOL;  Surgeon: Meridee Score Netty Starring., MD;  Location: [facility] ENDOSCOPY;  Service: Gastroenterology;  Laterality: N/A;   EUS N/A 05/12/2020   Procedure: UPPER ENDOSCOPIC ULTRASOUND (EUS) RADIAL;  Surgeon: Rachael Fee, MD;  Location: [facility] ENDOSCOPY;  Service: Endoscopy;  Laterality: N/A;   EUS N/A 10/05/2021   Procedure: UPPER ENDOSCOPIC ULTRASOUND (EUS) RADIAL;  Surgeon: Rachael Fee, MD;  Location: [facility] ENDOSCOPY;  Service: Gastroenterology;  Laterality: N/A;   EUS N/A 10/05/2021   Procedure: UPPER ENDOSCOPIC ULTRASOUND (EUS) LINEAR;  Surgeon: Rachael Fee, MD;  Location: [facility] ENDOSCOPY;  Service: Gastroenterology;  Laterality: N/A;   EUS N/A 05/07/2022  Procedure: UPPER ENDOSCOPIC ULTRASOUND (EUS) RADIAL;  Surgeon: Meridee Score Netty Starring., MD;  Location: [facility] ENDOSCOPY;  Service: Gastroenterology;  Laterality: N/A;   FINE NEEDLE ASPIRATION N/A 10/05/2021   Procedure: FINE NEEDLE ASPIRATION (FNA) LINEAR;  Surgeon: Rachael Fee, MD;  Location: [facility] ENDOSCOPY;  Service: Gastroenterology;  Laterality: N/A;   FINE NEEDLE ASPIRATION N/A 05/07/2022   Procedure: FINE NEEDLE ASPIRATION (FNA) LINEAR;  Surgeon: Lemar Lofty., MD;  Location: [facility] ENDOSCOPY;  Service: Gastroenterology;  Laterality: N/A;   HEMOSTASIS CLIP PLACEMENT  12/16/2019   Procedure: HEMOSTASIS CLIP PLACEMENT;  Surgeon: Meryl Dare, MD;  Location: [facility] ENDOSCOPY;  Service: Endoscopy;;   HERNIA REPAIR  2007    "navel"   JOINT REPLACEMENT     KNEE CARTILAGE SURGERY  1960's and 1970's   left; "2 total"   POLYPECTOMY  12/16/2019   Procedure: POLYPECTOMY;  Surgeon: Meryl Dare, MD;  Location: [facility] ENDOSCOPY;  Service: Endoscopy;;   TOTAL HIP ARTHROPLASTY Right 2007   TRANSURETHRAL RESECTION OF PROSTATE N/A 06/27/2020   Procedure: TRANSURETHRAL RESECTION OF THE PROSTATE (TURP);  Surgeon: Crista Elliot, MD;  Location: [facility];  Service: Urology;  Laterality: N/A;  REQUESTING 74 MINS FOR CASE   Family History  Problem Relation Age of Onset   Alcohol abuse Other        grandparent   Arthritis Other        arthritis   Diabetes Other        grandparent   Social History   Tobacco Use   Smoking status: Former    Current packs/day: 0.00    Average packs/day: 0.3 packs/day for 15.0 years (4.5 ttl pk-yrs)    Types: Cigarettes    Start date: 10/11/1963    Quit date: 10/11/1978    Years since quitting: 44.1   Smokeless tobacco: Never  Vaping Use   Vaping status: Never Used  Substance Use Topics   Alcohol use: Not Currently    Comment: 10/11/11 "last alcohol 27-28 years ago"   Drug use: Yes    Types: Marijuana    Comment: "recreational marijuana in [employer]; 1970's"   Current Outpatient Medications  Medication Sig Dispense Refill   amLODipine (NORVASC) 10 MG tablet Take 1 tablet by mouth once daily 90 tablet 1   atorvastatin (LIPITOR) 20 MG tablet Take 1 tablet by mouth once daily 90 tablet 1   carboxymethylcellul-glycerin (EQ LUBRICATING EYE DROPS) 0.5-0.9 % ophthalmic solution Place 1 drop into both eyes daily.     chlorthalidone (HYGROTON) 25 MG tablet Take 1 tablet by mouth once daily 90 tablet 1   ferrous sulfate 325 (65 FE) MG EC tablet Take 325 mg by mouth every evening.     gabapentin (NEURONTIN) 100 MG capsule Take 2 capsules by mouth twice daily 120 capsule 0   Insulin Glargine (BASAGLAR KWIKPEN) 100 UNIT/ML INJECT 30 UNITS SUBCUTANEOUSLY ONCE DAILY AT  NOON  (Patient taking differently: Inject 22 Units into the skin every evening.) 15 mL 3   lisinopril (ZESTRIL) 20 MG tablet Take 1 tablet by mouth once daily 90 tablet 0   metFORMIN (GLUCOPHAGE) 500 MG tablet Take 2 tablets by mouth twice daily 360 tablet 1   omeprazole (PRILOSEC) 20 MG capsule Take 1 capsule (20 mg total) by mouth daily. Take 30 minutes before a meal. 30 capsule 6   PEG-KCl-NaCl-NaSulf-Na Asc-C (PLENVU) 140 g SOLR Take 1 kit by mouth once for 1 dose. 1 each 0   No current facility-administered  medications for this visit.   No Known Allergies   Review of Systems: All systems reviewed and negative except where noted in HPI.   Lab Results  Component Value Date   WBC 5.8 02/28/2022   HGB 13.1 02/28/2022   HCT 39.2 02/28/2022   MCV 91.9 02/28/2022   PLT 281.0 02/28/2022    Lab Results  Component Value Date   NA 142 04/30/2022   CL 107 04/30/2022   K 4.7 04/30/2022   CO2 26 04/30/2022   BUN 26 (H) 04/30/2022   CREATININE 1.71 (H) 04/30/2022   GFR 38.57 (L) 04/30/2022   CALCIUM 9.6 04/30/2022   ALBUMIN 4.3 04/30/2022   GLUCOSE 114 (H) 04/30/2022    Lab Results  Component Value Date   ALT 9 04/30/2022   AST 12 04/30/2022   ALKPHOS 53 04/30/2022   BILITOT 0.7 04/30/2022     Physical Exam: BP 136/80   Pulse 79   Ht 6\' 2"  (1.88 m)   Wt 242 lb (109.8 kg)   BMI 31.07 kg/m  Constitutional: Pleasant,well-developed, male in no acute distress. Neurological: Alert and oriented to person place and time. Psychiatric: Normal mood and affect. Behavior is normal.   ASSESSMENT: 77 y.o. male here for assessment of the following  1. Pancreatic cyst   2. History of iron deficiency   3. History of colon polyps   4. Constipation, unspecified constipation type   5. History of Helicobacter pylori infection    As above, had an incidental finding of pancreatic cyst during workup for IDA, noted on CT enterography a few years ago.  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  His case was discussed at multidisciplinary conference with Dr. Meridee Score, the group recommended interval imaging given this has grown over time, recommended MRCP 6 months from his last exam.  I discussed this recommendation with him and he understands, he wishes to proceed.  He does have some anxiety with MRI, will give him a dose of Valium prior to the MRI, he would like to go feet first into the MRI scanner as well which has helped him tolerate this in the past.  He understands our concern about enlargement of the cyst, and potential risk for precancerous or cancerous development, hopefully that risk is low given recent analysis.  We will also have him go to the lab for CA 19-9 level.  In regards to his iron deficiency, this has resolved with iron, extensive workup in the past for recurrent GI bleeds that were thought to be due to diverticulosis.  Last EGD was positive for H. pylori which was treated and eradicated.  I will check CBC and TIBC ferritin to make sure anemia has not recurred.  He has had some constipation on iron, recommend switching him from fiber to MiraLAX daily and he can titrate that up as needed.  Otherwise he is due for surveillance colonoscopy given history of TVA removed 3 years ago, roughly 1 cm in size.  I discussed colonoscopy with him, risks and benefits of the procedure and anesthesia.  Further recommendations pending results.  PLAN: - schedule MRCP for pancreatic cyst evaluation - give him Valium 5mg  PO to take prior to MRI - lab - CA 19-9 level, CBC, and TIBC / ferritin - stop fiber - change to Miralax daily and titrate as needed - colonoscopy at the  [facility] for history of polyps - hopefully may be his last that is needed if no high risk lesions / polyps  Harlin Rain, MD [facility]

## 2022-11-29 ENCOUNTER — Ambulatory Visit: Payer: Federal, State, Local not specified - PPO | Admitting: Gastroenterology

## 2022-11-29 ENCOUNTER — Other Ambulatory Visit (INDEPENDENT_AMBULATORY_CARE_PROVIDER_SITE_OTHER): Payer: Federal, State, Local not specified - PPO

## 2022-11-29 ENCOUNTER — Other Ambulatory Visit: Payer: Self-pay | Admitting: *Deleted

## 2022-11-29 ENCOUNTER — Encounter: Payer: Self-pay | Admitting: Gastroenterology

## 2022-11-29 VITALS — BP 136/80 | HR 79 | Ht 74.0 in | Wt 242.0 lb

## 2022-11-29 DIAGNOSIS — K862 Cyst of pancreas: Secondary | ICD-10-CM

## 2022-11-29 DIAGNOSIS — Z8639 Personal history of other endocrine, nutritional and metabolic disease: Secondary | ICD-10-CM | POA: Diagnosis not present

## 2022-11-29 DIAGNOSIS — Z86010 Personal history of colonic polyps: Secondary | ICD-10-CM

## 2022-11-29 DIAGNOSIS — D649 Anemia, unspecified: Secondary | ICD-10-CM

## 2022-11-29 DIAGNOSIS — Z8619 Personal history of other infectious and parasitic diseases: Secondary | ICD-10-CM

## 2022-11-29 DIAGNOSIS — K5900 Constipation, unspecified: Secondary | ICD-10-CM

## 2022-11-29 LAB — CBC WITH DIFFERENTIAL/PLATELET
Basophils Absolute: 0.1 10*3/uL (ref 0.0–0.1)
Basophils Relative: 1.1 % (ref 0.0–3.0)
Eosinophils Absolute: 0.1 10*3/uL (ref 0.0–0.7)
Eosinophils Relative: 2 % (ref 0.0–5.0)
HCT: 34 % — ABNORMAL LOW (ref 39.0–52.0)
Hemoglobin: 11.2 g/dL — ABNORMAL LOW (ref 13.0–17.0)
Lymphocytes Relative: 24.7 % (ref 12.0–46.0)
Lymphs Abs: 1.4 10*3/uL (ref 0.7–4.0)
MCHC: 33 g/dL (ref 30.0–36.0)
MCV: 91.5 fl (ref 78.0–100.0)
Monocytes Absolute: 0.5 10*3/uL (ref 0.1–1.0)
Monocytes Relative: 8.1 % (ref 3.0–12.0)
Neutro Abs: 3.6 10*3/uL (ref 1.4–7.7)
Neutrophils Relative %: 64.1 % (ref 43.0–77.0)
Platelets: 331 10*3/uL (ref 150.0–400.0)
RBC: 3.71 Mil/uL — ABNORMAL LOW (ref 4.22–5.81)
RDW: 14.6 % (ref 11.5–15.5)
WBC: 5.6 10*3/uL (ref 4.0–10.5)

## 2022-11-29 LAB — IBC + FERRITIN
Ferritin: 67.1 ng/mL (ref 22.0–322.0)
Iron: 88 ug/dL (ref 42–165)
Saturation Ratios: 29.5 % (ref 20.0–50.0)
TIBC: 298.2 ug/dL (ref 250.0–450.0)
Transferrin: 213 mg/dL (ref 212.0–360.0)

## 2022-11-29 MED ORDER — DIAZEPAM 5 MG PO TABS: 5.0000 mg | ORAL_TABLET | Freq: Once | ORAL | 0 refills | Status: AC

## 2022-11-29 MED ORDER — POLYETHYLENE GLYCOL 3350 17 G PO PACK: 17.0000 g | PACK | Freq: Every day | ORAL | Status: AC | PRN

## 2022-11-29 MED ORDER — PLENVU 140 G PO SOLR: 1.0000 | Freq: Once | ORAL | 0 refills | Status: AC

## 2022-11-29 NOTE — Patient Instructions (Addendum)
You have been scheduled for an MRCP/MRI at [facility] on Wed, 12-05-22. Your appointment time is 5:00PM. Please arrive to admitting (at main entrance of the hospital) 30 minutes prior to your appointment time for registration purposes. Please make certain not to have anything to eat or drink 6 hours prior to your test (nothing after 1:00pm). In addition, if you have any metal in your body, have a pacemaker or defibrillator, please be sure to let your ordering physician know. This test typically takes 45 minutes to 1 hour to complete. Should you need to reschedule, please call [phone] to do so.  We have sent the following medications to your pharmacy for you to pick up at your convenience: Valium 5 mg: Take 30 minutes prior to MRI (do not drive after taking)  Please go to the lab in the basement of our building to have lab work done as you leave today. Hit "B" for basement when you get on the elevator.  When the doors open the lab is on your left.  We will call you with the results. Thank you.  You have been scheduled for a colonoscopy. Please follow written instructions given to you at your visit today.  Please pick up your prep supplies at the pharmacy within the next 1-3 days. If you use inhalers (even only as needed), please bring them with you on the day of your procedure. BE SURE TO BRING THE MEDICARE COUPON CODES WITH YOU  DO NOT TAKE 7 DAYS PRIOR TO TEST- Trulicity (dulaglutide) Ozempic, Wegovy (semaglutide) Mounjaro (tirzepatide) Bydureon Bcise (exanatide extended release)  DO NOT TAKE 1 DAY PRIOR TO YOUR TEST Rybelsus (semaglutide) Adlyxin (lixisenatide) Victoza (liraglutide) Byetta (exanatide) ______________________________________________________________  Stop fiber.  Please purchase the following medications over the counter and take as directed: Miralax: take once a day (We are giving you a sample and coupon today)   Thank you for entrusting me with your care  and for choosing [facility], Dr. Ileene Patrick

## 2022-11-30 LAB — CANCER ANTIGEN 19-9: CA 19-9: <3 U/mL (ref <34)

## 2022-12-04 ENCOUNTER — Other Ambulatory Visit (INDEPENDENT_AMBULATORY_CARE_PROVIDER_SITE_OTHER): Payer: Federal, State, Local not specified - PPO

## 2022-12-04 DIAGNOSIS — Z8639 Personal history of other endocrine, nutritional and metabolic disease: Secondary | ICD-10-CM

## 2022-12-04 DIAGNOSIS — D649 Anemia, unspecified: Secondary | ICD-10-CM

## 2022-12-04 LAB — VITAMIN B12: Vitamin B-12: 199 pg/mL — ABNORMAL LOW (ref 211–911)

## 2022-12-04 LAB — TSH: TSH: 2.91 u[IU]/mL (ref 0.35–5.50)

## 2022-12-04 LAB — FOLATE: Folate: 11.8 ng/mL (ref >5.9)

## 2022-12-05 ENCOUNTER — Ambulatory Visit (HOSPITAL_COMMUNITY)
Admission: RE | Admit: 2022-12-05 | Discharge: 2022-12-05 | Disposition: A | Discharge status: Home / Self Care | Payer: Federal, State, Local not specified - PPO | Source: Ambulatory Visit | Attending: Gastroenterology | Admitting: Gastroenterology

## 2022-12-05 ENCOUNTER — Other Ambulatory Visit: Payer: Self-pay | Admitting: Gastroenterology

## 2022-12-05 ENCOUNTER — Other Ambulatory Visit: Payer: Self-pay

## 2022-12-05 DIAGNOSIS — E538 Deficiency of other specified B group vitamins: Secondary | ICD-10-CM

## 2022-12-05 DIAGNOSIS — K862 Cyst of pancreas: Secondary | ICD-10-CM

## 2022-12-05 DIAGNOSIS — Z8639 Personal history of other endocrine, nutritional and metabolic disease: Secondary | ICD-10-CM

## 2022-12-05 DIAGNOSIS — D649 Anemia, unspecified: Secondary | ICD-10-CM

## 2022-12-05 MED ORDER — B-12 1000 MCG PO CAPS: 1.0000 | ORAL_CAPSULE | Freq: Every day | ORAL | Status: AC

## 2022-12-05 MED ORDER — GADOBUTROL 1 MMOL/ML IV SOLN
10.0000 mL | Freq: Once | INTRAVENOUS | Status: AC | PRN
  Administered 2022-12-05: 10 mL via INTRAVENOUS

## 2022-12-13 ENCOUNTER — Other Ambulatory Visit: Payer: Self-pay | Admitting: Family Medicine

## 2022-12-15 ENCOUNTER — Other Ambulatory Visit: Payer: Self-pay | Admitting: Family Medicine

## 2022-12-15 DIAGNOSIS — I10 Essential (primary) hypertension: Secondary | ICD-10-CM

## 2022-12-27 ENCOUNTER — Encounter: Payer: Self-pay | Admitting: Gastroenterology

## 2023-01-04 ENCOUNTER — Encounter: Payer: Self-pay | Admitting: Family Medicine

## 2023-01-04 ENCOUNTER — Ambulatory Visit: Payer: Federal, State, Local not specified - PPO | Admitting: Family Medicine

## 2023-01-04 VITALS — BP 130/70 | HR 76 | Temp 98.0°F | Ht 74.0 in | Wt 237.6 lb

## 2023-01-04 DIAGNOSIS — Z794 Long term (current) use of insulin: Secondary | ICD-10-CM

## 2023-01-04 DIAGNOSIS — I10 Essential (primary) hypertension: Secondary | ICD-10-CM | POA: Diagnosis not present

## 2023-01-04 DIAGNOSIS — E785 Hyperlipidemia, unspecified: Secondary | ICD-10-CM

## 2023-01-04 DIAGNOSIS — E1165 Type 2 diabetes mellitus with hyperglycemia: Secondary | ICD-10-CM

## 2023-01-04 LAB — LIPID PANEL
Cholesterol: 141 mg/dL (ref 0–200)
HDL: 46.2 mg/dL (ref >39.00)
LDL Cholesterol: 79 mg/dL (ref 0–99)
NonHDL: 94.68
Total CHOL/HDL Ratio: 3
Triglycerides: 76 mg/dL (ref 0.0–149.0)
VLDL: 15.2 mg/dL (ref 0.0–40.0)

## 2023-01-04 LAB — COMPREHENSIVE METABOLIC PANEL
ALT: 11 U/L (ref 0–53)
AST: 14 U/L (ref 0–37)
Albumin: 4.2 g/dL (ref 3.5–5.2)
Alkaline Phosphatase: 48 U/L (ref 39–117)
BUN: 26 mg/dL — ABNORMAL HIGH (ref 6–23)
CO2: 26 mEq/L (ref 19–32)
Calcium: 9.5 mg/dL (ref 8.4–10.5)
Chloride: 104 mEq/L (ref 96–112)
Creatinine, Ser: 1.88 mg/dL — ABNORMAL HIGH (ref 0.40–1.50)
GFR: 34.26 mL/min — ABNORMAL LOW (ref >60.00)
Glucose, Bld: 93 mg/dL (ref 70–99)
Potassium: 4.2 mEq/L (ref 3.5–5.1)
Sodium: 139 mEq/L (ref 135–145)
Total Bilirubin: 0.5 mg/dL (ref 0.2–1.2)
Total Protein: 7.5 g/dL (ref 6.0–8.3)

## 2023-01-04 LAB — HEMOGLOBIN A1C: Hgb A1c MFr Bld: 6.4 % (ref 4.6–6.5)

## 2023-01-04 NOTE — Progress Notes (Signed)
Established Patient Office Visit  Subjective   Patient ID: Carl Lam, male    DOB: 01/14/1946  Age: 77 y.o. MRN: [id number]  Chief Complaint  Patient presents with   Medical Management of Chronic Issues    HPI   [name] is seen for medical follow-up.  He has been exercising more and going to the gym 5 days/week.  His weight is actually up a couple pounds by by his scale he states is down 4 pounds from last visit.  He states he feels "good ".  No specific complaints.  No chest pains.  Not monitoring blood pressures regularly at home.  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  Recent B12 was 199.  He is taking oral B12 1000 mcg daily.  Recent mild anemia by lab work  His other chronic problems include history of CVA, hypertension, type 2 diabetes, dyslipidemia.  He states he is compliant with medications.  Denies any headaches or chest pains.  No exercise intolerance.  Past Medical History:  Diagnosis Date   Acute lower GI bleeding 10/11/2011, 12-16-2019   Anemia    BPH (benign prostatic hyperplasia)    Colon polyp    CVA (cerebral vascular accident) (HCC) 12/14/2019   light stroke per pt gait is off a little per pt   DM type 2 (diabetes mellitus, type 2) (HCC) 10/27/2008   Dyspnea    sob with  heavy exertion   History of blood transfusion 12/16/2019   HYPERLIPIDEMIA 04/27/2009   Qualifier: Diagnosis of  By: Caryl Never MD, Kleigh Hoelzer     HYPERTENSION 10/27/2008   Qualifier: Diagnosis of  By: Gabriel Rung LPN, Nancy     Intermittent self-catheterization of bladder    daily   Wears dentures    Wears glasses    Past Surgical History:  Procedure Laterality Date   BIOPSY  05/07/2022   Procedure: BIOPSY;  Surgeon: Lemar Lofty., MD;  Location: [facility];  Service: Gastroenterology;;   COLONOSCOPY  10/12/2011   Procedure: COLONOSCOPY;  Surgeon: Hart Carwin, MD;  Location: [facility]OSCOPY;  Service: Endoscopy;  Laterality: N/A;   COLONOSCOPY WITH  PROPOFOL N/A 12/16/2019   Procedure: COLONOSCOPY WITH PROPOFOL;  Surgeon: Meryl Dare, MD;  Location: [facility] ENDOSCOPY;  Service: Endoscopy;  Laterality: N/A;   ESOPHAGOGASTRODUODENOSCOPY  10/12/2011   Procedure: ESOPHAGOGASTRODUODENOSCOPY (EGD);  Surgeon: Hart Carwin, MD;  Location: [facility] ENDOSCOPY;  Service: Endoscopy;  Laterality: N/A;   ESOPHAGOGASTRODUODENOSCOPY (EGD) WITH PROPOFOL N/A 12/16/2019   Procedure: ESOPHAGOGASTRODUODENOSCOPY (EGD) WITH PROPOFOL;  Surgeon: Meryl Dare, MD;  Location: [facility] ENDOSCOPY;  Service: Endoscopy;  Laterality: N/A;   ESOPHAGOGASTRODUODENOSCOPY (EGD) WITH PROPOFOL N/A 05/12/2020   Procedure: ESOPHAGOGASTRODUODENOSCOPY (EGD) WITH PROPOFOL;  Surgeon: Rachael Fee, MD;  Location: [facility] ENDOSCOPY;  Service: Endoscopy;  Laterality: N/A;   ESOPHAGOGASTRODUODENOSCOPY (EGD) WITH PROPOFOL N/A 10/05/2021   Procedure: ESOPHAGOGASTRODUODENOSCOPY (EGD) WITH PROPOFOL;  Surgeon: Rachael Fee, MD;  Location: [facility] ENDOSCOPY;  Service: Gastroenterology;  Laterality: N/A;   ESOPHAGOGASTRODUODENOSCOPY (EGD) WITH PROPOFOL N/A 05/07/2022   Procedure: ESOPHAGOGASTRODUODENOSCOPY (EGD) WITH PROPOFOL;  Surgeon: Meridee Score Netty Starring., MD;  Location: [facility] ENDOSCOPY;  Service: Gastroenterology;  Laterality: N/A;   EUS N/A 05/12/2020   Procedure: UPPER ENDOSCOPIC ULTRASOUND (EUS) RADIAL;  Surgeon: Rachael Fee, MD;  Location: [facility] ENDOSCOPY;  Service: Endoscopy;  Laterality: N/A;   EUS N/A 10/05/2021   Procedure: UPPER ENDOSCOPIC ULTRASOUND (EUS) RADIAL;  Surgeon: Rachael Fee, MD;  Location: [facility] ENDOSCOPY;  Service: Gastroenterology;  Laterality: N/A;   EUS N/A 10/05/2021   Procedure: UPPER ENDOSCOPIC ULTRASOUND (EUS) LINEAR;  Surgeon: Rachael Fee, MD;  Location: [facility] ENDOSCOPY;  Service: Gastroenterology;  Laterality: N/A;   EUS N/A 05/07/2022   Procedure: UPPER ENDOSCOPIC ULTRASOUND (EUS) RADIAL;  Surgeon: Lemar Lofty., MD;  Location: [facility] ENDOSCOPY;  Service:  Gastroenterology;  Laterality: N/A;   FINE NEEDLE ASPIRATION N/A 10/05/2021   Procedure: FINE NEEDLE ASPIRATION (FNA) LINEAR;  Surgeon: Rachael Fee, MD;  Location: [facility] ENDOSCOPY;  Service: Gastroenterology;  Laterality: N/A;   FINE NEEDLE ASPIRATION N/A 05/07/2022   Procedure: FINE NEEDLE ASPIRATION (FNA) LINEAR;  Surgeon: Lemar Lofty., MD;  Location: [facility] ENDOSCOPY;  Service: Gastroenterology;  Laterality: N/A;   HEMOSTASIS CLIP PLACEMENT  12/16/2019   Procedure: HEMOSTASIS CLIP PLACEMENT;  Surgeon: Meryl Dare, MD;  Location: [facility] ENDOSCOPY;  Service: Endoscopy;;   HERNIA REPAIR  2007   "navel"   JOINT REPLACEMENT     KNEE CARTILAGE SURGERY  1960's and 1970's   left; "2 total"   POLYPECTOMY  12/16/2019   Procedure: POLYPECTOMY;  Surgeon: Meryl Dare, MD;  Location: [facility] ENDOSCOPY;  Service: Endoscopy;;   TOTAL HIP ARTHROPLASTY Right 2007   TRANSURETHRAL RESECTION OF PROSTATE N/A 06/27/2020   Procedure: TRANSURETHRAL RESECTION OF THE PROSTATE (TURP);  Surgeon: Crista Elliot, MD;  Location: [facility];  Service: Urology;  Laterality: N/A;  REQUESTING 47 MINS FOR CASE    reports that he quit smoking about 44 years ago. His smoking use included cigarettes. He started smoking about 59 years ago. He has a 4.5 pack-year smoking history. He has never used smokeless tobacco. He reports that he does not currently use alcohol. He reports current drug use. Drug: Marijuana. family history includes Alcohol abuse in an other family member; Arthritis in an other family member; Diabetes in an other family member. No Known Allergies  Review of Systems  Constitutional:  Negative for malaise/fatigue and weight loss.  Eyes:  Negative for blurred vision.  Respiratory:  Negative for shortness of breath.   Cardiovascular:  Negative for chest pain.  Neurological:  Negative for dizziness, weakness and headaches.      Objective:     BP 130/70 (BP Location: Left Arm, Cuff  Size: Normal)   Pulse 76   Temp 98 F (36.7 C) (Oral)   Ht 6\' 2"  (1.88 m)   Wt 237 lb 9.6 oz (107.8 kg)   SpO2 97%   BMI 30.51 kg/m  BP Readings from Last 3 Encounters:  01/04/23 130/70  11/29/22 136/80  10/05/22 (!) 140/80   Wt Readings from Last 3 Encounters:  01/04/23 237 lb 9.6 oz (107.8 kg)  11/29/22 242 lb (109.8 kg)  10/05/22 235 lb 11.2 oz (106.9 kg)      Physical Exam Vitals reviewed.  Constitutional:      Appearance: He is well-developed.  Eyes:     Pupils: Pupils are equal, round, and reactive to light.  Neck:     Thyroid: No thyromegaly.  Cardiovascular:     Rate and Rhythm: Normal rate and regular rhythm.  Pulmonary:     Effort: Pulmonary effort is normal. No respiratory distress.     Breath sounds: Normal breath sounds. No wheezing or rales.  Musculoskeletal:     Cervical back: Neck supple.  Neurological:     Mental Status: He is alert and oriented to person, place, and time.      No results found  for any visits on 01/04/23.  Last CBC Lab Results  Component Value Date   WBC 5.6 11/29/2022   HGB 11.2 (L) 11/29/2022   HCT 34.0 (L) 11/29/2022   MCV 91.5 11/29/2022   MCH 28.2 06/23/2020   RDW 14.6 11/29/2022   PLT 331.0 11/29/2022   Last metabolic panel Lab Results  Component Value Date   GLUCOSE 114 (H) 04/30/2022   NA 142 04/30/2022   K 4.7 04/30/2022   CL 107 04/30/2022   CO2 26 04/30/2022   BUN 26 (H) 04/30/2022   CREATININE 1.71 (H) 04/30/2022   GFR 38.57 (L) 04/30/2022   CALCIUM 9.6 04/30/2022   PROT 7.4 04/30/2022   ALBUMIN 4.3 04/30/2022   BILITOT 0.7 04/30/2022   ALKPHOS 53 04/30/2022   AST 12 04/30/2022   ALT 9 04/30/2022   ANIONGAP 7 06/23/2020   Last lipids Lab Results  Component Value Date   CHOL 124 04/30/2022   HDL 38.00 (L) 04/30/2022   LDLCALC 72 04/30/2022   LDLDIRECT 168.2 07/11/2012   TRIG 68.0 04/30/2022   CHOLHDL 3 04/30/2022   Last hemoglobin A1c Lab Results  Component Value Date   HGBA1C 7.1 (A)  10/05/2022   Last vitamin B12 and Folate Lab Results  Component Value Date   VITAMINB12 199 (L) 12/04/2022   FOLATE 11.8 12/04/2022      The ASCVD Risk score (Arnett DK, et al., 2019) failed to calculate for the following reasons:   The patient has a prior MI or stroke diagnosis    Assessment & Plan:   Problem List Items Addressed This Visit       Unprioritized   Type 2 diabetes mellitus with hyperglycemia (HCC)   Relevant Orders   Hemoglobin A1c   Essential hypertension - Primary   Hyperlipidemia   Relevant Orders   Lipid panel   CMP  Hypertension with initial elevated reading here today 150/86 but improved substantially at follow-up after several minutes of rest to 130/70.  Continue to monitor closely.  Continue regular aerobic exercise and continued close monitoring  -Offered flu vaccine but he declines  -Check follow-up labs with lipid panel, CMP, A1c  Return in about 3 months (around 04/05/2023).    Evelena Peat, MD

## 2023-01-04 NOTE — Patient Instructions (Signed)
Continue to monitor blood pressure and be in touch if consistently > 140/90

## 2023-01-10 ENCOUNTER — Ambulatory Visit: Payer: Federal, State, Local not specified - PPO | Admitting: Gastroenterology

## 2023-01-10 ENCOUNTER — Encounter: Payer: Self-pay | Admitting: Gastroenterology

## 2023-01-10 VITALS — BP 116/74 | HR 68 | Temp 97.9°F | Resp 16 | Ht 74.0 in | Wt 242.0 lb

## 2023-01-10 DIAGNOSIS — D122 Benign neoplasm of ascending colon: Secondary | ICD-10-CM | POA: Diagnosis not present

## 2023-01-10 DIAGNOSIS — D123 Benign neoplasm of transverse colon: Secondary | ICD-10-CM

## 2023-01-10 DIAGNOSIS — D124 Benign neoplasm of descending colon: Secondary | ICD-10-CM

## 2023-01-10 DIAGNOSIS — Z09 Encounter for follow-up examination after completed treatment for conditions other than malignant neoplasm: Secondary | ICD-10-CM | POA: Diagnosis present

## 2023-01-10 DIAGNOSIS — Z86010 Personal history of colonic polyps: Secondary | ICD-10-CM

## 2023-01-10 DIAGNOSIS — D120 Benign neoplasm of cecum: Secondary | ICD-10-CM | POA: Diagnosis not present

## 2023-01-10 MED ORDER — SODIUM CHLORIDE 0.9 % IV SOLN: 500.0000 mL | Freq: Once | INTRAVENOUS | Status: DC

## 2023-01-10 NOTE — Op Note (Signed)
[facility] Patient Name: Carl Lam Procedure Date: 01/10/2023 8:38 AM MRN: [id number] Endoscopist: Viviann Spare P. Adela Lank , MD, [id number] Age: 77 Referring MD:  Date of Birth: 12/27/1945 Gender: Male Account #: [id number] Procedure:                Colonoscopy Indications:              High risk colon cancer surveillance: Personal                            history of colonic polyps - history of TVA 1cm in                            size removed 12/2019 Medicines:                Monitored Anesthesia Care Procedure:                Pre-Anesthesia Assessment:                           - Prior to the procedure, a History and Physical                            was performed, and patient medications and                            allergies were reviewed. The patient's tolerance of                            previous anesthesia was also reviewed. The risks                            and benefits of the procedure and the sedation                            options and risks were discussed with the patient.                            All questions were answered, and informed consent                            was obtained. Prior Anticoagulants: The patient has                            taken no anticoagulant or antiplatelet agents. ASA                            Grade Assessment: II - A patient with mild systemic                            disease. After reviewing the risks and benefits,                            the patient was deemed in satisfactory condition to  undergo the procedure.                           After obtaining informed consent, the colonoscope                            was passed under direct vision. Throughout the                            procedure, the patient's blood pressure, pulse, and                            oxygen saturations were monitored continuously. The                            Olympus CF-HQ190L ([id number])  Colonoscope was                            introduced through the anus and advanced to the the                            cecum, identified by appendiceal orifice and                            ileocecal valve. The colonoscopy was performed                            without difficulty. The patient tolerated the                            procedure well. The quality of the bowel                            preparation was adequate. The ileocecal valve,                            appendiceal orifice, and rectum were photographed. Scope In: 8:57:10 AM Scope Out: 9:19:07 AM Scope Withdrawal Time: 0 hours 13 minutes 47 seconds  Total Procedure Duration: 0 hours 21 minutes 57 seconds  Findings:                 The perianal and digital rectal examinations were                            normal.                           A 3 mm polyp was found in the cecum. The polyp was                            sessile. The polyp was removed with a cold snare.                            Resection and retrieval were complete.  A 3 mm polyp was found in the transverse colon. The                            polyp was sessile. The polyp was removed with a                            cold snare. Resection and retrieval were complete.                           A 3 mm polyp was found in the descending colon. The                            polyp was sessile. The polyp was removed with a                            cold snare. Resection and retrieval were complete.                           The colon long and redundant - abdominal pressure                            used to achieve cecal intubation.                           Many diverticula were found in the entire colon.                           Internal hemorrhoids were found during retroflexion.                           The exam was otherwise without abnormality.                            Retroflexed views of the rectum limited due to poor                             air retention in the left colon. Complications:            No immediate complications. Estimated blood loss:                            Minimal. Estimated Blood Loss:     Estimated blood loss was minimal. Impression:               - One 3 mm polyp in the cecum, removed with a cold                            snare. Resected and retrieved.                           - One 3 mm polyp in the transverse colon, removed  with a cold snare. Resected and retrieved.                           - One 3 mm polyp in the descending colon, removed                            with a cold snare. Resected and retrieved.                           - Long and redundant colon.                           - Diverticulosis in the entire examined colon.                           - Internal hemorrhoids.                           - The examination was otherwise normal. Recommendation:           - Patient has a contact number available for                            emergencies. The signs and symptoms of potential                            delayed complications were discussed with the                            patient. Return to normal activities tomorrow.                            Written discharge instructions were provided to the                            patient.                           - Resume previous diet.                           - Continue present medications.                           - Await pathology results.                           - Patient would not be due for next colonoscopy for                            5 years given this result - at that time he will be                            77 years old. In this light, likely no further  surveillance colonoscopy will be needed given no                            high risk lesions on this exam, as risks likely                            outweigh benefits at that time Willaim Rayas. Kinsie Belford, MD 01/10/2023 9:25:51 AM This report has been signed electronically.

## 2023-01-10 NOTE — Progress Notes (Signed)
Called to room to assist during endoscopic procedure.  Patient ID and intended procedure confirmed with present staff. Received instructions for my participation in the procedure from the performing physician.  

## 2023-01-10 NOTE — Patient Instructions (Signed)
Resume previous diet Continue present medications Await pathology results You will not need another screening colonoscopy, however, please call us at [phone] if you have a change in bowel habits, change in family history of colo-rectal cancer, rectal bleeding or other GI concern in the future!  Handouts/information given for polyps, diverticulosis and hemorrhoids  YOU HAD AN ENDOSCOPIC PROCEDURE TODAY AT THE [facility]:   Refer to the procedure report that was given to you for any specific questions about what was found during the examination.  If the procedure report does not answer your questions, please call your gastroenterologist to clarify.  If you requested that your care partner not be given the details of your procedure findings, then the procedure report has been included in a sealed envelope for you to review at your convenience later.  YOU SHOULD EXPECT: Some feelings of bloating in the abdomen. Passage of more gas than usual.  Walking can help get rid of the air that was put into your GI tract during the procedure and reduce the bloating. If you had a lower endoscopy (such as a colonoscopy or flexible sigmoidoscopy) you may notice spotting of blood in your stool or on the toilet paper. If you underwent a bowel prep for your procedure, you may not have a normal bowel movement for a few days.  Please Note:  You might notice some irritation and congestion in your nose or some drainage.  This is from the oxygen used during your procedure.  There is no need for concern and it should clear up in a day or so.  SYMPTOMS TO REPORT IMMEDIATELY:  Following lower endoscopy (colonoscopy):  Excessive amounts of blood in the stool  Significant tenderness or worsening of abdominal pains  Swelling of the abdomen that is new, acute  Fever of 100F or higher  For urgent or emergent issues, a gastroenterologist can be reached at any hour by calling [phone]. Do not use  MyChart messaging for urgent concerns.    DIET:  We do recommend a small meal at first, but then you may proceed to your regular diet.  Drink plenty of fluids but you should avoid alcoholic beverages for 24 hours.  ACTIVITY:  You should plan to take it easy for the rest of today and you should NOT DRIVE or use heavy machinery until tomorrow (because of the sedation medicines used during the test).    FOLLOW UP: Our staff will call the number listed on your records the next business day following your procedure.  We will call around 7:15- 8:00 am to check on you and address any questions or concerns that you may have regarding the information given to you following your procedure. If we do not reach you, we will leave a message.     If any biopsies were taken you will be contacted by phone or by letter within the next 1-3 weeks.  Please call us at [phone] if you have not heard about the biopsies in 3 weeks.    SIGNATURES/CONFIDENTIALITY: You and/or your care partner have signed paperwork which will be entered into your electronic medical record.  These signatures attest to the fact that that the information above on your After Visit Summary has been reviewed and is understood.  Full responsibility of the confidentiality of this discharge information lies with you and/or your care-partner.

## 2023-01-10 NOTE — Progress Notes (Signed)
Sedate, gd SR, tolerated procedure well, VSS, report to RN 

## 2023-01-10 NOTE — Progress Notes (Signed)
[facility] History and Physical   Primary Care Physician:  Carl Covey, MD   Reason for Procedure:   History of colon polyps  Plan:    colonoscopy     HPI: Carl Lam is a 77 y.o. male  here for colonoscopy surveillance - 1cm TVA removed 12/2019.   Patient denies any bowel symptoms at this time. No family history of colon cancer known. Otherwise feels well without any cardiopulmonary symptoms.   I have discussed risks / benefits of anesthesia and endoscopic procedure with [name] and they wish to proceed with the exams as outlined today.    Past Medical History:  Diagnosis Date   Acute lower GI bleeding 10/11/2011, 12-16-2019   Anemia    BPH (benign prostatic hyperplasia)    Cataract    Colon polyp    CVA (cerebral vascular accident) (HCC) 12/14/2019   light stroke per pt gait is off a little per pt   DM type 2 (diabetes mellitus, type 2) (HCC) 10/27/2008   Dyspnea    sob with  heavy exertion   History of blood transfusion 12/16/2019   HYPERLIPIDEMIA 04/27/2009   Qualifier: Diagnosis of  By: Caryl Never MD, Bruce     HYPERTENSION 10/27/2008   Qualifier: Diagnosis of  By: Gabriel Rung LPN, Nancy     Intermittent self-catheterization of bladder    daily   Wears dentures    Wears glasses     Past Surgical History:  Procedure Laterality Date   BIOPSY  05/07/2022   Procedure: BIOPSY;  Surgeon: Lemar Lofty., MD;  Location: [facility] ENDOSCOPY;  Service: Gastroenterology;;   CATARACT EXTRACTION Bilateral    COLONOSCOPY  10/12/2011   Procedure: COLONOSCOPY;  Surgeon: Hart Carwin, MD;  Location: [facility] ENDOSCOPY;  Service: Endoscopy;  Laterality: N/A;   COLONOSCOPY WITH PROPOFOL N/A 12/16/2019   Procedure: COLONOSCOPY WITH PROPOFOL;  Surgeon: Meryl Dare, MD;  Location: [facility] ENDOSCOPY;  Service: Endoscopy;  Laterality: N/A;   ESOPHAGOGASTRODUODENOSCOPY  10/12/2011   Procedure: ESOPHAGOGASTRODUODENOSCOPY (EGD);  Surgeon: Hart Carwin, MD;   Location: [facility] ENDOSCOPY;  Service: Endoscopy;  Laterality: N/A;   ESOPHAGOGASTRODUODENOSCOPY (EGD) WITH PROPOFOL N/A 12/16/2019   Procedure: ESOPHAGOGASTRODUODENOSCOPY (EGD) WITH PROPOFOL;  Surgeon: Meryl Dare, MD;  Location: [facility] ENDOSCOPY;  Service: Endoscopy;  Laterality: N/A;   ESOPHAGOGASTRODUODENOSCOPY (EGD) WITH PROPOFOL N/A 05/12/2020   Procedure: ESOPHAGOGASTRODUODENOSCOPY (EGD) WITH PROPOFOL;  Surgeon: Rachael Fee, MD;  Location: [facility] ENDOSCOPY;  Service: Endoscopy;  Laterality: N/A;   ESOPHAGOGASTRODUODENOSCOPY (EGD) WITH PROPOFOL N/A 10/05/2021   Procedure: ESOPHAGOGASTRODUODENOSCOPY (EGD) WITH PROPOFOL;  Surgeon: Rachael Fee, MD;  Location: [facility] ENDOSCOPY;  Service: Gastroenterology;  Laterality: N/A;   ESOPHAGOGASTRODUODENOSCOPY (EGD) WITH PROPOFOL N/A 05/07/2022   Procedure: ESOPHAGOGASTRODUODENOSCOPY (EGD) WITH PROPOFOL;  Surgeon: Meridee Score Netty Starring., MD;  Location: [facility] ENDOSCOPY;  Service: Gastroenterology;  Laterality: N/A;   EUS N/A 05/12/2020   Procedure: UPPER ENDOSCOPIC ULTRASOUND (EUS) RADIAL;  Surgeon: Rachael Fee, MD;  Location: [facility] ENDOSCOPY;  Service: Endoscopy;  Laterality: N/A;   EUS N/A 10/05/2021   Procedure: UPPER ENDOSCOPIC ULTRASOUND (EUS) RADIAL;  Surgeon: Rachael Fee, MD;  Location: [facility] ENDOSCOPY;  Service: Gastroenterology;  Laterality: N/A;   EUS N/A 10/05/2021   Procedure: UPPER ENDOSCOPIC ULTRASOUND (EUS) LINEAR;  Surgeon: Rachael Fee, MD;  Location: [facility] ENDOSCOPY;  Service: Gastroenterology;  Laterality: N/A;   EUS N/A 05/07/2022   Procedure: UPPER ENDOSCOPIC ULTRASOUND (EUS) RADIAL;  Surgeon: Meridee Score Netty Starring., MD;  Location: [facility] ENDOSCOPY;  Service: Gastroenterology;  Laterality: N/A;   FINE NEEDLE ASPIRATION N/A 10/05/2021   Procedure: FINE NEEDLE ASPIRATION (FNA) LINEAR;  Surgeon: Rachael Fee, MD;  Location: [facility] ENDOSCOPY;  Service: Gastroenterology;  Laterality: N/A;   FINE NEEDLE ASPIRATION N/A 05/07/2022   Procedure:  FINE NEEDLE ASPIRATION (FNA) LINEAR;  Surgeon: Lemar Lofty., MD;  Location: [facility] ENDOSCOPY;  Service: Gastroenterology;  Laterality: N/A;   HEMOSTASIS CLIP PLACEMENT  12/16/2019   Procedure: HEMOSTASIS CLIP PLACEMENT;  Surgeon: Meryl Dare, MD;  Location: [facility] ENDOSCOPY;  Service: Endoscopy;;   HERNIA REPAIR  2007   "navel"   JOINT REPLACEMENT     KNEE CARTILAGE SURGERY  1960's and 1970's   left; "2 total"   POLYPECTOMY  12/16/2019   Procedure: POLYPECTOMY;  Surgeon: Meryl Dare, MD;  Location: [facility] ENDOSCOPY;  Service: Endoscopy;;   TOTAL HIP ARTHROPLASTY Right 2007   TRANSURETHRAL RESECTION OF PROSTATE N/A 06/27/2020   Procedure: TRANSURETHRAL RESECTION OF THE PROSTATE (TURP);  Surgeon: Crista Elliot, MD;  Location: [facility];  Service: Urology;  Laterality: N/A;  REQUESTING 63 MINS FOR CASE    Prior to Admission medications   Medication Sig Start Date End Date Taking? Authorizing Provider  diazepam (VALIUM) 5 MG tablet Take 5 mg by mouth once. 11/29/22  Yes [provider]  amLODipine (NORVASC) 10 MG tablet Take 1 tablet by mouth once daily 10/08/22   Burchette, Elberta Fortis, MD  atorvastatin (LIPITOR) 20 MG tablet Take 1 tablet by mouth once daily 12/18/22   Burchette, Elberta Fortis, MD  carboxymethylcellul-glycerin (EQ LUBRICATING EYE DROPS) 0.5-0.9 % ophthalmic solution Place 1 drop into both eyes daily.    [provider]  chlorthalidone (HYGROTON) 25 MG tablet Take 1 tablet by mouth once daily 08/21/22   Burchette, Elberta Fortis, MD  Cyanocobalamin (B-12) 1000 MCG CAPS Take 1 capsule by mouth daily. 12/05/22   Delshawn Stech, Willaim Rayas, MD  ferrous sulfate 325 (65 FE) MG EC tablet Take 325 mg by mouth every evening.    [provider]  gabapentin (NEURONTIN) 100 MG capsule Take 2 capsules by mouth twice daily 10/24/22   Burchette, Elberta Fortis, MD  Insulin [facility]) 100 UNIT/ML INJECT 30 UNITS SUBCUTANEOUSLY ONCE DAILY AT [facility] 12/13/22    Burchette, Elberta Fortis, MD  lisinopril (ZESTRIL) 20 MG tablet Take 1 tablet by mouth once daily 11/05/22   Burchette, Elberta Fortis, MD  metFORMIN (GLUCOPHAGE) 500 MG tablet Take 2 tablets by mouth twice daily 10/08/22   Burchette, Elberta Fortis, MD  omeprazole (PRILOSEC) 20 MG capsule Take 1 capsule (20 mg total) by mouth daily. Take 30 minutes before a meal. 05/07/22   Mansouraty, Netty Starring., MD  polyethylene glycol (MIRALAX) 17 g packet Take 17 g by mouth daily as needed. 11/29/22   Taber Sweetser, Willaim Rayas, MD    Current Outpatient Medications  Medication Sig Dispense Refill   diazepam (VALIUM) 5 MG tablet Take 5 mg by mouth once.     amLODipine (NORVASC) 10 MG tablet Take 1 tablet by mouth once daily 90 tablet 1   atorvastatin (LIPITOR) 20 MG tablet Take 1 tablet by mouth once daily 90 tablet 0   carboxymethylcellul-glycerin (EQ LUBRICATING EYE DROPS) 0.5-0.9 % ophthalmic solution Place 1 drop into both eyes daily.     chlorthalidone (HYGROTON) 25 MG tablet Take 1 tablet by mouth once daily 90 tablet 1   Cyanocobalamin (B-12) 1000 MCG CAPS Take 1 capsule by mouth daily.  ferrous sulfate 325 (65 FE) MG EC tablet Take 325 mg by mouth every evening.     gabapentin (NEURONTIN) 100 MG capsule Take 2 capsules by mouth twice daily 120 capsule 0   Insulin Glargine (BASAGLAR KWIKPEN) 100 UNIT/ML INJECT 30 UNITS SUBCUTANEOUSLY ONCE DAILY AT NOON 15 mL 2   lisinopril (ZESTRIL) 20 MG tablet Take 1 tablet by mouth once daily 90 tablet 0   metFORMIN (GLUCOPHAGE) 500 MG tablet Take 2 tablets by mouth twice daily 360 tablet 1   omeprazole (PRILOSEC) 20 MG capsule Take 1 capsule (20 mg total) by mouth daily. Take 30 minutes before a meal. 30 capsule 6   polyethylene glycol (MIRALAX) 17 g packet Take 17 g by mouth daily as needed.     Current Facility-Administered Medications  Medication Dose Route Frequency Provider Last Rate Last Admin   0.9 %  sodium chloride infusion  500 mL Intravenous Once Nichol Ator, Willaim Rayas, MD         Allergies as of 01/10/2023   (No Known Allergies)    Family History  Problem Relation Age of Onset   Alcohol abuse Other        grandparent   Arthritis Other        arthritis   Diabetes Other        grandparent   Colon cancer Neg Hx    Esophageal cancer Neg Hx    Rectal cancer Neg Hx    Stomach cancer Neg Hx     Social History   Socioeconomic History   Marital status: Married    Spouse name: Not on file   Number of children: Not on file   Years of education: Not on file   Highest education level: Not on file  Occupational History   Not on file  Tobacco Use   Smoking status: Former    Current packs/day: 0.00    Average packs/day: 0.3 packs/day for 15.0 years (4.5 ttl pk-yrs)    Types: Cigarettes    Start date: 10/11/1963    Quit date: 10/11/1978    Years since quitting: 44.2   Smokeless tobacco: Never  Vaping Use   Vaping status: Never Used  Substance and Sexual Activity   Alcohol use: Not Currently    Comment: 10/11/11 "last alcohol 27-28 years ago"   Drug use: Yes    Types: Marijuana    Comment: "recreational marijuana in [employer]; 1970's"   Sexual activity: Not Currently  Other Topics Concern   Not on file  Social History Narrative   Not on file   Social Determinants of Health   Financial Resource Strain: Not on file  Food Insecurity: Not on file  Transportation Needs: Not on file  Physical Activity: Not on file  Stress: Not on file  Social Connections: Not on file  Intimate Partner Violence: Not on file    Review of Systems: All other review of systems negative except as mentioned in the HPI.  Physical Exam: Vital signs BP (!) 144/86   Pulse 75   Temp 97.9 F (36.6 C) (Temporal)   Ht 6\' 2"  (1.88 m)   Wt 242 lb (109.8 kg)   SpO2 98%   BMI 31.07 kg/m   General:   Alert,  Well-developed, pleasant and cooperative in NAD Lungs:  Clear throughout to auscultation.   Heart:  Regular rate and rhythm Abdomen:  Soft, nontender and  nondistended.   Neuro/Psych:  Alert and cooperative. Normal mood and affect. A and O x 3  Harlin Rain, MD [facility]

## 2023-01-11 ENCOUNTER — Telehealth: Payer: Self-pay

## 2023-01-11 NOTE — Telephone Encounter (Signed)
  Follow up Call-     01/10/2023    8:36 AM  Call back number  Post procedure Call Back phone  # [phone]  Permission to leave phone message Yes     Patient questions:  Do you have a fever, pain , or abdominal swelling? No. Pain Score  0 *  Have you tolerated food without any problems? Yes.    Have you been able to return to your normal activities? Yes.    Do you have any questions about your discharge instructions: Diet   No. Medications  No. Follow up visit  No.  Do you have questions or concerns about your Care? No.  Actions: * If pain score is 4 or above: No action needed, pain <4.

## 2023-01-15 ENCOUNTER — Encounter: Payer: Self-pay | Admitting: Gastroenterology

## 2023-01-15 LAB — SURGICAL PATHOLOGY

## 2023-01-23 NOTE — Progress Notes (Signed)
Left message reminding patient that he is due for labwork this week or next. Can come to our lab at his convenience.

## 2023-01-29 ENCOUNTER — Other Ambulatory Visit (INDEPENDENT_AMBULATORY_CARE_PROVIDER_SITE_OTHER): Payer: Federal, State, Local not specified - PPO

## 2023-01-29 DIAGNOSIS — D649 Anemia, unspecified: Secondary | ICD-10-CM

## 2023-01-29 DIAGNOSIS — Z8639 Personal history of other endocrine, nutritional and metabolic disease: Secondary | ICD-10-CM | POA: Diagnosis not present

## 2023-01-29 DIAGNOSIS — E538 Deficiency of other specified B group vitamins: Secondary | ICD-10-CM

## 2023-01-29 LAB — CBC WITH DIFFERENTIAL/PLATELET
Basophils Absolute: 0.1 10*3/uL (ref 0.0–0.1)
Basophils Relative: 0.7 % (ref 0.0–3.0)
Eosinophils Absolute: 0.1 10*3/uL (ref 0.0–0.7)
Eosinophils Relative: 1.1 % (ref 0.0–5.0)
HCT: 38 % — ABNORMAL LOW (ref 39.0–52.0)
Hemoglobin: 12.1 g/dL — ABNORMAL LOW (ref 13.0–17.0)
Lymphocytes Relative: 14.2 % (ref 12.0–46.0)
Lymphs Abs: 1.2 10*3/uL (ref 0.7–4.0)
MCHC: 31.9 g/dL (ref 30.0–36.0)
MCV: 91.2 fL (ref 78.0–100.0)
Monocytes Absolute: 0.5 10*3/uL (ref 0.1–1.0)
Monocytes Relative: 6.4 % (ref 3.0–12.0)
Neutro Abs: 6.6 10*3/uL (ref 1.4–7.7)
Neutrophils Relative %: 77.6 % — ABNORMAL HIGH (ref 43.0–77.0)
Platelets: 282 10*3/uL (ref 150.0–400.0)
RBC: 4.16 Mil/uL — ABNORMAL LOW (ref 4.22–5.81)
RDW: 15.2 % (ref 11.5–15.5)
WBC: 8.5 10*3/uL (ref 4.0–10.5)

## 2023-01-29 LAB — VITAMIN B12: Vitamin B-12: 501 pg/mL (ref 211–911)

## 2023-02-04 ENCOUNTER — Other Ambulatory Visit: Payer: Self-pay | Admitting: Family Medicine

## 2023-02-23 ENCOUNTER — Other Ambulatory Visit: Payer: Self-pay | Admitting: Family Medicine

## 2023-02-23 DIAGNOSIS — I10 Essential (primary) hypertension: Secondary | ICD-10-CM

## 2023-03-18 ENCOUNTER — Other Ambulatory Visit: Payer: Self-pay | Admitting: Family Medicine

## 2023-03-18 DIAGNOSIS — I10 Essential (primary) hypertension: Secondary | ICD-10-CM

## 2023-04-03 ENCOUNTER — Ambulatory Visit: Payer: Federal, State, Local not specified - PPO | Admitting: Family Medicine

## 2023-04-03 ENCOUNTER — Encounter: Payer: Self-pay | Admitting: Family Medicine

## 2023-04-03 VITALS — BP 144/88 | HR 79 | Temp 98.3°F | Ht 74.0 in | Wt 241.3 lb

## 2023-04-03 DIAGNOSIS — N1832 Chronic kidney disease, stage 3b: Secondary | ICD-10-CM

## 2023-04-03 DIAGNOSIS — Z794 Long term (current) use of insulin: Secondary | ICD-10-CM

## 2023-04-03 DIAGNOSIS — I10 Essential (primary) hypertension: Secondary | ICD-10-CM | POA: Diagnosis not present

## 2023-04-03 DIAGNOSIS — E1165 Type 2 diabetes mellitus with hyperglycemia: Secondary | ICD-10-CM

## 2023-04-03 DIAGNOSIS — R944 Abnormal results of kidney function studies: Secondary | ICD-10-CM

## 2023-04-03 DIAGNOSIS — N183 Chronic kidney disease, stage 3 unspecified: Secondary | ICD-10-CM | POA: Insufficient documentation

## 2023-04-03 LAB — BASIC METABOLIC PANEL
BUN: 31 mg/dL — ABNORMAL HIGH (ref 6–23)
CO2: 27 meq/L (ref 19–32)
Calcium: 9.3 mg/dL (ref 8.4–10.5)
Chloride: 106 meq/L (ref 96–112)
Creatinine, Ser: 2.1 mg/dL — ABNORMAL HIGH (ref 0.40–1.50)
GFR: 29.95 mL/min — ABNORMAL LOW (ref >60.00)
Glucose, Bld: 123 mg/dL — ABNORMAL HIGH (ref 70–99)
Potassium: 4.2 meq/L (ref 3.5–5.1)
Sodium: 140 meq/L (ref 135–145)

## 2023-04-03 LAB — HEMOGLOBIN A1C: Hgb A1c MFr Bld: 7.5 % — ABNORMAL HIGH (ref 4.6–6.5)

## 2023-04-03 MED ORDER — BASAGLAR KWIKPEN 100 UNIT/ML ~~LOC~~ SOPN: PEN_INJECTOR | SUBCUTANEOUS | 2 refills | Status: DC

## 2023-04-03 NOTE — Progress Notes (Signed)
Established Patient Office Visit  Subjective   Patient ID: Carl Lam, male    DOB: 1945-07-03  Age: 77 y.o. MRN: [id number]  Chief Complaint  Patient presents with   Medical Management of Chronic Issues    HPI   Mr Kindschi is seen for medical follow-up.  He has history of type 2 diabetes, hypertension, hyperlipidemia, history of CVA.  Recent A1c 6.4%.  He had recent labs 3 months ago with GFR 34 which is a decline.  He is trying to stay well-hydrated.  Blood pressure generally controlled on regimen including chlorthalidone, lisinopril, amlodipine but is up somewhat today.  He ate some fried fish last night and thinks he may have eaten more sodium than usual.  He states he is compliant with medications  Had recent B12 level 199.  Now taking daily oral replacement and had follow-up level 501.  He has resumed some exercise and is using exercise bike about 4 days/week  Past Medical History:  Diagnosis Date   Acute lower GI bleeding 10/11/2011, 12-16-2019   Anemia    BPH (benign prostatic hyperplasia)    Cataract    Colon polyp    CVA (cerebral vascular accident) (HCC) 12/14/2019   light stroke per pt gait is off a little per pt   DM type 2 (diabetes mellitus, type 2) (HCC) 10/27/2008   Dyspnea    sob with  heavy exertion   History of blood transfusion 12/16/2019   HYPERLIPIDEMIA 04/27/2009   Qualifier: Diagnosis of  By: Caryl Never MD, Krysten Veronica     HYPERTENSION 10/27/2008   Qualifier: Diagnosis of  By: Gabriel Rung LPN, Nancy     Intermittent self-catheterization of bladder    daily   Wears dentures    Wears glasses    Past Surgical History:  Procedure Laterality Date   BIOPSY  05/07/2022   Procedure: BIOPSY;  Surgeon: Lemar Lofty., MD;  Location: [facility] ENDOSCOPY;  Service: Gastroenterology;;   CATARACT EXTRACTION Bilateral    COLONOSCOPY  10/12/2011   Procedure: COLONOSCOPY;  Surgeon: Hart Carwin, MD;  Location: [facility] ENDOSCOPY;  Service: Endoscopy;  Laterality: N/A;    COLONOSCOPY WITH PROPOFOL N/A 12/16/2019   Procedure: COLONOSCOPY WITH PROPOFOL;  Surgeon: Meryl Dare, MD;  Location: [facility] ENDOSCOPY;  Service: Endoscopy;  Laterality: N/A;   ESOPHAGOGASTRODUODENOSCOPY  10/12/2011   Procedure: ESOPHAGOGASTRODUODENOSCOPY (EGD);  Surgeon: Hart Carwin, MD;  Location: [facility] ENDOSCOPY;  Service: Endoscopy;  Laterality: N/A;   ESOPHAGOGASTRODUODENOSCOPY (EGD) WITH PROPOFOL N/A 12/16/2019   Procedure: ESOPHAGOGASTRODUODENOSCOPY (EGD) WITH PROPOFOL;  Surgeon: Meryl Dare, MD;  Location: [facility] ENDOSCOPY;  Service: Endoscopy;  Laterality: N/A;   ESOPHAGOGASTRODUODENOSCOPY (EGD) WITH PROPOFOL N/A 05/12/2020   Procedure: ESOPHAGOGASTRODUODENOSCOPY (EGD) WITH PROPOFOL;  Surgeon: Rachael Fee, MD;  Location: [facility] ENDOSCOPY;  Service: Endoscopy;  Laterality: N/A;   ESOPHAGOGASTRODUODENOSCOPY (EGD) WITH PROPOFOL N/A 10/05/2021   Procedure: ESOPHAGOGASTRODUODENOSCOPY (EGD) WITH PROPOFOL;  Surgeon: Rachael Fee, MD;  Location: [facility] ENDOSCOPY;  Service: Gastroenterology;  Laterality: N/A;   ESOPHAGOGASTRODUODENOSCOPY (EGD) WITH PROPOFOL N/A 05/07/2022   Procedure: ESOPHAGOGASTRODUODENOSCOPY (EGD) WITH PROPOFOL;  Surgeon: Meridee Score Netty Starring., MD;  Location: [facility] ENDOSCOPY;  Service: Gastroenterology;  Laterality: N/A;   EUS N/A 05/12/2020   Procedure: UPPER ENDOSCOPIC ULTRASOUND (EUS) RADIAL;  Surgeon: Rachael Fee, MD;  Location: [facility] ENDOSCOPY;  Service: Endoscopy;  Laterality: N/A;   EUS N/A 10/05/2021   Procedure: UPPER ENDOSCOPIC ULTRASOUND (EUS) RADIAL;  Surgeon: Rachael Fee, MD;  Location: [facility] ENDOSCOPY;  Service: Gastroenterology;  Laterality: N/A;   EUS N/A 10/05/2021   Procedure: UPPER ENDOSCOPIC ULTRASOUND (EUS) LINEAR;  Surgeon: Rachael Fee, MD;  Location: [facility] ENDOSCOPY;  Service: Gastroenterology;  Laterality: N/A;   EUS N/A 05/07/2022   Procedure: UPPER ENDOSCOPIC ULTRASOUND (EUS) RADIAL;  Surgeon: Lemar Lofty., MD;  Location: [facility]  ENDOSCOPY;  Service: Gastroenterology;  Laterality: N/A;   FINE NEEDLE ASPIRATION N/A 10/05/2021   Procedure: FINE NEEDLE ASPIRATION (FNA) LINEAR;  Surgeon: Rachael Fee, MD;  Location: [facility] ENDOSCOPY;  Service: Gastroenterology;  Laterality: N/A;   FINE NEEDLE ASPIRATION N/A 05/07/2022   Procedure: FINE NEEDLE ASPIRATION (FNA) LINEAR;  Surgeon: Lemar Lofty., MD;  Location: [facility] ENDOSCOPY;  Service: Gastroenterology;  Laterality: N/A;   HEMOSTASIS CLIP PLACEMENT  12/16/2019   Procedure: HEMOSTASIS CLIP PLACEMENT;  Surgeon: Meryl Dare, MD;  Location: [facility] ENDOSCOPY;  Service: Endoscopy;;   HERNIA REPAIR  2007   "navel"   JOINT REPLACEMENT     KNEE CARTILAGE SURGERY  1960's and 1970's   left; "2 total"   POLYPECTOMY  12/16/2019   Procedure: POLYPECTOMY;  Surgeon: Meryl Dare, MD;  Location: [facility] ENDOSCOPY;  Service: Endoscopy;;   TOTAL HIP ARTHROPLASTY Right 2007   TRANSURETHRAL RESECTION OF PROSTATE N/A 06/27/2020   Procedure: TRANSURETHRAL RESECTION OF THE PROSTATE (TURP);  Surgeon: Crista Elliot, MD;  Location: [facility];  Service: Urology;  Laterality: N/A;  REQUESTING 29 MINS FOR CASE    reports that he quit smoking about 44 years ago. His smoking use included cigarettes. He started smoking about 59 years ago. He has a 4.5 pack-year smoking history. He has never used smokeless tobacco. He reports that he does not currently use alcohol. He reports current drug use. Drug: Marijuana. family history includes Alcohol abuse in an other family member; Arthritis in an other family member; Diabetes in an other family member. No Known Allergies  Review of Systems  Constitutional:  Negative for malaise/fatigue.  Eyes:  Negative for blurred vision.  Respiratory:  Negative for shortness of breath.   Cardiovascular:  Negative for chest pain.  Neurological:  Negative for dizziness, weakness and headaches.      Objective:     BP (!) 156/84 (BP Location:  Left Arm, Patient Position: Sitting, Cuff Size: Normal)   Pulse 79   Temp 98.3 F (36.8 C) (Oral)   Ht 6\' 2"  (1.88 m)   Wt 241 lb 4.8 oz (109.5 kg)   SpO2 98%   BMI 30.98 kg/m  BP Readings from Last 3 Encounters:  04/03/23 (!) 144/88  01/10/23 116/74  01/04/23 130/70   Wt Readings from Last 3 Encounters:  04/03/23 241 lb 4.8 oz (109.5 kg)  01/10/23 242 lb (109.8 kg)  01/04/23 237 lb 9.6 oz (107.8 kg)      Physical Exam Vitals reviewed.  Constitutional:      Appearance: He is well-developed.  HENT:     Right Ear: External ear normal.     Left Ear: External ear normal.  Eyes:     Pupils: Pupils are equal, round, and reactive to light.  Neck:     Thyroid: No thyromegaly.  Cardiovascular:     Rate and Rhythm: Normal rate and regular rhythm.  Pulmonary:     Effort: Pulmonary effort is normal. No respiratory distress.     Breath sounds: Normal breath sounds. No wheezing or rales.  Musculoskeletal:     Cervical back: Neck supple.     Right lower leg: No  edema.     Left lower leg: No edema.  Neurological:     Mental Status: He is alert and oriented to person, place, and time.      No results found for any visits on 04/03/23.  Last CBC Lab Results  Component Value Date   WBC 8.5 01/29/2023   HGB 12.1 (L) 01/29/2023   HCT 38.0 (L) 01/29/2023   MCV 91.2 01/29/2023   MCH 28.2 06/23/2020   RDW 15.2 01/29/2023   PLT 282.0 01/29/2023   Last metabolic panel Lab Results  Component Value Date   GLUCOSE 93 01/04/2023   NA 139 01/04/2023   K 4.2 01/04/2023   CL 104 01/04/2023   CO2 26 01/04/2023   BUN 26 (H) 01/04/2023   CREATININE 1.88 (H) 01/04/2023   GFR 34.26 (L) 01/04/2023   CALCIUM 9.5 01/04/2023   PROT 7.5 01/04/2023   ALBUMIN 4.2 01/04/2023   BILITOT 0.5 01/04/2023   ALKPHOS 48 01/04/2023   AST 14 01/04/2023   ALT 11 01/04/2023   ANIONGAP 7 06/23/2020   Last lipids Lab Results  Component Value Date   CHOL 141 01/04/2023   HDL 46.20 01/04/2023    LDLCALC 79 01/04/2023   LDLDIRECT 168.2 07/11/2012   TRIG 76.0 01/04/2023   CHOLHDL 3 01/04/2023   Last hemoglobin A1c Lab Results  Component Value Date   HGBA1C 6.4 01/04/2023   Last vitamin B12 and Folate Lab Results  Component Value Date   VITAMINB12 501 01/29/2023   FOLATE 11.8 12/04/2022      The ASCVD Risk score (Arnett DK, et al., 2019) failed to calculate for the following reasons:   Risk score cannot be calculated because patient has a medical history suggesting prior/existing ASCVD    Assessment & Plan:   Problem List Items Addressed This Visit       Unprioritized   CKD (chronic kidney disease) stage 3, GFR 30-59 ml/min (HCC)   Relevant Orders   Basic metabolic panel   Type 2 diabetes mellitus with hyperglycemia (HCC)   Relevant Orders   Hemoglobin A1c   Essential hypertension - Primary  77 year old male with chronic problems as above and also past history of CVA.  Blood pressure today not to goal.  He ate a considerable amount of sodium presumably last night.  We recommend close home monitoring and be in touch if not consistently less than 130/80.  Handout on DASH diet given  -Recheck labs including A1c and BMP -Discontinue metformin if GFR has declined any further -Consider nephrology consult if GFR has declined -Discussed adequate hydration and avoidance of all non-steroidals -Continue regular exercise habits  Return in about 3 months (around 07/02/2023).    Evelena Peat, MD

## 2023-04-03 NOTE — Patient Instructions (Signed)
Monitor blood pressure at home and be in touch if consistently > 130/80.

## 2023-04-03 NOTE — Addendum Note (Signed)
Addended by: Christy Sartorius on: 04/03/2023 01:48 PM   Modules accepted: Orders

## 2023-04-03 NOTE — Addendum Note (Signed)
Addended by: Christy Sartorius on: 04/03/2023 01:45 PM   Modules accepted: Orders

## 2023-04-03 NOTE — Addendum Note (Signed)
Addended by: Christy Sartorius on: 04/03/2023 01:27 PM   Modules accepted: Orders

## 2023-04-05 ENCOUNTER — Ambulatory Visit: Payer: Federal, State, Local not specified - PPO | Admitting: Family Medicine

## 2023-04-07 ENCOUNTER — Other Ambulatory Visit: Payer: Self-pay | Admitting: Family Medicine

## 2023-04-25 ENCOUNTER — Telehealth: Payer: Self-pay

## 2023-04-25 DIAGNOSIS — D509 Iron deficiency anemia, unspecified: Secondary | ICD-10-CM

## 2023-04-25 NOTE — Telephone Encounter (Signed)
-----   Message from [facility] Marylu Lund H sent at 01/29/2023  5:16 PM EDT ----- Regarding: cbc due in january Due for CBC in mid January

## 2023-04-25 NOTE — Telephone Encounter (Signed)
 Order entered.  Called and left detailed message for patient to go to the lab one day next week.

## 2023-04-30 ENCOUNTER — Other Ambulatory Visit: Payer: Self-pay | Admitting: Nephrology

## 2023-04-30 DIAGNOSIS — N184 Chronic kidney disease, stage 4 (severe): Secondary | ICD-10-CM

## 2023-05-04 ENCOUNTER — Other Ambulatory Visit: Payer: Self-pay | Admitting: Family Medicine

## 2023-05-07 ENCOUNTER — Ambulatory Visit
Admission: RE | Admit: 2023-05-07 | Discharge: 2023-05-07 | Disposition: A | Discharge status: Home / Self Care | Payer: Federal, State, Local not specified - PPO | Source: Ambulatory Visit | Attending: Nephrology | Admitting: Nephrology

## 2023-05-07 DIAGNOSIS — N184 Chronic kidney disease, stage 4 (severe): Secondary | ICD-10-CM

## 2023-05-23 ENCOUNTER — Other Ambulatory Visit: Payer: Self-pay | Admitting: Family Medicine

## 2023-05-23 DIAGNOSIS — I10 Essential (primary) hypertension: Secondary | ICD-10-CM

## 2023-06-12 ENCOUNTER — Other Ambulatory Visit: Payer: Self-pay | Admitting: Family Medicine

## 2023-06-12 MED ORDER — BASAGLAR KWIKPEN 100 UNIT/ML ~~LOC~~ SOPN: PEN_INJECTOR | SUBCUTANEOUS | 2 refills | Status: DC

## 2023-06-12 NOTE — Telephone Encounter (Signed)
 Copied from CRM [id number]. Topic: Clinical - Medication Refill >> Jun 12, 2023  1:22 PM Fredrich Romans wrote: Most Recent Primary Care Visit:  Provider: Kristian Covey  Department: [facility]  Visit Type: OFFICE VISIT  Date: 04/03/2023  Medication: Insulin Glargine (BASAGLAR KWIKPEN) 100 UNIT/ML  Has the patient contacted their pharmacy? Yes (Agent: If no, request that the patient contact the pharmacy for the refill. If patient does not wish to contact the pharmacy document the reason why and proceed with request.) (Agent: If yes, when and what did the pharmacy advise?)  Is this the correct pharmacy for this prescription? Yes If no, delete pharmacy and type the correct one.  This is the patient's preferred pharmacy:  [facility] [address] [place] - [address] [place] [address] Phone: [phone] Fax: [phone]   Has the prescription been filled recently? Yno  Is the patient out of the medication? No(1 pen left)  Has the patient been seen for an appointment in the last year OR does the patient have an upcoming appointment? yes  Can we respond through MyChart? No  Agent: Please be advised that Rx refills may take up to 3 business days. We ask that you follow-up with your pharmacy.   *Patient would like to have 90 day supply sent in for patient due to it being cheaper in cost

## 2023-06-17 ENCOUNTER — Other Ambulatory Visit: Payer: Self-pay | Admitting: Family Medicine

## 2023-06-17 DIAGNOSIS — I10 Essential (primary) hypertension: Secondary | ICD-10-CM

## 2023-07-01 ENCOUNTER — Ambulatory Visit: Payer: Federal, State, Local not specified - PPO | Admitting: Family Medicine

## 2023-07-01 VITALS — BP 128/80 | HR 68 | Temp 97.6°F | Wt 241.5 lb

## 2023-07-01 DIAGNOSIS — Z794 Long term (current) use of insulin: Secondary | ICD-10-CM

## 2023-07-01 DIAGNOSIS — I10 Essential (primary) hypertension: Secondary | ICD-10-CM | POA: Diagnosis not present

## 2023-07-01 DIAGNOSIS — N1832 Chronic kidney disease, stage 3b: Secondary | ICD-10-CM | POA: Diagnosis not present

## 2023-07-01 DIAGNOSIS — E1165 Type 2 diabetes mellitus with hyperglycemia: Secondary | ICD-10-CM

## 2023-07-01 LAB — POCT GLYCOSYLATED HEMOGLOBIN (HGB A1C): Hemoglobin A1C: 14 % — AB (ref 4.0–5.6)

## 2023-07-01 MED ORDER — HUMALOG KWIKPEN 200 UNIT/ML ~~LOC~~ SOPN: PEN_INJECTOR | SUBCUTANEOUS | 2 refills | Status: AC

## 2023-07-01 NOTE — Progress Notes (Signed)
 Established Patient Office Visit  Subjective   Patient ID: Carl Lam, male    DOB: 20-Nov-1945  Age: 78 y.o. MRN: [id number]  Chief Complaint  Patient presents with   Medical Management of Chronic Issues    HPI   Mr Coppedge is seen today for medical follow-up.  He has history of CVA, hypertension, history of diverticulosis bleed, type 2 diabetes, chronic kidney disease, hyperlipidemia.  GFR last labs that was just below 30 we stopped his metformin.  He has not been checking his blood sugars regularly.  He states he quit exercising over the winter and is recently been drinking sodas several times per week.  His last A1c was up to 7.5% from previous of 6.4.  He takes several medications for hypertension including lisinopril 20 mg daily, amlodipine 10 mg daily, and chlorthalidone 25 mg daily.  Denies any recent headaches or dizziness.  He does eat out a fair bit including recent [place] food.  Eats a fair amount of starches in terms of rice and potatoes  He is compliant with atorvastatin 20 mg daily and had recent LDL cholesterol 79.  Denies any recent chest pains.  No focal weakness.  Past Medical History:  Diagnosis Date   Acute lower GI bleeding 10/11/2011, 12-16-2019   Anemia    BPH (benign prostatic hyperplasia)    Cataract    Colon polyp    CVA (cerebral vascular accident) (HCC) 12/14/2019   light stroke per pt gait is off a little per pt   DM type 2 (diabetes mellitus, type 2) (HCC) 10/27/2008   Dyspnea    sob with  heavy exertion   History of blood transfusion 12/16/2019   HYPERLIPIDEMIA 04/27/2009   Qualifier: Diagnosis of  By: Caryl Never MD, Annastyn Silvey     HYPERTENSION 10/27/2008   Qualifier: Diagnosis of  By: Gabriel Rung LPN, Nancy     Intermittent self-catheterization of bladder    daily   Wears dentures    Wears glasses    Past Surgical History:  Procedure Laterality Date   BIOPSY  05/07/2022   Procedure: BIOPSY;  Surgeon: Lemar Lofty., MD;  Location:  [facility] END[facility]COPY;  Service: Gastroenterology;;   CATARACT EXTRACTION Bilateral    COLONOSCOPY  10/12/2011   Procedure: COLONOSCOPY;  Surgeon: Hart Carwin, MD;  Location: [facility] ENDOSCOPY;  Service: Endoscopy;  Laterality: N/A;   COLONOSCOPY WITH PROPOFOL N/A 12/16/2019   Procedure: COLONOSCOPY WITH PROPOFOL;  Surgeon: Meryl Dare, MD;  Location: [facility] ENDOSCOPY;  Service: Endoscopy;  Laterality: N/A;   ESOPHAGOGASTRODUODENOSCOPY  10/12/2011   Procedure: ESOPHAGOGASTRODUODENOSCOPY (EGD);  Surgeon: Hart Carwin, MD;  Location: [facility] ENDOSCOPY;  Service: Endoscopy;  Laterality: N/A;   ESOPHAGOGASTRODUODENOSCOPY (EGD) WITH PROPOFOL N/A 12/16/2019   Procedure: ESOPHAGOGASTRODUODENOSCOPY (EGD) WITH PROPOFOL;  Surgeon: Meryl Dare, MD;  Location: [facility] ENDOSCOPY;  Service: Endoscopy;  Laterality: N/A;   ESOPHAGOGASTRODUODENOSCOPY (EGD) WITH PROPOFOL N/A 05/12/2020   Procedure: ESOPHAGOGASTRODUODENOSCOPY (EGD) WITH PROPOFOL;  Surgeon: Rachael Fee, MD;  Location: [facility] ENDOSCOPY;  Service: Endoscopy;  Laterality: N/A;   ESOPHAGOGASTRODUODENOSCOPY (EGD) WITH PROPOFOL N/A 10/05/2021   Procedure: ESOPHAGOGASTRODUODENOSCOPY (EGD) WITH PROPOFOL;  Surgeon: Rachael Fee, MD;  Location: [facility] ENDOSCOPY;  Service: Gastroenterology;  Laterality: N/A;   ESOPHAGOGASTRODUODENOSCOPY (EGD) WITH PROPOFOL N/A 05/07/2022   Procedure: ESOPHAGOGASTRODUODENOSCOPY (EGD) WITH PROPOFOL;  Surgeon: Meridee Score Netty Starring., MD;  Location: [facility] ENDOSCOPY;  Service: Gastroenterology;  Laterality: N/A;   EUS N/A 05/12/2020   Procedure: UPPER ENDOSCOPIC ULTRASOUND (EUS) RADIAL;  Surgeon: Rob Bunting  P, MD;  Location: [facility] ENDOSCOPY;  Service: Endoscopy;  Laterality: N/A;   EUS N/A 10/05/2021   Procedure: UPPER ENDOSCOPIC ULTRASOUND (EUS) RADIAL;  Surgeon: Rachael Fee, MD;  Location: [facility] ENDOSCOPY;  Service: Gastroenterology;  Laterality: N/A;   EUS N/A 10/05/2021   Procedure: UPPER ENDOSCOPIC ULTRASOUND (EUS) LINEAR;  Surgeon:  Rachael Fee, MD;  Location: [facility] ENDOSCOPY;  Service: Gastroenterology;  Laterality: N/A;   EUS N/A 05/07/2022   Procedure: UPPER ENDOSCOPIC ULTRASOUND (EUS) RADIAL;  Surgeon: Lemar Lofty., MD;  Location: [facility] ENDOSCOPY;  Service: Gastroenterology;  Laterality: N/A;   FINE NEEDLE ASPIRATION N/A 10/05/2021   Procedure: FINE NEEDLE ASPIRATION (FNA) LINEAR;  Surgeon: Rachael Fee, MD;  Location: [facility] ENDOSCOPY;  Service: Gastroenterology;  Laterality: N/A;   FINE NEEDLE ASPIRATION N/A 05/07/2022   Procedure: FINE NEEDLE ASPIRATION (FNA) LINEAR;  Surgeon: Lemar Lofty., MD;  Location: [facility] ENDOSCOPY;  Service: Gastroenterology;  Laterality: N/A;   HEMOSTASIS CLIP PLACEMENT  12/16/2019   Procedure: HEMOSTASIS CLIP PLACEMENT;  Surgeon: Meryl Dare, MD;  Location: [facility] ENDOSCOPY;  Service: Endoscopy;;   HERNIA REPAIR  2007   "navel"   JOINT REPLACEMENT     KNEE CARTILAGE SURGERY  1960's and 1970's   left; "2 total"   POLYPECTOMY  12/16/2019   Procedure: POLYPECTOMY;  Surgeon: Meryl Dare, MD;  Location: [facility] ENDOSCOPY;  Service: Endoscopy;;   TOTAL HIP ARTHROPLASTY Right 2007   TRANSURETHRAL RESECTION OF PROSTATE N/A 06/27/2020   Procedure: TRANSURETHRAL RESECTION OF THE PROSTATE (TURP);  Surgeon: Crista Elliot, MD;  Location: [facility];  Service: Urology;  Laterality: N/A;  REQUESTING 29 MINS FOR CASE    reports that he quit smoking about 44 years ago. His smoking use included cigarettes. He started smoking about 59 years ago. He has a 4.5 pack-year smoking history. He has never used smokeless tobacco. He reports that he does not currently use alcohol. He reports current drug use. Drug: Marijuana. family history includes Alcohol abuse in an other family member; Arthritis in an other family member; Diabetes in an other family member. No Known Allergies  Review of Systems  Constitutional:  Negative for malaise/fatigue.  Eyes:  Negative for blurred  vision.  Respiratory:  Negative for shortness of breath.   Cardiovascular:  Negative for chest pain.  Neurological:  Negative for dizziness, weakness and headaches.      Objective:     BP 128/80 (BP Location: Left Arm, Cuff Size: Normal)   Pulse 68   Temp 97.6 F (36.4 C) (Oral)   Wt 241 lb 8 oz (109.5 kg)   SpO2 99%   BMI 31.01 kg/m  BP Readings from Last 3 Encounters:  07/01/23 128/80  04/03/23 (!) 144/88  01/10/23 116/74   Wt Readings from Last 3 Encounters:  07/01/23 241 lb 8 oz (109.5 kg)  04/03/23 241 lb 4.8 oz (109.5 kg)  01/10/23 242 lb (109.8 kg)      Physical Exam Vitals reviewed.  Constitutional:      Appearance: He is well-developed.  Eyes:     Pupils: Pupils are equal, round, and reactive to light.  Neck:     Thyroid: No thyromegaly.  Cardiovascular:     Rate and Rhythm: Normal rate and regular rhythm.  Pulmonary:     Effort: Pulmonary effort is normal. No respiratory distress.     Breath sounds: Normal breath sounds. No wheezing or rales.  Musculoskeletal:     Cervical back: Neck supple.  Right lower leg: No edema.     Left lower leg: No edema.  Neurological:     Mental Status: He is alert and oriented to person, place, and time.      Results for orders placed or performed in visit on 07/01/23  POC HgB A1c  Result Value Ref Range   Hemoglobin A1C 14.0 (A) 4.0 - 5.6 %   HbA1c POC (<> result, manual entry)     HbA1c, POC (prediabetic range)     HbA1c, POC (controlled diabetic range)      Last CBC Lab Results  Component Value Date   WBC 8.5 01/29/2023   HGB 12.1 (L) 01/29/2023   HCT 38.0 (L) 01/29/2023   MCV 91.2 01/29/2023   MCH 28.2 06/23/2020   RDW 15.2 01/29/2023   PLT 282.0 01/29/2023   Last metabolic panel Lab Results  Component Value Date   GLUCOSE 123 (H) 04/03/2023   NA 140 04/03/2023   K 4.2 04/03/2023   CL 106 04/03/2023   CO2 27 04/03/2023   BUN 31 (H) 04/03/2023   CREATININE 2.10 (H) 04/03/2023   GFR 29.95  (L) 04/03/2023   CALCIUM 9.3 04/03/2023   PROT 7.5 01/04/2023   ALBUMIN 4.2 01/04/2023   BILITOT 0.5 01/04/2023   ALKPHOS 48 01/04/2023   AST 14 01/04/2023   ALT 11 01/04/2023   ANIONGAP 7 06/23/2020   Last lipids Lab Results  Component Value Date   CHOL 141 01/04/2023   HDL 46.20 01/04/2023   LDLCALC 79 01/04/2023   LDLDIRECT 168.2 07/11/2012   TRIG 76.0 01/04/2023   CHOLHDL 3 01/04/2023   Last hemoglobin A1c Lab Results  Component Value Date   HGBA1C 14.0 (A) 07/01/2023      The ASCVD Risk score (Arnett DK, et al., 2019) failed to calculate for the following reasons:   Risk score cannot be calculated because patient has a medical history suggesting prior/existing ASCVD    Assessment & Plan:   #1 type 2 diabetes very poorly controlled A1c today 14%.  This is up dramatically from 7.5% last visit.  We had to stop metformin secondary to GFR less than 30.  Recent poor compliance with diet and exercise.  We recommend the following:  -Stop sodas and reduce high glycemic foods.  This was reviewed in some detail today -Increase exercise with walking -Increase Basaglar to 40 units daily -Start Humalog 5 units subcutaneous 3 times daily with meals -We did recommend he consider continuous glucose monitor but he declines.  We recommend at least try to check blood sugars more regularly over the next few weeks and record -Set up 51-month follow-up  #2 hypertension.  He had quite elevated reading here initially today but came down substantially to 128/80 after rest.  Try to keep daily sodium less than 2000 mg.  Continue to monitor.  Continue current regimen as above  #3 chronic kidney disease.  Recent GFR 29.  Now off metformin.  Avoid nonsteroidals.  Recheck basic metabolic panel at follow-up   Return in about 3 months (around 10/01/2023).    Evelena Peat, MD

## 2023-07-01 NOTE — Patient Instructions (Signed)
 Increase Basaglar to 40 units once daily  Start the Humalog 5 units three times daily with meals  Start back exercising!  Stop the sodas!

## 2023-07-15 ENCOUNTER — Other Ambulatory Visit: Payer: Self-pay | Admitting: Family Medicine

## 2023-08-20 ENCOUNTER — Other Ambulatory Visit: Payer: Self-pay | Admitting: Family Medicine

## 2023-08-20 DIAGNOSIS — I10 Essential (primary) hypertension: Secondary | ICD-10-CM

## 2023-08-28 ENCOUNTER — Telehealth: Payer: Self-pay | Admitting: *Deleted

## 2023-08-28 NOTE — Telephone Encounter (Signed)
 Copied from CRM [id number]. Topic: Clinical - Prescription Issue >> Aug 28, 2023  3:56 PM Carl Lam wrote: Reason for CRM: Patient would like to have new prescription reflecting his dosage change of 45 units of Insulin  Glargine (BASAGLAR  KWIKPEN [place]) sent to pharmacy,90 days refill He stated this change ws made at this appointment with provider,however pharmacy needs new prescription   [facility] [address] [place], [place] - [address]  Phone: [phone] Fax: [phone]

## 2023-08-29 MED ORDER — BASAGLAR KWIKPEN 100 UNIT/ML ~~LOC~~ SOPN: 40.0000 [IU] | PEN_INJECTOR | Freq: Every day | SUBCUTANEOUS | 1 refills | Status: DC

## 2023-09-10 ENCOUNTER — Other Ambulatory Visit: Payer: Self-pay | Admitting: Family Medicine

## 2023-09-10 DIAGNOSIS — I10 Essential (primary) hypertension: Secondary | ICD-10-CM

## 2023-10-01 ENCOUNTER — Ambulatory Visit: Admitting: Family Medicine

## 2023-10-01 ENCOUNTER — Ambulatory Visit: Payer: Self-pay | Admitting: Family Medicine

## 2023-10-01 ENCOUNTER — Encounter: Payer: Self-pay | Admitting: Family Medicine

## 2023-10-01 VITALS — BP 160/90 | HR 70 | Temp 97.8°F | Wt 246.1 lb

## 2023-10-01 DIAGNOSIS — E785 Hyperlipidemia, unspecified: Secondary | ICD-10-CM

## 2023-10-01 DIAGNOSIS — D649 Anemia, unspecified: Secondary | ICD-10-CM

## 2023-10-01 DIAGNOSIS — Z794 Long term (current) use of insulin: Secondary | ICD-10-CM | POA: Diagnosis not present

## 2023-10-01 DIAGNOSIS — E1165 Type 2 diabetes mellitus with hyperglycemia: Secondary | ICD-10-CM | POA: Diagnosis not present

## 2023-10-01 DIAGNOSIS — N1832 Chronic kidney disease, stage 3b: Secondary | ICD-10-CM | POA: Diagnosis not present

## 2023-10-01 DIAGNOSIS — I10 Essential (primary) hypertension: Secondary | ICD-10-CM | POA: Diagnosis not present

## 2023-10-01 LAB — COMPREHENSIVE METABOLIC PANEL WITH GFR
ALT: 9 U/L (ref 0–53)
AST: 11 U/L (ref 0–37)
Albumin: 4.2 g/dL (ref 3.5–5.2)
Alkaline Phosphatase: 53 U/L (ref 39–117)
BUN: 36 mg/dL — ABNORMAL HIGH (ref 6–23)
CO2: 26 meq/L (ref 19–32)
Calcium: 8.9 mg/dL (ref 8.4–10.5)
Chloride: 108 meq/L (ref 96–112)
Creatinine, Ser: 2.42 mg/dL — ABNORMAL HIGH (ref 0.40–1.50)
GFR: 25.17 mL/min — ABNORMAL LOW (ref >60.00)
Glucose, Bld: 109 mg/dL — ABNORMAL HIGH (ref 70–99)
Potassium: 5.1 meq/L (ref 3.5–5.1)
Sodium: 139 meq/L (ref 135–145)
Total Bilirubin: 0.4 mg/dL (ref 0.2–1.2)
Total Protein: 7.4 g/dL (ref 6.0–8.3)

## 2023-10-01 LAB — CBC WITH DIFFERENTIAL/PLATELET
Basophils Absolute: 0 10*3/uL (ref 0.0–0.1)
Basophils Relative: 0.8 % (ref 0.0–3.0)
Eosinophils Absolute: 0.1 10*3/uL (ref 0.0–0.7)
Eosinophils Relative: 2.3 % (ref 0.0–5.0)
HCT: 36.5 % — ABNORMAL LOW (ref 39.0–52.0)
Hemoglobin: 12.1 g/dL — ABNORMAL LOW (ref 13.0–17.0)
Lymphocytes Relative: 26.1 % (ref 12.0–46.0)
Lymphs Abs: 1.3 10*3/uL (ref 0.7–4.0)
MCHC: 33.1 g/dL (ref 30.0–36.0)
MCV: 89.7 fl (ref 78.0–100.0)
Monocytes Absolute: 0.4 10*3/uL (ref 0.1–1.0)
Monocytes Relative: 8.4 % (ref 3.0–12.0)
Neutro Abs: 3.2 10*3/uL (ref 1.4–7.7)
Neutrophils Relative %: 62.4 % (ref 43.0–77.0)
Platelets: 257 10*3/uL (ref 150.0–400.0)
RBC: 4.07 Mil/uL — ABNORMAL LOW (ref 4.22–5.81)
RDW: 14.6 % (ref 11.5–15.5)
WBC: 5.1 10*3/uL (ref 4.0–10.5)

## 2023-10-01 LAB — LIPID PANEL
Cholesterol: 149 mg/dL (ref 0–200)
HDL: 42.5 mg/dL (ref >39.00)
LDL Cholesterol: 94 mg/dL (ref 0–99)
NonHDL: 106.65
Total CHOL/HDL Ratio: 4
Triglycerides: 65 mg/dL (ref 0.0–149.0)
VLDL: 13 mg/dL (ref 0.0–40.0)

## 2023-10-01 LAB — MICROALBUMIN / CREATININE URINE RATIO
Creatinine,U: 0 mg/dL
Microalb Creat Ratio: UNDETERMINED mg/g (ref 0.0–30.0)
Microalb, Ur: <0.7 mg/dL

## 2023-10-01 LAB — POCT GLYCOSYLATED HEMOGLOBIN (HGB A1C): Hemoglobin A1C: 7.5 % — AB (ref 4.0–5.6)

## 2023-10-01 MED ORDER — BASAGLAR KWIKPEN 100 UNIT/ML ~~LOC~~ SOPN: 45.0000 [IU] | PEN_INJECTOR | Freq: Every day | SUBCUTANEOUS | 2 refills | Status: DC

## 2023-10-01 NOTE — Progress Notes (Signed)
 Established Patient Office Visit  Subjective   Patient ID: Carl Lam, male    DOB: 16-Dec-1945  Age: 78 y.o. MRN: [id number]  Chief Complaint  Patient presents with   Medical Management of Chronic Issues    HPI   Mr. Wilda Handsome is seen today for medical follow-up.  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  Plan is for repeat MRCP in August.  He has stable appetite and weight also stable.  Generally doing well.  His A1c was extremely high last visit in March at 14.  His kidney function had worsened.  We had him stop metformin .  He is made some major dietary changes since last visit we also added Humalog  5 units 3 times daily with meals.  His Basaglar  is now at 45 units daily.  Not monitoring blood sugars regularly.  No recent hypoglycemia.  He has chronic kidney disease and last GFR was around 30.  We did stop metformin .  He has hypertension which is generally been well-controlled with amlodipine , lisinopril , and chlorthalidone .  He has home blood pressure monitor but not checking regularly.  Past Medical History:  Diagnosis Date   Acute lower GI bleeding 10/11/2011, 12-16-2019   Anemia    BPH (benign prostatic hyperplasia)    Cataract    Colon polyp    CVA (cerebral vascular accident) (HCC) 12/14/2019   light stroke per pt gait is off a little per pt   DM type 2 (diabetes mellitus, type 2) (HCC) 10/27/2008   Dyspnea    sob with  heavy exertion   History of blood transfusion 12/16/2019   HYPERLIPIDEMIA 04/27/2009   Qualifier: Diagnosis of  By: Darren Em MD, Abrial Arrighi     HYPERTENSION 10/27/2008   Qualifier: Diagnosis of  By: Ivy Marseilles LPN, Nancy     Intermittent self-catheterization of bladder    daily   Wears dentures    Wears glasses    Past Surgical History:  Procedure Laterality Date   BIOPSY   05/07/2022   Procedure: BIOPSY;  Surgeon: Normie Becton., MD;  Location: [facility];  Service: Gastroenterology;;   CATARACT EXTRACTION Bilateral    COLONOSCOPY  10/12/2011   Procedure: COLONOSCOPY;  Surgeon: Pietro Bridegroom, MD;  Location: [facility] ENDOSCOPY;  Service: Endoscopy;  Laterality: N/A;   COLONOSCOPY WITH PROPOFOL  N/A 12/16/2019   Procedure: COLONOSCOPY WITH PROPOFOL ;  Surgeon: Asencion Blacksmith, MD;  Location: [facility] ENDOSCOPY;  Service: Endoscopy;  Laterality: N/A;   ESOPHAGOGASTRODUODENOSCOPY  10/12/2011   Procedure: ESOPHAGOGASTRODUODENOSCOPY (EGD);  Surgeon: Pietro Bridegroom, MD;  Location: [facility] ENDOSCOPY;  Service: Endoscopy;  Laterality: N/A;   ESOPHAGOGASTRODUODENOSCOPY (EGD) WITH PROPOFOL  N/A 12/16/2019   Procedure: ESOPHAGOGASTRODUODENOSCOPY (EGD) WITH PROPOFOL ;  Surgeon: Asencion Blacksmith, MD;  Location: [facility] ENDOSCOPY;  Service: Endoscopy;  Laterality: N/A;   ESOPHAGOGASTRODUODENOSCOPY (EGD) WITH PROPOFOL  N/A 05/12/2020   Procedure: ESOPHAGOGASTRODUODENOSCOPY (EGD) WITH PROPOFOL ;  Surgeon: Janel Medford, MD;  Location: [facility] ENDOSCOPY;  Service: Endoscopy;  Laterality: N/A;   ESOPHAGOGASTRODUODENOSCOPY (EGD) WITH PROPOFOL  N/A 10/05/2021   Procedure: ESOPHAGOGASTRODUODENOSCOPY (EGD) WITH PROPOFOL ;  Surgeon: Janel Medford, MD;  Location: [facility] ENDOSCOPY;  Service: Gastroenterology;  Laterality: N/A;   ESOPHAGOGASTRODUODENOSCOPY (EGD) WITH PROPOFOL  N/A 05/07/2022   Procedure: ESOPHAGOGASTRODUODENOSCOPY (EGD) WITH PROPOFOL ;  Surgeon: Brice Campi Albino Alu., MD;  Location: [facility] ENDOSCOPY;  Service: Gastroenterology;  Laterality: N/A;   EUS N/A 05/12/2020   Procedure:  UPPER ENDOSCOPIC ULTRASOUND (EUS) RADIAL;  Surgeon: Janel Medford, MD;  Location: [facility] ENDOSCOPY;  Service: Endoscopy;  Laterality: N/A;   EUS N/A 10/05/2021   Procedure: UPPER ENDOSCOPIC ULTRASOUND (EUS) RADIAL;  Surgeon: Janel Medford, MD;  Location: [facility] ENDOSCOPY;  Service: Gastroenterology;  Laterality: N/A;   EUS N/A  10/05/2021   Procedure: UPPER ENDOSCOPIC ULTRASOUND (EUS) LINEAR;  Surgeon: Janel Medford, MD;  Location: [facility] ENDOSCOPY;  Service: Gastroenterology;  Laterality: N/A;   EUS N/A 05/07/2022   Procedure: UPPER ENDOSCOPIC ULTRASOUND (EUS) RADIAL;  Surgeon: Normie Becton., MD;  Location: [facility] ENDOSCOPY;  Service: Gastroenterology;  Laterality: N/A;   FINE NEEDLE ASPIRATION N/A 10/05/2021   Procedure: FINE NEEDLE ASPIRATION (FNA) LINEAR;  Surgeon: Janel Medford, MD;  Location: [facility] ENDOSCOPY;  Service: Gastroenterology;  Laterality: N/A;   FINE NEEDLE ASPIRATION N/A 05/07/2022   Procedure: FINE NEEDLE ASPIRATION (FNA) LINEAR;  Surgeon: Normie Becton., MD;  Location: [facility] ENDOSCOPY;  Service: Gastroenterology;  Laterality: N/A;   HEMOSTASIS CLIP PLACEMENT  12/16/2019   Procedure: HEMOSTASIS CLIP PLACEMENT;  Surgeon: Asencion Blacksmith, MD;  Location: [facility] ENDOSCOPY;  Service: Endoscopy;;   HERNIA REPAIR  2007   navel   JOINT REPLACEMENT     KNEE CARTILAGE SURGERY  1960's and 1970's   left; 2 total   POLYPECTOMY  12/16/2019   Procedure: POLYPECTOMY;  Surgeon: Asencion Blacksmith, MD;  Location: [facility] ENDOSCOPY;  Service: Endoscopy;;   TOTAL HIP ARTHROPLASTY Right 2007   TRANSURETHRAL RESECTION OF PROSTATE N/A 06/27/2020   Procedure: TRANSURETHRAL RESECTION OF THE PROSTATE (TURP);  Surgeon: Samson Croak, MD;  Location: [facility];  Service: Urology;  Laterality: N/A;  REQUESTING 36 MINS FOR CASE    reports that he quit smoking about 45 years ago. His smoking use included cigarettes. He started smoking about 60 years ago. He has a 4.5 pack-year smoking history. He has never used smokeless tobacco. He reports that he does not currently use alcohol. He reports current drug use. Drug: Marijuana. family history includes Alcohol abuse in an other family member; Arthritis in an other family member; Diabetes in an other family member. No Known Allergies  Review of Systems   Constitutional:  Negative for chills, fever and malaise/fatigue.  Eyes:  Negative for blurred vision.  Respiratory:  Negative for shortness of breath.   Cardiovascular:  Negative for chest pain.  Gastrointestinal:  Negative for abdominal pain.  Neurological:  Negative for dizziness, weakness and headaches.      Objective:     BP (!) 160/90   Pulse 70   Temp 97.8 F (36.6 C) (Oral)   Wt 246 lb 1.6 oz (111.6 kg)   SpO2 98%   BMI 31.60 kg/m  BP Readings from Last 3 Encounters:  10/01/23 (!) 160/90  07/01/23 128/80  04/03/23 (!) 144/88   Wt Readings from Last 3 Encounters:  10/01/23 246 lb 1.6 oz (111.6 kg)  07/01/23 241 lb 8 oz (109.5 kg)  04/03/23 241 lb 4.8 oz (109.5 kg)      Physical Exam Vitals reviewed.  Constitutional:      General: He is not in acute distress.    Appearance: He is well-developed. He is not ill-appearing.  HENT:     Right Ear: External ear normal.     Left Ear: External ear normal.   Eyes:     Pupils: Pupils are equal, round, and reactive to light.   Neck:     Thyroid : No  thyromegaly.   Cardiovascular:     Rate and Rhythm: Normal rate and regular rhythm.  Pulmonary:     Effort: Pulmonary effort is normal. No respiratory distress.     Breath sounds: Normal breath sounds. No wheezing or rales.   Musculoskeletal:     Cervical back: Neck supple.   Skin:    Comments: Revealed no lesions.  He has impairment with monofilament testing left foot 1st and 2nd toe volar surface.   Neurological:     Mental Status: He is alert and oriented to person, place, and time.      Results for orders placed or performed in visit on 10/01/23  POC HgB A1c  Result Value Ref Range   Hemoglobin A1C 7.5 (A) 4.0 - 5.6 %   HbA1c POC (<> result, manual entry)     HbA1c, POC (prediabetic range)     HbA1c, POC (controlled diabetic range)      Last CBC Lab Results  Component Value Date   WBC 8.5 01/29/2023   HGB 12.1 (L) 01/29/2023   HCT 38.0 (L)  01/29/2023   MCV 91.2 01/29/2023   MCH 28.2 06/23/2020   RDW 15.2 01/29/2023   PLT 282.0 01/29/2023   Last metabolic panel Lab Results  Component Value Date   GLUCOSE 123 (H) 04/03/2023   NA 140 04/03/2023   K 4.2 04/03/2023   CL 106 04/03/2023   CO2 27 04/03/2023   BUN 31 (H) 04/03/2023   CREATININE 2.10 (H) 04/03/2023   GFR 29.95 (L) 04/03/2023   CALCIUM  9.3 04/03/2023   PROT 7.5 01/04/2023   ALBUMIN 4.2 01/04/2023   BILITOT 0.5 01/04/2023   ALKPHOS 48 01/04/2023   AST 14 01/04/2023   ALT 11 01/04/2023   ANIONGAP 7 06/23/2020   Last lipids Lab Results  Component Value Date   CHOL 141 01/04/2023   HDL 46.20 01/04/2023   LDLCALC 79 01/04/2023   LDLDIRECT 168.2 07/11/2012   TRIG 76.0 01/04/2023   CHOLHDL 3 01/04/2023   Last hemoglobin A1c Lab Results  Component Value Date   HGBA1C 7.5 (A) 10/01/2023      The ASCVD Risk score (Arnett DK, et al., 2019) failed to calculate for the following reasons:   Risk score cannot be calculated because patient has a medical history suggesting prior/existing ASCVD    Assessment & Plan:   Problem List Items Addressed This Visit       Unprioritized   CKD (chronic kidney disease) stage 3, GFR 30-59 ml/min (HCC)   Relevant Orders   CBC with Differential/Platelet   CMP   Type 2 diabetes mellitus with hyperglycemia (HCC) - Primary   Relevant Medications   Insulin  Glargine (BASAGLAR  KWIKPEN) 100 UNIT/ML   Other Relevant Orders   POC HgB A1c (Completed)   Microalbumin / creatinine urine ratio   Essential hypertension   Hyperlipidemia   Relevant Orders   Lipid panel   Other Visit Diagnoses       Normocytic anemia       Relevant Orders   CBC with Differential/Platelet     Multiple chronic problems as above.  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  We have instructed him to notify us  if he is not heard from GI about August.  -A1c today greatly improved  7.5%.  He would like to give this 3 more months of lifestyle modification and repeat before adding additional medication.  Refill Basaglar  - Make sure you are  getting yearly diabetic eye exam - Check urine microalbumin screen  Blood pressure was up today.  Patient is on 3 drug regimen currently.  Discussed importance of good blood pressure control specially in the setting of prior CVA and chronic kidney disease.  Handout on DASH diet given.  We discussed possible additional medication today such as hydralazine  but patient declines.  He would like to give this 3 months of monitoring.  He does have home blood pressure monitor.  Be in touch if consistently greater than 140 systolic.  Goal blood pressure less than 130/80  Return in about 3 months (around 01/01/2024).    Glean Lamy, MD

## 2023-10-01 NOTE — Patient Instructions (Signed)
 Keep daily sodium < 2,500 mg .

## 2023-10-02 MED ORDER — HYDRALAZINE HCL 25 MG PO TABS: 25.0000 mg | ORAL_TABLET | Freq: Two times a day (BID) | ORAL | 0 refills | Status: DC

## 2023-10-07 ENCOUNTER — Other Ambulatory Visit: Payer: Self-pay | Admitting: Family Medicine

## 2023-10-28 ENCOUNTER — Other Ambulatory Visit: Payer: Self-pay | Admitting: Family Medicine

## 2023-11-16 ENCOUNTER — Other Ambulatory Visit: Payer: Self-pay | Admitting: Family Medicine

## 2023-11-24 ENCOUNTER — Other Ambulatory Visit: Payer: Self-pay | Admitting: Family Medicine

## 2023-11-24 DIAGNOSIS — I10 Essential (primary) hypertension: Secondary | ICD-10-CM

## 2023-12-10 ENCOUNTER — Other Ambulatory Visit: Payer: Self-pay | Admitting: Family Medicine

## 2023-12-10 DIAGNOSIS — I10 Essential (primary) hypertension: Secondary | ICD-10-CM

## 2023-12-11 ENCOUNTER — Other Ambulatory Visit: Payer: Self-pay

## 2023-12-11 ENCOUNTER — Telehealth: Payer: Self-pay

## 2023-12-11 DIAGNOSIS — K862 Cyst of pancreas: Secondary | ICD-10-CM

## 2023-12-11 NOTE — Telephone Encounter (Signed)
-----   Message from Nurse Mendon B sent at 12/11/2023  8:46 AM EDT ----- Regarding: FW: 1 year MRCP Dr. Leigh patient ----- Message ----- From: Mercer Cristino SAILOR, RN Sent: 12/11/2023  12:00 AM EDT To: Cristino SAILOR Monte, RN Subject: 1 year MRCP                                    MRCP due - pancreatic cyst - need to order

## 2023-12-11 NOTE — Telephone Encounter (Signed)
 Order in epic, check to see if scheduled.

## 2023-12-17 ENCOUNTER — Telehealth: Payer: Self-pay | Admitting: Gastroenterology

## 2023-12-17 MED ORDER — DIAZEPAM 5 MG PO TABS: ORAL_TABLET | ORAL | 0 refills | Status: AC

## 2023-12-17 NOTE — Telephone Encounter (Signed)
Prescription sent to pharmacy. Pt aware. 

## 2023-12-17 NOTE — Telephone Encounter (Signed)
 See note below, pt scheduled for MRI on 12/19/23.

## 2023-12-17 NOTE — Telephone Encounter (Signed)
 Okay to give 5mg  Valium  x 1 po about 30 min patio to mri. Thanks

## 2023-12-17 NOTE — Telephone Encounter (Signed)
 PT is calling to have a valium  prescription sent to his pharmacy prior to his procedure.Please advise.

## 2023-12-19 ENCOUNTER — Ambulatory Visit (HOSPITAL_COMMUNITY)
Admission: RE | Admit: 2023-12-19 | Discharge: 2023-12-19 | Disposition: A | Discharge status: Home / Self Care | Source: Ambulatory Visit | Attending: Gastroenterology | Admitting: Gastroenterology

## 2023-12-19 ENCOUNTER — Other Ambulatory Visit: Payer: Self-pay | Admitting: Gastroenterology

## 2023-12-19 DIAGNOSIS — K862 Cyst of pancreas: Secondary | ICD-10-CM | POA: Diagnosis present

## 2023-12-19 MED ORDER — GADOBUTROL 1 MMOL/ML IV SOLN
10.0000 mL | Freq: Once | INTRAVENOUS | Status: AC | PRN
  Administered 2023-12-19: 10 mL via INTRAVENOUS

## 2023-12-24 ENCOUNTER — Ambulatory Visit: Payer: Self-pay | Admitting: Gastroenterology

## 2024-01-01 ENCOUNTER — Ambulatory Visit: Admitting: Family Medicine

## 2024-01-01 ENCOUNTER — Encounter: Payer: Self-pay | Admitting: Family Medicine

## 2024-01-01 VITALS — BP 134/80 | HR 64 | Temp 98.0°F | Wt 248.9 lb

## 2024-01-01 DIAGNOSIS — E785 Hyperlipidemia, unspecified: Secondary | ICD-10-CM | POA: Diagnosis not present

## 2024-01-01 DIAGNOSIS — Z794 Long term (current) use of insulin: Secondary | ICD-10-CM | POA: Diagnosis not present

## 2024-01-01 DIAGNOSIS — E1165 Type 2 diabetes mellitus with hyperglycemia: Secondary | ICD-10-CM

## 2024-01-01 DIAGNOSIS — I10 Essential (primary) hypertension: Secondary | ICD-10-CM | POA: Diagnosis not present

## 2024-01-01 LAB — POCT GLYCOSYLATED HEMOGLOBIN (HGB A1C): Hemoglobin A1C: 8.1 % — AB (ref 4.0–5.6)

## 2024-01-01 MED ORDER — ATORVASTATIN CALCIUM 40 MG PO TABS: 40.0000 mg | ORAL_TABLET | Freq: Every day | ORAL | 3 refills | Status: AC

## 2024-01-01 NOTE — Progress Notes (Signed)
 Established Patient Office Visit  Subjective   Patient ID: Carl Lam, male    DOB: [birth year]  Age: 78 y.o. MRN: [id number]  Chief Complaint  Patient presents with   Medical Management of Chronic Issues    HPI   Carl Lam is seen for medical follow-up.  He has history of CVA, hypertension, type 2 diabetes with hyperglycemia, chronic kidney disease stage III, hyperlipidemia.  He has been compliant with Basaglar  insulin  but not taking Humalog  regularly.  He had some edema issues and wondered if this was related to the Humalog .  Not monitoring blood sugars regularly.  He has history of pancreatic cyst and had recent MRI scan which thankfully showed no significant change in complex cystic mass of the pancreatic tail.  This is followed by GI  Patient has past history of stroke.  Dyslipidemia history and currently on atorvastatin  20 mg daily.  Tolerating well with no myalgias.  Last LDL cholesterol 94.  Blood pressure poorly controlled last visit and we added hydralazine  25 mg twice daily and tolerating well.  He takes this in addition to chlorthalidone , lisinopril , and amlodipine .  He is currently participating in aerobics type class and trying to lose some weight  Past Medical History:  Diagnosis Date   Acute lower GI bleeding 10/11/2011, 12-16-2019   Anemia    BPH (benign prostatic hyperplasia)    Cataract    Colon polyp    CVA (cerebral vascular accident) (HCC) 12/14/2019   light stroke per pt gait is off a little per pt   DM type 2 (diabetes mellitus, type 2) (HCC) 10/27/2008   Dyspnea    sob with  heavy exertion   History of blood transfusion 12/16/2019   HYPERLIPIDEMIA 04/27/2009   Qualifier: Diagnosis of  By: Micheal MD, Dru Laurel     HYPERTENSION 10/27/2008   Qualifier: Diagnosis of  By: Lavinia LPN, Nancy     Intermittent self-catheterization of bladder    daily   Wears dentures    Wears glasses    Past Surgical History:  Procedure Laterality Date   BIOPSY   05/07/2022   Procedure: BIOPSY;  Surgeon: Wilhelmenia Aloha Raddle., MD;  Location: [facility];  Service: Gastroenterology;;   CATARACT EXTRACTION Bilateral    COLONOSCOPY  10/12/2011   Procedure: COLONOSCOPY;  Surgeon: Princella CHRISTELLA Nida, MD;  Location: [facility] ENDOSCOPY;  Service: Endoscopy;  Laterality: N/A;   COLONOSCOPY WITH PROPOFOL  N/A 12/16/2019   Procedure: COLONOSCOPY WITH PROPOFOL ;  Surgeon: Aneita Gwendlyn DASEN, MD;  Location: [facility] ENDOSCOPY;  Service: Endoscopy;  Laterality: N/A;   ESOPHAGOGASTRODUODENOSCOPY  10/12/2011   Procedure: ESOPHAGOGASTRODUODENOSCOPY (EGD);  Surgeon: Princella CHRISTELLA Nida, MD;  Location: [facility] ENDOSCOPY;  Service: Endoscopy;  Laterality: N/A;   ESOPHAGOGASTRODUODENOSCOPY (EGD) WITH PROPOFOL  N/A 12/16/2019   Procedure: ESOPHAGOGASTRODUODENOSCOPY (EGD) WITH PROPOFOL ;  Surgeon: Aneita Gwendlyn DASEN, MD;  Location: [facility] ENDOSCOPY;  Service: Endoscopy;  Laterality: N/A;   ESOPHAGOGASTRODUODENOSCOPY (EGD) WITH PROPOFOL  N/A 05/12/2020   Procedure: ESOPHAGOGASTRODUODENOSCOPY (EGD) WITH PROPOFOL ;  Surgeon: Teressa Toribio SQUIBB, MD;  Location: [facility]OSCOPY;  Service: Endoscopy;  Laterality: N/A;   ESOPHAGOGASTRODUODENOSCOPY (EGD) WITH PROPOFOL  N/A 10/05/2021   Procedure: ESOPHAGOGASTRODUODENOSCOPY (EGD) WITH PROPOFOL ;  Surgeon: Teressa Toribio SQUIBB, MD;  Location: [facility];  Service: Gastroenterology;  Laterality: N/A;   ESOPHAGOGASTRODUODENOSCOPY (EGD) WITH PROPOFOL  N/A 05/07/2022   Procedure: ESOPHAGOGASTRODUODENOSCOPY (EGD) WITH PROPOFOL ;  Surgeon: Wilhelmenia Aloha Raddle., MD;  Location: [facility];  Service: Gastroenterology;  Laterality: N/A;   EUS N/A 05/12/2020   Procedure: UPPER ENDOSCOPIC ULTRASOUND (EUS)  RADIAL;  Surgeon: Teressa Toribio SQUIBB, MD;  Location: [facility];  Service: Endoscopy;  Laterality: N/A;   EUS N/A 10/05/2021   Procedure: UPPER ENDOSCOPIC ULTRASOUND (EUS) RADIAL;  Surgeon: Teressa Toribio SQUIBB, MD;  Location: [facility];  Service: Gastroenterology;  Laterality: N/A;   EUS N/A  10/05/2021   Procedure: UPPER ENDOSCOPIC ULTRASOUND (EUS) LINEAR;  Surgeon: Teressa Toribio SQUIBB, MD;  Location: [facility];  Service: Gastroenterology;  Laterality: N/A;   EUS N/A 05/07/2022   Procedure: UPPER ENDOSCOPIC ULTRASOUND (EUS) RADIAL;  Surgeon: Wilhelmenia Aloha Raddle., MD;  Location: [facility];  Service: Gastroenterology;  Laterality: N/A;   FINE NEEDLE ASPIRATION N/A 10/05/2021   Procedure: FINE NEEDLE ASPIRATION (FNA) LINEAR;  Surgeon: Teressa Toribio SQUIBB, MD;  Location: [facility];  Service: Gastroenterology;  Laterality: N/A;   FINE NEEDLE ASPIRATION N/A 05/07/2022   Procedure: FINE NEEDLE ASPIRATION (FNA) LINEAR;  Surgeon: Wilhelmenia Aloha Raddle., MD;  Location: [facility];  Service: Gastroenterology;  Laterality: N/A;   HEMOSTASIS CLIP PLACEMENT  12/16/2019   Procedure: HEMOSTASIS CLIP PLACEMENT;  Surgeon: Aneita Gwendlyn DASEN, MD;  Location: [facility] ENDOSCOPY;  Service: Endoscopy;;   HERNIA REPAIR  2007   navel   JOINT REPLACEMENT     KNEE CARTILAGE SURGERY  1960's and 1970's   left; 2 total   POLYPECTOMY  12/16/2019   Procedure: POLYPECTOMY;  Surgeon: Aneita Gwendlyn DASEN, MD;  Location: [facility] ENDOSCOPY;  Service: Endoscopy;;   TOTAL HIP ARTHROPLASTY Right 2007   TRANSURETHRAL RESECTION OF PROSTATE N/A 06/27/2020   Procedure: TRANSURETHRAL RESECTION OF THE PROSTATE (TURP);  Surgeon: Carolee Sherwood JONETTA DOUGLAS, MD;  Location: [facility];  Service: Urology;  Laterality: N/A;  REQUESTING 44 MINS FOR CASE    reports that he quit smoking about 45 years ago. His smoking use included cigarettes. He started smoking about 60 years ago. He has a 4.5 pack-year smoking history. He has never used smokeless tobacco. He reports that he does not currently use alcohol. He reports current drug use. Drug: Marijuana. family history includes Alcohol abuse in an other family member; Arthritis in an other family member; Diabetes in an other family member. No Known Allergies  Review of Systems   Constitutional:  Negative for malaise/fatigue.  Eyes:  Negative for blurred vision.  Respiratory:  Negative for shortness of breath.   Cardiovascular:  Negative for chest pain.  Neurological:  Negative for dizziness, weakness and headaches.      Objective:     BP 134/80   Pulse 64   Temp 98 F (36.7 C) (Oral)   Wt 248 lb 14.4 oz (112.9 kg)   SpO2 96%   BMI 31.96 kg/m  BP Readings from Last 3 Encounters:  01/01/24 134/80  10/01/23 (!) 160/90  07/01/23 128/80   Wt Readings from Last 3 Encounters:  01/01/24 248 lb 14.4 oz (112.9 kg)  10/01/23 246 lb 1.6 oz (111.6 kg)  07/01/23 241 lb 8 oz (109.5 kg)      Physical Exam Vitals reviewed.  Constitutional:      Appearance: He is well-developed. He is not toxic-appearing or diaphoretic.  Eyes:     Pupils: Pupils are equal, round, and reactive to light.  Neck:     Thyroid : No thyromegaly.  Cardiovascular:     Rate and Rhythm: Normal rate and regular rhythm.  Pulmonary:     Effort: Pulmonary effort is normal. No respiratory distress.     Breath sounds: Normal breath sounds. No wheezing or rales.  Musculoskeletal:  Cervical back: Neck supple.     Right lower leg: No edema.     Left lower leg: No edema.  Neurological:     Mental Status: He is alert and oriented to person, place, and time.      Results for orders placed or performed in visit on 01/01/24  POC HgB A1c  Result Value Ref Range   Hemoglobin A1C 8.1 (A) 4.0 - 5.6 %   HbA1c POC (<> result, manual entry)     HbA1c, POC (prediabetic range)     HbA1c, POC (controlled diabetic range)        The ASCVD Risk score (Arnett DK, et al., 2019) failed to calculate for the following reasons:   Risk score cannot be calculated because patient has a medical history suggesting prior/existing ASCVD    Assessment & Plan:   #1 hypertension improved following recent addition of endralazine 25 mg twice daily.  Continue current regimen as above.  Continue low-sodium  diet.  Work on weight loss.  #2 type 2 diabetes.  Suboptimal control with A1c today 8.1%.  Discussed other options for patient.  He is reluctant to add additional medications.  Currently not taking Humalog  regularly.  We suggested that he at least add back 3 to 5 units with largest meal of day and continue current doses of Basaglar .  Reassess in 3 months  #3 hyperlipidemia.  Goal LDL less than 70.  Recent LDL 94.  Increased atorvastatin  at 40 mg daily and recheck fasting lipids at follow-up     Wolm Scarlet, MD

## 2024-01-01 NOTE — Patient Instructions (Signed)
 A1C today 8.1.    try to get back on the Humalog - 3 to 5 units with biggest meal of day  Increasing the Lipitor to 40 mg daily  Set up 3 month follow up.

## 2024-01-03 ENCOUNTER — Other Ambulatory Visit: Payer: Self-pay | Admitting: Family Medicine

## 2024-01-05 ENCOUNTER — Other Ambulatory Visit: Payer: Self-pay | Admitting: Family Medicine

## 2024-01-27 ENCOUNTER — Other Ambulatory Visit: Payer: Self-pay | Admitting: Family Medicine

## 2024-04-07 ENCOUNTER — Ambulatory Visit: Admitting: Family Medicine

## 2024-04-07 ENCOUNTER — Encounter: Payer: Self-pay | Admitting: Family Medicine

## 2024-04-07 ENCOUNTER — Ambulatory Visit: Payer: Self-pay | Admitting: Family Medicine

## 2024-04-07 VITALS — BP 130/80 | HR 77 | Temp 97.9°F | Wt 247.6 lb

## 2024-04-07 DIAGNOSIS — E1165 Type 2 diabetes mellitus with hyperglycemia: Secondary | ICD-10-CM

## 2024-04-07 DIAGNOSIS — N1832 Chronic kidney disease, stage 3b: Secondary | ICD-10-CM | POA: Diagnosis not present

## 2024-04-07 DIAGNOSIS — I10 Essential (primary) hypertension: Secondary | ICD-10-CM

## 2024-04-07 DIAGNOSIS — Z794 Long term (current) use of insulin: Secondary | ICD-10-CM | POA: Diagnosis not present

## 2024-04-07 LAB — BASIC METABOLIC PANEL WITH GFR
BUN: 41 mg/dL — ABNORMAL HIGH (ref 6–23)
CO2: 24 meq/L (ref 19–32)
Calcium: 9.2 mg/dL (ref 8.4–10.5)
Chloride: 109 meq/L (ref 96–112)
Creatinine, Ser: 2.49 mg/dL — ABNORMAL HIGH (ref 0.40–1.50)
GFR: 24.24 mL/min — ABNORMAL LOW
Glucose, Bld: 111 mg/dL — ABNORMAL HIGH (ref 70–99)
Potassium: 5.1 meq/L (ref 3.5–5.1)
Sodium: 138 meq/L (ref 135–145)

## 2024-04-07 LAB — POCT GLYCOSYLATED HEMOGLOBIN (HGB A1C): Hemoglobin A1C: 7.9 % — AB (ref 4.0–5.6)

## 2024-04-07 NOTE — Progress Notes (Signed)
 "  Established Patient Office Visit  Subjective   Patient ID: Carl Lam, male    DOB: [birth year]  Age: 78 y.o. MRN: [id number]  No chief complaint on file.   HPI    Mr. Neidig is seen for medical follow-up.  He has a history of hypertension, type 2 diabetes, hyperlipidemia, chronic kidney disease stage IIIb, history of CVA.  His diabetes was very poorly controlled last spring.  This has been steadily improving since then.  He takes Basaglar  regularly and we had also added Humalog  at meals but he is not taking that at all.  Not checking blood sugars regularly.  Denies any polyuria or polydipsia. No recent hypoglycemia symptoms.  Has not had eye exam and declines at this time  Hypertension currently treated with amlodipine  10 mg daily, chlorthalidone  25 mg daily, hydralazine  25 mg twice daily, and lisinopril  20 mg daily.  No recent dizziness.  No peripheral edema issues. Last GFR was 25.  He has been avoiding nonsteroidals.  Takes Tylenol  as needed for arthritis pains.  Past Medical History:  Diagnosis Date   Acute lower GI bleeding 10/11/2011, 12-16-2019   Anemia    BPH (benign prostatic hyperplasia)    Cataract    Colon polyp    CVA (cerebral vascular accident) (HCC) 12/14/2019   light stroke per pt gait is off a little per pt   DM type 2 (diabetes mellitus, type 2) (HCC) 10/27/2008   Dyspnea    sob with  heavy exertion   History of blood transfusion 12/16/2019   HYPERLIPIDEMIA 04/27/2009   Qualifier: Diagnosis of  By: Micheal MD, Ladarrious Kirksey     HYPERTENSION 10/27/2008   Qualifier: Diagnosis of  By: Lavinia LPN, Nancy     Intermittent self-catheterization of bladder    daily   Wears dentures    Wears glasses    Past Surgical History:  Procedure Laterality Date   BIOPSY  05/07/2022   Procedure: BIOPSY;  Surgeon: Wilhelmenia Aloha Raddle., MD;  Location: [facility];  Service: Gastroenterology;;   CATARACT EXTRACTION Bilateral    COLONOSCOPY  10/12/2011   Procedure:  COLONOSCOPY;  Surgeon: Princella CHRISTELLA Nida, MD;  Location: [facility]COPY;  Service: Endoscopy;  Laterality: N/A;   COLONOSCOPY WITH PROPOFOL  N/A 12/16/2019   Procedure: COLONOSCOPY WITH PROPOFOL ;  Surgeon: Aneita Gwendlyn DASEN, MD;  Location: [facility] ENDOSCOPY;  Service: Endoscopy;  Laterality: N/A;   ESOPHAGOGASTRODUODENOSCOPY  10/12/2011   Procedure: ESOPHAGOGASTRODUODENOSCOPY (EGD);  Surgeon: Princella CHRISTELLA Nida, MD;  Location: [facility] ENDOSCOPY;  Service: Endoscopy;  Laterality: N/A;   ESOPHAGOGASTRODUODENOSCOPY (EGD) WITH PROPOFOL  N/A 12/16/2019   Procedure: ESOPHAGOGASTRODUODENOSCOPY (EGD) WITH PROPOFOL ;  Surgeon: Aneita Gwendlyn DASEN, MD;  Location: [facility] ENDOSCOPY;  Service: Endoscopy;  Laterality: N/A;   ESOPHAGOGASTRODUODENOSCOPY (EGD) WITH PROPOFOL  N/A 05/12/2020   Procedure: ESOPHAGOGASTRODUODENOSCOPY (EGD) WITH PROPOFOL ;  Surgeon: Teressa Toribio SQUIBB, MD;  Location: [facility];  Service: Endoscopy;  Laterality: N/A;   ESOPHAGOGASTRODUODENOSCOPY (EGD) WITH PROPOFOL  N/A 10/05/2021   Procedure: ESOPHAGOGASTRODUODENOSCOPY (EGD) WITH PROPOFOL ;  Surgeon: Teressa Toribio SQUIBB, MD;  Location: [facility];  Service: Gastroenterology;  Laterality: N/A;   ESOPHAGOGASTRODUODENOSCOPY (EGD) WITH PROPOFOL  N/A 05/07/2022   Procedure: ESOPHAGOGASTRODUODENOSCOPY (EGD) WITH PROPOFOL ;  Surgeon: Wilhelmenia Aloha Raddle., MD;  Location: [facility];  Service: Gastroenterology;  Laterality: N/A;   EUS N/A 05/12/2020   Procedure: UPPER ENDOSCOPIC ULTRASOUND (EUS) RADIAL;  Surgeon: Teressa Toribio SQUIBB, MD;  Location: [facility];  Service: Endoscopy;  Laterality: N/A;   EUS N/A 10/05/2021   Procedure: UPPER ENDOSCOPIC ULTRASOUND (EUS)  RADIAL;  Surgeon: Teressa Toribio SQUIBB, MD;  Location: [facility];  Service: Gastroenterology;  Laterality: N/A;   EUS N/A 10/05/2021   Procedure: UPPER ENDOSCOPIC ULTRASOUND (EUS) LINEAR;  Surgeon: Teressa Toribio SQUIBB, MD;  Location: [facility];  Service: Gastroenterology;  Laterality: N/A;   EUS N/A 05/07/2022    Procedure: UPPER ENDOSCOPIC ULTRASOUND (EUS) RADIAL;  Surgeon: Wilhelmenia Aloha Raddle., MD;  Location: [facility];  Service: Gastroenterology;  Laterality: N/A;   FINE NEEDLE ASPIRATION N/A 10/05/2021   Procedure: FINE NEEDLE ASPIRATION (FNA) LINEAR;  Surgeon: Teressa Toribio SQUIBB, MD;  Location: [facility];  Service: Gastroenterology;  Laterality: N/A;   FINE NEEDLE ASPIRATION N/A 05/07/2022   Procedure: FINE NEEDLE ASPIRATION (FNA) LINEAR;  Surgeon: Wilhelmenia Aloha Raddle., MD;  Location: [facility];  Service: Gastroenterology;  Laterality: N/A;   HEMOSTASIS CLIP PLACEMENT  12/16/2019   Procedure: HEMOSTASIS CLIP PLACEMENT;  Surgeon: Aneita Gwendlyn DASEN, MD;  Location: [facility] ENDOSCOPY;  Service: Endoscopy;;   HERNIA REPAIR  2007   navel   JOINT REPLACEMENT     KNEE CARTILAGE SURGERY  1960's and 1970's   left; 2 total   POLYPECTOMY  12/16/2019   Procedure: POLYPECTOMY;  Surgeon: Aneita Gwendlyn DASEN, MD;  Location: [facility] ENDOSCOPY;  Service: Endoscopy;;   TOTAL HIP ARTHROPLASTY Right 2007   TRANSURETHRAL RESECTION OF PROSTATE N/A 06/27/2020   Procedure: TRANSURETHRAL RESECTION OF THE PROSTATE (TURP);  Surgeon: Carolee Sherwood JONETTA DOUGLAS, MD;  Location: [facility];  Service: Urology;  Laterality: N/A;  REQUESTING 84 MINS FOR CASE    reports that he quit smoking about 45 years ago. His smoking use included cigarettes. He started smoking about 60 years ago. He has a 4.5 pack-year smoking history. He has never used smokeless tobacco. He reports that he does not currently use alcohol. He reports current drug use. Drug: Marijuana. family history includes Alcohol abuse in an other family member; Arthritis in an other family member; Diabetes in an other family member. Allergies[1]  Review of Systems  Constitutional:  Negative for malaise/fatigue.  Eyes:  Negative for blurred vision.  Respiratory:  Negative for shortness of breath.   Cardiovascular:  Negative for chest pain.  Gastrointestinal:   Negative for abdominal pain.  Neurological:  Negative for dizziness, weakness and headaches.      Objective:     BP 130/80 (BP Location: Left Arm, Cuff Size: Normal)   Pulse 77   Temp 97.9 F (36.6 C) (Oral)   Wt 247 lb 9.6 oz (112.3 kg)   SpO2 97%   BMI 31.79 kg/m  BP Readings from Last 3 Encounters:  04/07/24 130/80  01/01/24 134/80  10/01/23 (!) 160/90   Wt Readings from Last 3 Encounters:  04/07/24 247 lb 9.6 oz (112.3 kg)  01/01/24 248 lb 14.4 oz (112.9 kg)  10/01/23 246 lb 1.6 oz (111.6 kg)      Physical Exam Vitals reviewed.  Constitutional:      Appearance: He is well-developed.  Eyes:     Pupils: Pupils are equal, round, and reactive to light.  Neck:     Thyroid : No thyromegaly.  Cardiovascular:     Rate and Rhythm: Normal rate and regular rhythm.  Pulmonary:     Effort: Pulmonary effort is normal. No respiratory distress.     Breath sounds: Normal breath sounds. No wheezing or rales.  Musculoskeletal:     Cervical back: Neck supple.     Right lower leg: No edema.     Left lower leg: No  edema.  Neurological:     Mental Status: He is alert and oriented to person, place, and time.      Results for orders placed or performed in visit on 04/07/24  POC HgB A1c  Result Value Ref Range   Hemoglobin A1C 7.9 (A) 4.0 - 5.6 %   HbA1c POC (<> result, manual entry)     HbA1c, POC (prediabetic range)     HbA1c, POC (controlled diabetic range)      Last CBC Lab Results  Component Value Date   WBC 5.1 10/01/2023   HGB 12.1 (L) 10/01/2023   HCT 36.5 (L) 10/01/2023   MCV 89.7 10/01/2023   MCH 28.2 06/23/2020   RDW 14.6 10/01/2023   PLT 257.0 10/01/2023   Last metabolic panel Lab Results  Component Value Date   GLUCOSE 109 (H) 10/01/2023   NA 139 10/01/2023   K 5.1 10/01/2023   CL 108 10/01/2023   CO2 26 10/01/2023   BUN 36 (H) 10/01/2023   CREATININE 2.42 (H) 10/01/2023   GFR 25.17 (L) 10/01/2023   CALCIUM  8.9 10/01/2023   PROT 7.4 10/01/2023    ALBUMIN 4.2 10/01/2023   BILITOT 0.4 10/01/2023   ALKPHOS 53 10/01/2023   AST 11 10/01/2023   ALT 9 10/01/2023   ANIONGAP 7 06/23/2020   Last lipids Lab Results  Component Value Date   CHOL 149 10/01/2023   HDL 42.50 10/01/2023   LDLCALC 94 10/01/2023   LDLDIRECT 168.2 07/11/2012   TRIG 65.0 10/01/2023   CHOLHDL 4 10/01/2023   Last hemoglobin A1c Lab Results  Component Value Date   HGBA1C 7.9 (A) 04/07/2024      The ASCVD Risk score (Arnett DK, et al., 2019) failed to calculate for the following reasons:   Risk score cannot be calculated because patient has a medical history suggesting prior/existing ASCVD   * - Cholesterol units were assumed    Assessment & Plan:   #1 type 2 diabetes.  A1c slightly improved to 7.9%.  Patient remains reluctant to add additional medications.  He is on Basaglar  40 units once daily.  Has declined mealtime use of Humalog  as we had suggested.  He prefers to give this 3-4 more months of additional diet and exercise.  Recheck A1c in 4 months.  Strongly recommend diabetic eye exam but he declines at this time.  #2 hypertension.  Initial reading was up but improved to 130/80 after rest.  Continue 4 drug regimen as above.  #3 chronic kidney disease stage IIIb.  Again reminded to avoid nonsteroidals and stay well-hydrated.  Recheck basic metabolic panel today  #4 health maintenance.  Strongly suggested pneumonia vaccine as well as flu vaccine.  He declines both   Return in about 4 months (around 08/06/2024).    Wolm Scarlet, MD     [1] No Known Allergies  "

## 2024-04-11 ENCOUNTER — Other Ambulatory Visit: Payer: Self-pay | Admitting: Family Medicine

## 2024-04-30 ENCOUNTER — Other Ambulatory Visit: Payer: Self-pay | Admitting: Family Medicine

## 2024-08-11 ENCOUNTER — Ambulatory Visit: Admitting: Family Medicine
